# Patient Record
Sex: Female | Born: 1950
Health system: Southern US, Community
[De-identification: ages and names within clinical notes are randomized; demographics above are authoritative.]

## PROBLEM LIST (undated history)

## (undated) DIAGNOSIS — I10 Essential (primary) hypertension: Secondary | ICD-10-CM

## (undated) DIAGNOSIS — K219 Gastro-esophageal reflux disease without esophagitis: Secondary | ICD-10-CM

## (undated) DIAGNOSIS — E785 Hyperlipidemia, unspecified: Secondary | ICD-10-CM

## (undated) DIAGNOSIS — M1990 Unspecified osteoarthritis, unspecified site: Secondary | ICD-10-CM

## (undated) HISTORY — DX: Gastro-esophageal reflux disease without esophagitis: K21.9

## (undated) HISTORY — PX: BILATERAL OOPHORECTOMY: SHX1221

## (undated) HISTORY — PX: APPENDECTOMY: SHX54

## (undated) HISTORY — DX: Essential (primary) hypertension: I10

## (undated) HISTORY — DX: Hyperlipidemia, unspecified: E78.5

## (undated) HISTORY — DX: Unspecified osteoarthritis, unspecified site: M19.90

## (undated) HISTORY — PX: BUNIONECTOMY: SHX129

## (undated) HISTORY — PX: CARPAL TUNNEL RELEASE: SHX101

---

## 1993-12-09 HISTORY — PX: ABDOMINAL HYSTERECTOMY: SHX81

## 2000-07-17 ENCOUNTER — Encounter: Admission: RE | Admit: 2000-07-17 | Discharge: 2000-07-17 | Payer: Self-pay | Admitting: Gynecology

## 2000-07-17 ENCOUNTER — Encounter: Payer: Self-pay | Admitting: Gynecology

## 2000-07-24 ENCOUNTER — Encounter: Admission: RE | Admit: 2000-07-24 | Discharge: 2000-07-24 | Payer: Self-pay | Admitting: Gynecology

## 2000-07-24 ENCOUNTER — Encounter: Payer: Self-pay | Admitting: Gynecology

## 2001-05-09 ENCOUNTER — Encounter (INDEPENDENT_AMBULATORY_CARE_PROVIDER_SITE_OTHER): Payer: Self-pay | Admitting: Internal Medicine

## 2001-09-24 ENCOUNTER — Other Ambulatory Visit: Admission: RE | Admit: 2001-09-24 | Discharge: 2001-09-24 | Payer: Self-pay | Admitting: Gynecology

## 2001-12-23 ENCOUNTER — Encounter: Payer: Self-pay | Admitting: Gynecology

## 2001-12-23 ENCOUNTER — Encounter: Admission: RE | Admit: 2001-12-23 | Discharge: 2001-12-23 | Payer: Self-pay | Admitting: Gynecology

## 2002-07-28 ENCOUNTER — Encounter: Payer: Self-pay | Admitting: Family Medicine

## 2002-07-28 ENCOUNTER — Encounter: Admission: RE | Admit: 2002-07-28 | Discharge: 2002-07-28 | Payer: Self-pay | Admitting: Family Medicine

## 2002-09-28 ENCOUNTER — Other Ambulatory Visit: Admission: RE | Admit: 2002-09-28 | Discharge: 2002-09-28 | Payer: Self-pay | Admitting: Gynecology

## 2003-03-01 ENCOUNTER — Encounter: Payer: Self-pay | Admitting: Gynecology

## 2003-03-01 ENCOUNTER — Encounter: Admission: RE | Admit: 2003-03-01 | Discharge: 2003-03-01 | Payer: Self-pay | Admitting: Gynecology

## 2003-10-27 ENCOUNTER — Other Ambulatory Visit: Admission: RE | Admit: 2003-10-27 | Discharge: 2003-10-27 | Payer: Self-pay | Admitting: Gynecology

## 2003-11-01 ENCOUNTER — Ambulatory Visit (HOSPITAL_COMMUNITY): Admission: RE | Admit: 2003-11-01 | Discharge: 2003-11-01 | Payer: Self-pay | Admitting: Internal Medicine

## 2004-03-06 ENCOUNTER — Encounter: Admission: RE | Admit: 2004-03-06 | Discharge: 2004-03-06 | Payer: Self-pay | Admitting: Gynecology

## 2004-12-09 DIAGNOSIS — K219 Gastro-esophageal reflux disease without esophagitis: Secondary | ICD-10-CM | POA: Insufficient documentation

## 2004-12-25 ENCOUNTER — Other Ambulatory Visit: Admission: RE | Admit: 2004-12-25 | Discharge: 2004-12-25 | Payer: Self-pay | Admitting: Gynecology

## 2005-01-21 ENCOUNTER — Ambulatory Visit: Payer: Self-pay | Admitting: Family Medicine

## 2005-02-06 ENCOUNTER — Ambulatory Visit: Payer: Self-pay | Admitting: Family Medicine

## 2005-03-05 ENCOUNTER — Ambulatory Visit: Payer: Self-pay | Admitting: Family Medicine

## 2005-05-09 ENCOUNTER — Ambulatory Visit: Payer: Self-pay | Admitting: Family Medicine

## 2005-05-09 DIAGNOSIS — E1122 Type 2 diabetes mellitus with diabetic chronic kidney disease: Secondary | ICD-10-CM | POA: Insufficient documentation

## 2005-05-09 DIAGNOSIS — N186 End stage renal disease: Secondary | ICD-10-CM

## 2005-05-24 ENCOUNTER — Encounter (INDEPENDENT_AMBULATORY_CARE_PROVIDER_SITE_OTHER): Payer: Self-pay | Admitting: Nurse Practitioner

## 2005-06-19 ENCOUNTER — Encounter: Admission: RE | Admit: 2005-06-19 | Discharge: 2005-06-19 | Payer: Self-pay | Admitting: Otolaryngology

## 2005-07-10 ENCOUNTER — Encounter (INDEPENDENT_AMBULATORY_CARE_PROVIDER_SITE_OTHER): Payer: Self-pay | Admitting: *Deleted

## 2005-07-10 ENCOUNTER — Encounter: Admission: RE | Admit: 2005-07-10 | Discharge: 2005-07-10 | Payer: Self-pay | Admitting: Otolaryngology

## 2005-07-10 ENCOUNTER — Other Ambulatory Visit: Admission: RE | Admit: 2005-07-10 | Discharge: 2005-07-10 | Payer: Self-pay | Admitting: Interventional Radiology

## 2005-10-24 ENCOUNTER — Encounter: Admission: RE | Admit: 2005-10-24 | Discharge: 2005-10-24 | Payer: Self-pay | Admitting: Gynecology

## 2005-11-22 ENCOUNTER — Ambulatory Visit: Payer: Self-pay | Admitting: Internal Medicine

## 2006-03-21 ENCOUNTER — Encounter: Admission: RE | Admit: 2006-03-21 | Discharge: 2006-03-21 | Payer: Self-pay | Admitting: Otolaryngology

## 2006-05-03 ENCOUNTER — Encounter (INDEPENDENT_AMBULATORY_CARE_PROVIDER_SITE_OTHER): Payer: Self-pay | Admitting: Specialist

## 2006-05-03 ENCOUNTER — Observation Stay (HOSPITAL_COMMUNITY): Admission: EM | Admit: 2006-05-03 | Discharge: 2006-05-04 | Payer: Self-pay | Admitting: Emergency Medicine

## 2006-08-29 ENCOUNTER — Encounter: Admission: RE | Admit: 2006-08-29 | Discharge: 2006-08-29 | Payer: Self-pay | Admitting: Endocrinology

## 2006-10-15 ENCOUNTER — Other Ambulatory Visit: Admission: RE | Admit: 2006-10-15 | Discharge: 2006-10-15 | Payer: Self-pay | Admitting: Gynecology

## 2006-11-11 ENCOUNTER — Encounter: Admission: RE | Admit: 2006-11-11 | Discharge: 2006-11-11 | Payer: Self-pay | Admitting: Gynecology

## 2007-03-04 ENCOUNTER — Encounter: Admission: RE | Admit: 2007-03-04 | Discharge: 2007-03-04 | Payer: Self-pay | Admitting: Otolaryngology

## 2007-07-27 ENCOUNTER — Encounter: Payer: Self-pay | Admitting: Internal Medicine

## 2007-07-27 DIAGNOSIS — E785 Hyperlipidemia, unspecified: Secondary | ICD-10-CM

## 2007-07-27 DIAGNOSIS — D126 Benign neoplasm of colon, unspecified: Secondary | ICD-10-CM | POA: Insufficient documentation

## 2007-07-27 DIAGNOSIS — E1169 Type 2 diabetes mellitus with other specified complication: Secondary | ICD-10-CM | POA: Insufficient documentation

## 2007-07-27 DIAGNOSIS — D649 Anemia, unspecified: Secondary | ICD-10-CM | POA: Insufficient documentation

## 2007-08-06 ENCOUNTER — Ambulatory Visit: Payer: Self-pay | Admitting: Family Medicine

## 2007-08-06 DIAGNOSIS — R5381 Other malaise: Secondary | ICD-10-CM | POA: Insufficient documentation

## 2007-08-06 DIAGNOSIS — R5383 Other fatigue: Secondary | ICD-10-CM | POA: Insufficient documentation

## 2007-08-06 DIAGNOSIS — E041 Nontoxic single thyroid nodule: Secondary | ICD-10-CM | POA: Insufficient documentation

## 2007-08-07 LAB — CONVERTED CEMR LAB
ALT: 39 units/L — ABNORMAL HIGH (ref 0–35)
AST: 37 units/L (ref 0–37)
BUN: 12 mg/dL (ref 6–23)
Basophils Absolute: 0.1 10*3/uL (ref 0.0–0.1)
Basophils Relative: 1 % (ref 0.0–1.0)
Chloride: 102 meq/L (ref 96–112)
Direct LDL: 162.4 mg/dL
Eosinophils Relative: 2.5 % (ref 0.0–5.0)
GFR calc Af Amer: 74 mL/min
GFR calc non Af Amer: 61 mL/min
Glucose, Bld: 109 mg/dL — ABNORMAL HIGH (ref 70–99)
Lymphocytes Relative: 41.2 % (ref 12.0–46.0)
MCHC: 34.5 g/dL (ref 30.0–36.0)
Monocytes Absolute: 0.5 10*3/uL (ref 0.2–0.7)
Monocytes Relative: 7.5 % (ref 3.0–11.0)
Neutro Abs: 3.1 10*3/uL (ref 1.4–7.7)
Potassium: 3.8 meq/L (ref 3.5–5.1)
RBC: 4.31 M/uL (ref 3.87–5.11)
RDW: 13 % (ref 11.5–14.6)
Total Bilirubin: 0.8 mg/dL (ref 0.3–1.2)

## 2007-08-13 ENCOUNTER — Ambulatory Visit: Payer: Self-pay | Admitting: Family Medicine

## 2007-08-25 ENCOUNTER — Encounter (INDEPENDENT_AMBULATORY_CARE_PROVIDER_SITE_OTHER): Payer: Self-pay | Admitting: Internal Medicine

## 2007-09-07 ENCOUNTER — Telehealth (INDEPENDENT_AMBULATORY_CARE_PROVIDER_SITE_OTHER): Payer: Self-pay | Admitting: *Deleted

## 2007-10-01 ENCOUNTER — Ambulatory Visit: Payer: Self-pay | Admitting: Family Medicine

## 2007-10-02 ENCOUNTER — Telehealth (INDEPENDENT_AMBULATORY_CARE_PROVIDER_SITE_OTHER): Payer: Self-pay | Admitting: Internal Medicine

## 2007-10-02 LAB — CONVERTED CEMR LAB
AST: 30 units/L (ref 0–37)
Cholesterol: 181 mg/dL (ref 0–200)
Hgb A1c MFr Bld: 6.5 % — ABNORMAL HIGH (ref 4.6–6.0)
LDL Cholesterol: 106 mg/dL — ABNORMAL HIGH (ref 0–99)
Total CHOL/HDL Ratio: 3.9
Triglycerides: 138 mg/dL (ref 0–149)
VLDL: 28 mg/dL (ref 0–40)

## 2007-10-13 ENCOUNTER — Ambulatory Visit: Payer: Self-pay | Admitting: Family Medicine

## 2007-10-16 ENCOUNTER — Encounter (INDEPENDENT_AMBULATORY_CARE_PROVIDER_SITE_OTHER): Payer: Self-pay | Admitting: Internal Medicine

## 2008-01-05 ENCOUNTER — Ambulatory Visit: Payer: Self-pay | Admitting: Family Medicine

## 2008-01-12 ENCOUNTER — Encounter (INDEPENDENT_AMBULATORY_CARE_PROVIDER_SITE_OTHER): Payer: Self-pay | Admitting: Internal Medicine

## 2008-01-12 LAB — CONVERTED CEMR LAB
Cholesterol: 163 mg/dL (ref 0–200)
HDL: 46.9 mg/dL (ref >39.0)

## 2008-01-15 ENCOUNTER — Encounter: Admission: RE | Admit: 2008-01-15 | Discharge: 2008-01-15 | Payer: Self-pay | Admitting: Otolaryngology

## 2008-01-19 ENCOUNTER — Encounter: Admission: RE | Admit: 2008-01-19 | Discharge: 2008-01-19 | Payer: Self-pay | Admitting: Gynecology

## 2008-04-12 ENCOUNTER — Ambulatory Visit: Payer: Self-pay | Admitting: Family Medicine

## 2008-04-13 LAB — CONVERTED CEMR LAB
ALT: 33 units/L (ref 0–35)
Cholesterol: 154 mg/dL (ref 0–200)
Total CHOL/HDL Ratio: 3.5
VLDL: 24 mg/dL (ref 0–40)

## 2008-06-01 ENCOUNTER — Encounter (INDEPENDENT_AMBULATORY_CARE_PROVIDER_SITE_OTHER): Payer: Self-pay | Admitting: Internal Medicine

## 2008-07-26 ENCOUNTER — Encounter: Admission: RE | Admit: 2008-07-26 | Discharge: 2008-07-26 | Payer: Self-pay | Admitting: Otolaryngology

## 2008-10-11 ENCOUNTER — Ambulatory Visit: Payer: Self-pay | Admitting: Family Medicine

## 2008-10-13 LAB — CONVERTED CEMR LAB
ALT: 26 units/L (ref 0–35)
Bilirubin, Direct: 0.1 mg/dL (ref 0.0–0.3)
Cholesterol: 160 mg/dL (ref 0–200)
HDL: 47.3 mg/dL (ref >39.0)
LDL Cholesterol: 88 mg/dL (ref 0–99)
Total Bilirubin: 0.7 mg/dL (ref 0.3–1.2)
Triglycerides: 125 mg/dL (ref 0–149)

## 2008-12-20 ENCOUNTER — Encounter (INDEPENDENT_AMBULATORY_CARE_PROVIDER_SITE_OTHER): Payer: Self-pay | Admitting: Internal Medicine

## 2009-01-19 ENCOUNTER — Encounter: Admission: RE | Admit: 2009-01-19 | Discharge: 2009-01-19 | Payer: Self-pay | Admitting: Gynecology

## 2009-01-25 ENCOUNTER — Telehealth (INDEPENDENT_AMBULATORY_CARE_PROVIDER_SITE_OTHER): Payer: Self-pay | Admitting: *Deleted

## 2009-04-14 ENCOUNTER — Ambulatory Visit: Payer: Self-pay | Admitting: Family Medicine

## 2009-04-18 LAB — CONVERTED CEMR LAB
AST: 27 units/L (ref 0–37)
Cholesterol: 156 mg/dL (ref 0–200)
HDL: 47.5 mg/dL (ref >39.00)
LDL Cholesterol: 89 mg/dL (ref 0–99)
Total CHOL/HDL Ratio: 3
Triglycerides: 99 mg/dL (ref 0.0–149.0)
VLDL: 19.8 mg/dL (ref 0.0–40.0)

## 2009-04-20 ENCOUNTER — Telehealth (INDEPENDENT_AMBULATORY_CARE_PROVIDER_SITE_OTHER): Payer: Self-pay | Admitting: *Deleted

## 2009-04-26 ENCOUNTER — Telehealth (INDEPENDENT_AMBULATORY_CARE_PROVIDER_SITE_OTHER): Payer: Self-pay | Admitting: *Deleted

## 2009-04-28 ENCOUNTER — Encounter (INDEPENDENT_AMBULATORY_CARE_PROVIDER_SITE_OTHER): Payer: Self-pay | Admitting: Internal Medicine

## 2009-05-10 ENCOUNTER — Telehealth (INDEPENDENT_AMBULATORY_CARE_PROVIDER_SITE_OTHER): Payer: Self-pay | Admitting: *Deleted

## 2009-05-16 ENCOUNTER — Telehealth (INDEPENDENT_AMBULATORY_CARE_PROVIDER_SITE_OTHER): Payer: Self-pay | Admitting: *Deleted

## 2009-05-22 ENCOUNTER — Telehealth (INDEPENDENT_AMBULATORY_CARE_PROVIDER_SITE_OTHER): Payer: Self-pay | Admitting: *Deleted

## 2009-06-19 ENCOUNTER — Encounter: Admission: RE | Admit: 2009-06-19 | Discharge: 2009-06-19 | Payer: Self-pay | Admitting: Internal Medicine

## 2009-06-21 ENCOUNTER — Telehealth: Payer: Self-pay | Admitting: Family Medicine

## 2009-09-11 ENCOUNTER — Encounter: Admission: RE | Admit: 2009-09-11 | Discharge: 2009-09-11 | Payer: Self-pay | Admitting: Otolaryngology

## 2009-10-27 ENCOUNTER — Telehealth (INDEPENDENT_AMBULATORY_CARE_PROVIDER_SITE_OTHER): Payer: Self-pay | Admitting: Internal Medicine

## 2010-05-17 ENCOUNTER — Encounter: Admission: RE | Admit: 2010-05-17 | Discharge: 2010-05-17 | Payer: Self-pay | Admitting: Gynecology

## 2010-05-29 LAB — HM DEXA SCAN

## 2010-12-30 ENCOUNTER — Encounter: Payer: Self-pay | Admitting: Endocrinology

## 2011-04-26 NOTE — Op Note (Signed)
NAMEMarland Kitchen  TAKEYSHA, BONK NO.:  [id number]   MEDICAL RECORD NO.:  [id number]          PATIENT TYPE:  INP   LOCATION:  0102                         FACILITY:  [facility]   PHYSICIAN:  Sandria Bales. Ezzard Standing, M.D.  DATE OF BIRTH:  [birth year]   DATE OF PROCEDURE:  05/03/2006  DATE OF DISCHARGE:                                 OPERATIVE REPORT   PREOPERATIVE DIAGNOSIS:  Appendicitis.   POSTOPERATIVE DIAGNOSIS:  Gangrenous appendicitis.   OPERATION/PROCEDURE:  Laparoscopic appendectomy.   SURGEON:  Sandria Bales. Ezzard Standing, M.D.   FIRST ASSISTANT:  None.   ANESTHESIA:  General endotracheal anesthesia with about 12 mL of 1%  Xylocaine.   COMPLICATIONS:  None.   INDICATIONS FOR PROCEDURE:  [name] is a 60 year old white female who  presented with about a 24-hour history of worsening abdominal pain which was  localized to the right lower quadrant.  CT scan consistent with acute  appendicitis as were her physical exam and labs.  She now comes for a  attempted laparoscopic appendectomy.   She understands the indications and potential risks.  Potential risks  include but are not limited to bleeding, infection, bowel injury, possible  open surgery, and abscess formation later.   DESCRIPTION OF PROCEDURE:  The patient had her left arm tucked and the right  arm out to her side, Foley catheter in place.  Was given a gram of cefoxitin  at the initiation of the procedure.  Her abdomen was prepped with Betadine  solution and sterilely draped.   A 10 mm 0-degree laparoscope was inserted through a 12 mm Hasson trocar in  the infraumbilical incision.  The Hasson trocar was secured with a 0 Vicryl  suture.  A 5 mm Ethicon  trocar was placed in the right upper quadrant and  11 mm [employer]  trocar placed in the left lower quadrant.  The  appendix was identified.  It was stuck up along the right lower quadrant and  was gangrenous and dead_.  The appendix was freed from the abdominal wall.  The mesentery of the appendix taken down with the Harmonic scalpel.  I then  used a endo-GIA 45 mm white load of the stapler which was fired across the  base of the appendix.  The appendix was then delivered into a EndoCatch bag  and delivered through the umbilicus and sent to pathology;   I then reinspected the appendiceal stump which looked good.  I irrigated the  abdomen with about 500 mL of saline, both in the pelvis and on the right  colonic gutter over the liver.  There was no residual purulent material  noted.   I then removed the trocars and determined there was no bleeding at any  trocar site.  The umbilicus trocar was closed with a 0 Vicryl suture.  The  skin at each site was closed with a 5-0 Monocryl suture, painted with  tincture of Benzoin and Steri-stripped.  I used about 12 mL of 0.25%  Marcaine at each trocar site.   The patient tolerated the procedure well and was transported to recovery in  good condition.  Sponge and needle counts were correct.      Sandria Bales. Ezzard Standing, M.D.  Electronically Signed     DHN/MEDQ  D:  05/03/2006  T:  05/05/2006  Job:  [id number]   cc:   Marne A. Tower, M.D. [facility]  [address] [place]  [place] [address]

## 2011-04-26 NOTE — H&P (Signed)
NAME:  Sara Mills, Sara Mills NO.:  [id number]   MEDICAL RECORD NO.:  [id number]          PATIENT TYPE:  EMS   LOCATION:  ED                           FACILITY:  [facility]   PHYSICIAN:  Sandria Bales. Ezzard Standing, M.D.  DATE OF BIRTH:  [birth year]   DATE OF ADMISSION:  05/03/2006  DATE OF DISCHARGE:                                HISTORY & PHYSICAL   HISTORY:  This is a 60 year old white female who has no real significant  past GI history.  She denies history of peptic ulcer disease, liver disease,  gallbladder disease, pancreatic disease.  She had a negative colonoscopy in  2006 by Dr. Ranae Palms.  She has had nausea for about 3 or 4 days, but then  last evening started having abdominal pain which increased and located to  the right lower quadrant.  Came to the [facility] Emergency Room.  She was  seen by Dr. Judithann Sauger who obtained a CT scan which is consistent with  appendicitis.   PAST MEDICAL HISTORY:  She has no allergies.   She is on no medications.   PRIOR OPERATIONS:  1.  She has had 2 C sections and then had a hysterectomy by Dr.  Esperanza Richters in 1995.  2.  She has had carpal tunnel of her right hand by Dr.  Onalee Hua in 2006.  3.  She has had bunions removed from both feet by Dr. Lestine Box in 2004.   REVIEW OF SYSTEMS:  NEUROLOGIC: No history of seizures or loss of  consciousness.  PULMONARY: Denies history of  pneumonia, tuberculosis.  CARDIAC: No heart disease, chest pain, or hypertension.  GASTROINTESTINAL:  See History of Present Illness.  UROLOGIC: No history of kidney stones or kidney infections.   She is accompanied by her husband and daughters in the emergency room.  She  works at [employer] as a Solicitor, I think.   PHYSICAL EXAMINATION:  VITAL SIGNS:  Temperature 97.4, pulse 70, blood  pressure 118/60, respirations 18.  GENERAL: She is a well-nourished, pleasant white female, alert and  cooperative on physical exam.  HEENT:  Unremarkable.  NECK:  Supple.  I feel no mass or thyromegaly, though she says she has a  history of thyroid nodule that has been examined by ultrasound, and I think  the plan is observation.  LUNGS: Clear to auscultation.  HEART: Regular rate and rhythm.  I hear no murmur or rub.  ABDOMEN:  She has bowel sounds, but she has tenderness, guarding, and  rebound in her right lower quadrant suprapubic area.  RECTAL:  I did not do a rectal exam on her.  EXTREMITIES:  She has goo strength to all four extremities.  NEUROLOGIC: Grossly intact to motor and sensory function.   LABORATORY DATA:  Show normal urinalysis.  Sodium 138, potassium 3.3,  chloride 105.  Liver function: Lipase 24. White blood count is 20,300,  hemoglobin 12.7, hematocrit 37.5.   Review of her CT scan shows dilated appendix with periappendiceal  measurement consistent with acute appendicitis.  She has no other  obvious  intra-abdominal problem.   DIAGNOSES:  1.  Acute appendicitis.  Discussed with the patient and her family about      proceeding with appendectomy.  I can usually do about 80% of these      laparoscopically.  The possibility of complications would be open      surgery, bleeding, infection, and a need to resect more than just      appendix as far as bowel.  2.  Thyroid nodules with plan for observation.  3.  History of wrist and foot surgery.  4.  History of hysterectomy.      Sandria Bales. Ezzard Standing, M.D.  Electronically Signed     DHN/MEDQ  D:  05/03/2006  T:  05/03/2006  Job:  [id number]   cc:   Marne A. Tower, M.D. [facility]  [address], [place]  [place] [address]   Bilie Jillyn Hidden, P.A.   Anselmo Rod, M.D.  Fax: [phone]

## 2011-08-29 ENCOUNTER — Other Ambulatory Visit: Payer: Self-pay | Admitting: Gynecology

## 2011-09-04 ENCOUNTER — Other Ambulatory Visit: Payer: Self-pay | Admitting: Gynecology

## 2011-09-04 DIAGNOSIS — Z1231 Encounter for screening mammogram for malignant neoplasm of breast: Secondary | ICD-10-CM

## 2011-09-18 ENCOUNTER — Ambulatory Visit: Payer: Self-pay

## 2011-09-20 LAB — HM MAMMOGRAPHY: HM Mammogram: NORMAL

## 2011-10-01 ENCOUNTER — Ambulatory Visit: Payer: Self-pay

## 2012-03-10 ENCOUNTER — Ambulatory Visit: Payer: Self-pay | Admitting: Internal Medicine

## 2012-03-20 ENCOUNTER — Ambulatory Visit (INDEPENDENT_AMBULATORY_CARE_PROVIDER_SITE_OTHER): Payer: 59 | Admitting: Internal Medicine

## 2012-03-20 ENCOUNTER — Encounter: Payer: Self-pay | Admitting: Internal Medicine

## 2012-03-20 ENCOUNTER — Ambulatory Visit: Payer: Self-pay | Admitting: Internal Medicine

## 2012-03-20 VITALS — BP 130/64 | HR 69 | Temp 98.2°F | Resp 16 | Ht 61.5 in | Wt 172.2 lb

## 2012-03-20 DIAGNOSIS — E28319 Asymptomatic premature menopause: Secondary | ICD-10-CM

## 2012-03-20 DIAGNOSIS — E669 Obesity, unspecified: Secondary | ICD-10-CM

## 2012-03-20 DIAGNOSIS — E785 Hyperlipidemia, unspecified: Secondary | ICD-10-CM

## 2012-03-20 DIAGNOSIS — E119 Type 2 diabetes mellitus without complications: Secondary | ICD-10-CM

## 2012-03-20 DIAGNOSIS — I152 Hypertension secondary to endocrine disorders: Secondary | ICD-10-CM | POA: Insufficient documentation

## 2012-03-20 DIAGNOSIS — Z79890 Hormone replacement therapy (postmenopausal): Secondary | ICD-10-CM

## 2012-03-20 DIAGNOSIS — I10 Essential (primary) hypertension: Secondary | ICD-10-CM

## 2012-03-20 DIAGNOSIS — K219 Gastro-esophageal reflux disease without esophagitis: Secondary | ICD-10-CM | POA: Insufficient documentation

## 2012-03-20 DIAGNOSIS — E041 Nontoxic single thyroid nodule: Secondary | ICD-10-CM

## 2012-03-20 NOTE — Patient Instructions (Addendum)
Consider the Low Glycemic Index Diet and 6 smaller meals daily :   7 AM Low carbohydrate Protein  Shakes (EAS Carb Control  Or Atkins ,  Available everywhere,   In  cases at BJs )  2.5 carbs  (Add or substitute a toasted Arnold's sandwhich thin w/ peanut butter)  10 AM: Protein bar by Atkins (snack size,  Chocolate lover's variety at  BJ's)    Lunch: sandwich on pita bread or flatbread (Joseph's makes a pita bread and a flat bread , available at [employer] and [employer]J's; Toufayah makes a low carb flatbread available at [employer] and [employer]) [employer] and [employer] makes a low carb whole wheat tortilla,  Available everywhere  3 PM:  Mid day :  Another protein bar,  Or a  cheese stick, 1/4 cup of almonds, walnuts, pistachios, pecans, peanuts,  Macadamia nuts  6 PM  Dinner:  "mean and green:"  Meat/chicken/fish, salad, and green veggie : use ranch, vinagrette,  Blue cheese, etc  9 PM snack : Breyer's low carb fudgsicle or  ice cream bar (Carb Smart) Weight Watcher's ice cream bar , or another protein shake  YOU NEED TO TAKE A BABY ASPIRIN  DAILY

## 2012-03-20 NOTE — Assessment & Plan Note (Signed)
Managed with zegerid twice daily initially, now once daily.

## 2012-03-20 NOTE — Progress Notes (Signed)
Patient ID: Sara Mills, female   DOB: 1951/01/05, 61 y.o.   MRN: [id number]   To establish primary care. She is tansferring from [facility], and was last seen in  January . Concern today is polypharmacy and would like to reduce her medications. She denies any intolerance to medications and does have several chronic diseases including high blood pressure high cholesterol gastroesophageal reflux diabetes mellitus depression hypothyroidism and arthritis. Her hemoglobin A1c was 5.9 in January her fasting glucose was 94. She had a fasting lipid panel also done at that time with an LDL of 55 triglycerides of 107 HDL 33.

## 2012-03-20 NOTE — Assessment & Plan Note (Addendum)
hgba1c 5.9 and she has gained weight despite being on metformin.  She is also reporting multiple  GI side  Effects including bloating. We we discussed  suspending metformin and following her symptoms and her weight. Do in the safety issues with him in [place] we will discuss stopping Actos at her next visit and concerned one of the new agents such as Tradjenta if needed.

## 2012-03-22 DIAGNOSIS — E663 Overweight: Secondary | ICD-10-CM | POA: Insufficient documentation

## 2012-03-22 NOTE — Assessment & Plan Note (Signed)
Her hypertension is well controlled on current medications and she has normal renal function by January 2013 labs. No changes today.

## 2012-03-22 NOTE — Assessment & Plan Note (Signed)
She has a history of intolerance due to failed amelioration of undesired symptoms on all but oral medications. Her quality of life is miserable without hormone therapy. We discussed the risks and benefits of hormone therapy and she is willing to continue using estradiol but will start taking an aspirin daily.

## 2012-03-22 NOTE — Assessment & Plan Note (Addendum)
Well controlled, with LDL less than 70, on Crestor and Zetia.[name] No changes today

## 2012-03-22 NOTE — Assessment & Plan Note (Signed)
She was followed by Cain Saupe of [facility] but has not seen an ENT doctor or had her thyroid nodules imaged over one year. Referral done today.

## 2012-03-22 NOTE — Assessment & Plan Note (Signed)
I have addressed  BMI and recommended a low glycemic index diet utilizing smaller more frequent meals to increase metabolism.  I have also recommended that patient start exercising with a goal of 30 minutes of aerobic exercise a minimum of 5 days per week.  

## 2012-04-21 ENCOUNTER — Ambulatory Visit (INDEPENDENT_AMBULATORY_CARE_PROVIDER_SITE_OTHER): Payer: 59 | Admitting: Internal Medicine

## 2012-04-21 ENCOUNTER — Encounter: Payer: Self-pay | Admitting: Internal Medicine

## 2012-04-21 VITALS — BP 124/68 | HR 65 | Temp 98.4°F | Resp 16 | Wt 174.0 lb

## 2012-04-21 DIAGNOSIS — E119 Type 2 diabetes mellitus without complications: Secondary | ICD-10-CM

## 2012-04-21 DIAGNOSIS — N058 Unspecified nephritic syndrome with other morphologic changes: Secondary | ICD-10-CM

## 2012-04-21 DIAGNOSIS — E1122 Type 2 diabetes mellitus with diabetic chronic kidney disease: Secondary | ICD-10-CM

## 2012-04-21 DIAGNOSIS — R053 Chronic cough: Secondary | ICD-10-CM | POA: Insufficient documentation

## 2012-04-21 DIAGNOSIS — E785 Hyperlipidemia, unspecified: Secondary | ICD-10-CM

## 2012-04-21 DIAGNOSIS — N186 End stage renal disease: Secondary | ICD-10-CM

## 2012-04-21 DIAGNOSIS — R059 Cough, unspecified: Secondary | ICD-10-CM

## 2012-04-21 DIAGNOSIS — E1129 Type 2 diabetes mellitus with other diabetic kidney complication: Secondary | ICD-10-CM

## 2012-04-21 DIAGNOSIS — R05 Cough: Secondary | ICD-10-CM

## 2012-04-21 DIAGNOSIS — I10 Essential (primary) hypertension: Secondary | ICD-10-CM

## 2012-04-21 DIAGNOSIS — E669 Obesity, unspecified: Secondary | ICD-10-CM

## 2012-04-21 MED ORDER — LISINOPRIL-HYDROCHLOROTHIAZIDE 20-25 MG PO TABS: 0.5000 | ORAL_TABLET | Freq: Every day | ORAL | Status: DC

## 2012-04-21 NOTE — Patient Instructions (Addendum)
  We are stopping Actos and lisinopril and starting lisinopril/hct for blood pressure and swelling.  Take it in the morning when you first get up.  Return in 2 months for fasting bloodwork.    Try the Diet!!!   For the cough:  Flush your sinuses with Simply Saline twice daily to get the pollen out  Delsym  Is the best cough suppressant on the market   Try OTC allegra or zyrtec

## 2012-04-22 NOTE — Assessment & Plan Note (Signed)
Her blood sugars are well controlled by less hemoglobin A1c. I have discussed with her my concerns over the safety of continuing Actos at this point given the literature suggesting that it causes increased risk for congestive heart failure. We have agreed to stop Actos and she will start the low glycemic index diet for additional control. Repeat hemoglobin A1c is due in 2 months

## 2012-04-22 NOTE — Assessment & Plan Note (Addendum)
Controlled with lisinopril but she is having nocturia and lower chimney edema. Low dose diuretic in a combination pill

## 2012-04-22 NOTE — Progress Notes (Signed)
Patient ID: Sara Mills, female   DOB: 1951/03/31, 61 y.o.   MRN: [id number] Patient Active Problem List  Diagnoses  . COLONIC POLYPS  . THYROID NODULE  . Diabetes mellitus with end stage renal disease  . HYPERLIPIDEMIA  . ANEMIA-NOS  . GERD  . FATIGUE  . Premature menopause on hormone replacement therapy  . GERD (gastroesophageal reflux disease)  . Hyperlipidemia  . Hypertension  . Obesity (BMI 30.0-34.9)  . Cough    Subjective:  CC:   Chief Complaint  Patient presents with  . Follow-up    HPI:   Sara Mills a 61 y.o. female who presents Followup on diabetes hypertension and obesity. Patient was advised to start following a low glycemic index diet at last visit one month ago but has not initiated this diet. She continues to use her medications for diabetes without any side effects and would like to be on fewer medications. She's been having increased conjunctivitis rhinitis and congestion due to the current high pollen count. She is experiencing a dry cough for the last week accompanied by postnasal drip.   Past Medical History  Diagnosis Date  . GERD (gastroesophageal reflux disease)   . Diabetes mellitus   . Hyperlipidemia   . Hypertension     Past Surgical History  Procedure Date  . Bilateral oophorectomy   . Abdominal hysterectomy 1995     and BSO         The following portions of the patient's history were reviewed and updated as appropriate: Allergies, current medications, and problem list.    Review of Systems:   12 Pt  review of systems was negative except those addressed in the HPI,     History   Social History  . Marital Status: Married    Spouse Name: N/A    Number of Children: N/A  . Years of Education: N/A   Occupational History  . Not on file.   Social History Main Topics  . Smoking status: Never Smoker   . Smokeless tobacco: Never Used  . Alcohol Use: No  . Drug Use: No  . Sexually Active: Not on file   Other  Topics Concern  . Not on file   Social History Narrative  . No narrative on file    Objective:  BP 124/68  Pulse 65  Temp(Src) 98.4 F (36.9 C) (Oral)  Resp 16  Wt 174 lb (78.926 kg)  SpO2 96%  General appearance: alert, cooperative and appears stated age Ears: normal TM's and external ear canals both ears Throat: lips, mucosa, and tongue normal; teeth and gums normal Neck: no adenopathy, no carotid bruit, supple, symmetrical, trachea midline and thyroid not enlarged, symmetric, no tenderness/mass/nodules Back: symmetric, no curvature. ROM normal. No CVA tenderness. Lungs: clear to auscultation bilaterally Heart: regular rate and rhythm, S1, S2 normal, no murmur, click, rub or gallop Abdomen: soft, non-tender; bowel sounds normal; no masses,  no organomegaly Pulses: 2+ and symmetric Skin: Skin color, texture, turgor normal. No rashes or lesions Lymph nodes: Cervical, supraclavicular, and axillary nodes normal.  Assessment and Plan:  Diabetes mellitus with end stage renal disease Her blood sugars are well controlled by less hemoglobin A1c. I have discussed with her my concerns over the safety of continuing Actos at this point given the literature suggesting that it causes increased risk for congestive heart failure. We have agreed to stop Actos and she will start the low glycemic index diet for additional control. Repeat hemoglobin A1c is due  in 2 months  Hypertension Controlled with lisinopril but she is having nocturia and lower chimney edema. Low dose diuretic in a combination pill  Obesity (BMI 30.0-34.9) I have addressed  BMI and recommended a low glycemic index diet utilizing smaller more frequent meals to increase metabolism.  I have also recommended that patient start exercising with a goal of 30 minutes of aerobic exercise a minimum of 5 days per week.     Updated Medication List Outpatient Encounter Prescriptions as of 04/21/2012  Medication Sig Dispense Refill    . estrogen-methylTESTOSTERone (ESTRATEST HS) 0.625-1.25 MG per tablet Take 1 tablet by mouth daily.      [name] ezetimibe (ZETIA) 10 MG tablet Take 10 mg by mouth daily.      [name] omeprazole-sodium bicarbonate (ZEGERID) 40-1100 MG per capsule Take 1 capsule by mouth 2 (two) times daily.      . rosuvastatin (CRESTOR) 20 MG tablet Take 20 mg by mouth daily.      [name] zolpidem (AMBIEN) 10 MG tablet Take 10 mg by mouth at bedtime as needed.      [name] DISCONTD: lisinopril (PRINIVIL,ZESTRIL) 10 MG tablet Take 10 mg by mouth daily.      [name] DISCONTD: pioglitazone (ACTOS) 15 MG tablet Take 15 mg by mouth daily.      [name] lisinopril-hydrochlorothiazide (PRINZIDE,ZESTORETIC) 20-25 MG per tablet Take 0.5 tablets by mouth daily.  90 tablet  3     Orders Placed This Encounter  Procedures  . Lipid panel  . COMPLETE METABOLIC PANEL WITH GFR  . Hemoglobin A1c  . Microalbumin / creatinine urine ratio    No Follow-up on file.

## 2012-04-22 NOTE — Assessment & Plan Note (Signed)
I have addressed  BMI and recommended a low glycemic index diet utilizing smaller more frequent meals to increase metabolism.  I have also recommended that patient start exercising with a goal of 30 minutes of aerobic exercise a minimum of 5 days per week.  

## 2012-05-20 ENCOUNTER — Ambulatory Visit: Payer: Self-pay | Admitting: Internal Medicine

## 2012-06-23 ENCOUNTER — Other Ambulatory Visit (INDEPENDENT_AMBULATORY_CARE_PROVIDER_SITE_OTHER): Payer: 59 | Admitting: *Deleted

## 2012-06-23 DIAGNOSIS — N186 End stage renal disease: Secondary | ICD-10-CM

## 2012-06-23 DIAGNOSIS — E1129 Type 2 diabetes mellitus with other diabetic kidney complication: Secondary | ICD-10-CM

## 2012-06-23 DIAGNOSIS — E785 Hyperlipidemia, unspecified: Secondary | ICD-10-CM

## 2012-06-23 LAB — LIPID PANEL
Cholesterol: 113 mg/dL (ref 0–200)
LDL Cholesterol: 49 mg/dL (ref 0–99)
VLDL: 23.6 mg/dL (ref 0.0–40.0)

## 2012-06-23 LAB — MICROALBUMIN / CREATININE URINE RATIO
Creatinine,U: 169.6 mg/dL
Microalb Creat Ratio: 0.2 mg/g (ref 0.0–30.0)

## 2012-06-24 LAB — COMPLETE METABOLIC PANEL WITH GFR
AST: 22 U/L (ref 0–37)
Albumin: 4.4 g/dL (ref 3.5–5.2)
Alkaline Phosphatase: 59 U/L (ref 39–117)
BUN: 18 mg/dL (ref 6–23)
Potassium: 4 mEq/L (ref 3.5–5.3)
Sodium: 138 mEq/L (ref 135–145)

## 2012-08-06 ENCOUNTER — Other Ambulatory Visit: Payer: Self-pay | Admitting: Internal Medicine

## 2012-08-06 NOTE — Telephone Encounter (Signed)
Patient needs her estrogen and zolpridem called into [facility] on [address]. In [place].  Please advise her when they have been called in.

## 2012-08-07 MED ORDER — ZOLPIDEM TARTRATE 10 MG PO TABS: 10.0000 mg | ORAL_TABLET | Freq: Every evening | ORAL | Status: DC | PRN

## 2012-08-07 MED ORDER — EST ESTROGENS-METHYLTEST 0.625-1.25 MG PO TABS: 1.0000 | ORAL_TABLET | Freq: Every day | ORAL | Status: DC

## 2012-08-07 NOTE — Telephone Encounter (Signed)
Have tried calling in 4 times since 5:30 and pharmacy line has been busy. Will try calling again later.

## 2012-08-07 NOTE — Telephone Encounter (Signed)
Please call patient and pharmacy,  The zolpidem  must be phoned in.  I will approve both rxs' but zolpidem has to be phoned in

## 2012-08-11 NOTE — Telephone Encounter (Signed)
Medication phone in.

## 2012-08-25 ENCOUNTER — Encounter: Payer: Self-pay | Admitting: Internal Medicine

## 2012-08-25 ENCOUNTER — Ambulatory Visit (INDEPENDENT_AMBULATORY_CARE_PROVIDER_SITE_OTHER): Payer: 59 | Admitting: Internal Medicine

## 2012-08-25 VITALS — BP 102/70 | HR 61 | Temp 98.6°F | Resp 16 | Wt 185.0 lb

## 2012-08-25 DIAGNOSIS — R059 Cough, unspecified: Secondary | ICD-10-CM

## 2012-08-25 DIAGNOSIS — E669 Obesity, unspecified: Secondary | ICD-10-CM

## 2012-08-25 DIAGNOSIS — E119 Type 2 diabetes mellitus without complications: Secondary | ICD-10-CM

## 2012-08-25 DIAGNOSIS — E785 Hyperlipidemia, unspecified: Secondary | ICD-10-CM

## 2012-08-25 DIAGNOSIS — E1169 Type 2 diabetes mellitus with other specified complication: Secondary | ICD-10-CM

## 2012-08-25 DIAGNOSIS — R05 Cough: Secondary | ICD-10-CM

## 2012-08-25 DIAGNOSIS — I10 Essential (primary) hypertension: Secondary | ICD-10-CM

## 2012-08-25 NOTE — Patient Instructions (Addendum)
Resume the metformin at 500 mg twice daily  Resume zegerid or prevacid (take 20 minutes  Before you eat)  GET ON THE LOW CARB DIET!!!!!!!

## 2012-08-25 NOTE — Progress Notes (Signed)
Patient ID: Sara Mills, female   DOB: May 10, 1951, 61 y.o.   MRN: [id number]  Patient Active Problem List  Diagnosis  . COLONIC POLYPS  . THYROID NODULE  . HYPERLIPIDEMIA  . ANEMIA-NOS  . GERD  . FATIGUE  . Premature menopause on hormone replacement therapy  . GERD (gastroesophageal reflux disease)  . Hyperlipidemia  . Hypertension  . Obesity (BMI 30.0-34.9)  . Cough  . Diabetes mellitus type 2 in obese    Subjective:  CC:   Chief Complaint  Patient presents with  . Follow-up    HPI:   Sara Mills a 61 y.o. female who presents Three-month followup on diabetes . At last visit she was advised to stop the Actos due to safety concerns. She was advised to follow a low glycemic index diet and return for repeat testing. In the interim she has mistakenly stopped both Actos and metformin. She has not followed a low glycemic index diet. She has not started an exercise program. She has been having constipation and using a glycerin suppository every other day to achieve stooling and averages 3-4 times per week. She has not tried daily use of a fiber supplement such as MiraLAX. She has no blood in her stools.    Past Medical History  Diagnosis Date  . GERD (gastroesophageal reflux disease)   . Diabetes mellitus   . Hyperlipidemia   . Hypertension     Past Surgical History  Procedure Date  . Bilateral oophorectomy   . Abdominal hysterectomy 1995     and BSO         The following portions of the patient's history were reviewed and updated as appropriate: Allergies, current medications, and problem list.    Review of Systems:   12 Pt  review of systems was negative except those addressed in the HPI,     History   Social History  . Marital Status: Married    Spouse Name: N/A    Number of Children: N/A  . Years of Education: N/A   Occupational History  . Not on file.   Social History Main Topics  . Smoking status: Never Smoker   . Smokeless tobacco:  Never Used  . Alcohol Use: No  . Drug Use: No  . Sexually Active: Not on file   Other Topics Concern  . Not on file   Social History Narrative  . No narrative on file    Objective:  BP 102/70  Pulse 61  Temp 98.6 F (37 C) (Oral)  Resp 16  Wt 185 lb (83.915 kg)  SpO2 97%  General appearance: alert, cooperative and appears stated age Ears: normal TM's and external ear canals both ears Throat: lips, mucosa, and tongue normal; teeth and gums normal Neck: no adenopathy, no carotid bruit, supple, symmetrical, trachea midline and thyroid not enlarged, symmetric, no tenderness/mass/nodules Back: symmetric, no curvature. ROM normal. No CVA tenderness. Lungs: clear to auscultation bilaterally Heart: regular rate and rhythm, S1, S2 normal, no murmur, click, rub or gallop Abdomen: soft, non-tender; bowel sounds normal; no masses,  no organomegaly Pulses: 2+ and symmetric Skin: Skin color, texture, turgor normal. No rashes or lesions Lymph nodes: Cervical, supraclavicular, and axillary nodes normal.  Assessment and Plan: Cough Normal acid p 24 hr probe.  Normal EGD,  normal chest x-ray. Her symptoms are likely due to inhaled particulate matter at the place of work. Usually more throat clearing cough. For this reason we did not stop her ACE inhibitor.  Diabetes mellitus type 2 in obese Without medication she has lost control diabetes completely. Hemoglobin A 1C i was 7.6 in July. We will resume metformin twice daily and she is now motivated to start a low glycemic index diet. If her repeat hemoglobin A1c in January is still above 7.0 we will add Glipizide.  Hypertension well controlled on current medications. Renal function is normal. No changes today.   Hyperlipidemia Recent LDL was 49 on current dose of Crestor. Liver function tests are normal. No medication changes today.   Updated Medication List Outpatient Encounter Prescriptions as of 08/25/2012  Medication Sig Dispense  Refill  . estrogen-methylTESTOSTERone 0.625-1.25 MG per tablet Take 1 tablet by mouth daily.  30 tablet  3  . ezetimibe (ZETIA) 10 MG tablet Take 10 mg by mouth daily.      [name] lisinopril-hydrochlorothiazide (PRINZIDE,ZESTORETIC) 20-25 MG per tablet Take 0.5 tablets by mouth daily.  90 tablet  3  . rosuvastatin (CRESTOR) 20 MG tablet Take 20 mg by mouth daily.      [name] zolpidem (AMBIEN) 10 MG tablet Take 1 tablet (10 mg total) by mouth at bedtime as needed.  30 tablet  3  . DISCONTD: omeprazole-sodium bicarbonate (ZEGERID) 40-1100 MG per capsule Take 1 capsule by mouth 2 (two) times daily.

## 2012-08-25 NOTE — Assessment & Plan Note (Addendum)
Normal acid p 24 hr probe.  Normal EGD,  normal chest x-ray. Her symptoms are likely due to inhaled particulate matter at the place of work. Usually more throat clearing cough. For this reason we did not stop her ACE inhibitor.

## 2012-08-26 ENCOUNTER — Encounter: Payer: Self-pay | Admitting: Internal Medicine

## 2012-08-26 DIAGNOSIS — E1169 Type 2 diabetes mellitus with other specified complication: Secondary | ICD-10-CM | POA: Insufficient documentation

## 2012-08-26 DIAGNOSIS — E669 Obesity, unspecified: Secondary | ICD-10-CM | POA: Insufficient documentation

## 2012-08-26 NOTE — Assessment & Plan Note (Signed)
well controlled on current medications. Renal function is normal. No changes today.

## 2012-08-26 NOTE — Assessment & Plan Note (Addendum)
Without medication she has lost control diabetes completely. Hemoglobin A 1C i was 7.6 in July. We will resume metformin twice daily and she is now motivated to start a low glycemic index diet. If her repeat hemoglobin A1c in January is still above 7.0 we will add Glipizide.

## 2012-08-26 NOTE — Assessment & Plan Note (Signed)
Recent LDL was 49 on current dose of Crestor. Liver function tests are normal. No medication changes today.

## 2012-09-29 LAB — HM DIABETES EYE EXAM: HM Diabetic Eye Exam: NORMAL

## 2012-10-27 LAB — HM DIABETES FOOT EXAM: HM Diabetic Foot Exam: NORMAL

## 2012-10-30 LAB — HM MAMMOGRAPHY: HM Mammogram: NORMAL

## 2012-12-16 ENCOUNTER — Other Ambulatory Visit: Payer: 59

## 2012-12-22 ENCOUNTER — Ambulatory Visit: Payer: 59 | Admitting: Internal Medicine

## 2013-01-14 ENCOUNTER — Other Ambulatory Visit: Payer: 59

## 2013-01-18 ENCOUNTER — Other Ambulatory Visit: Payer: 59

## 2013-01-19 ENCOUNTER — Telehealth: Payer: Self-pay | Admitting: *Deleted

## 2013-01-19 DIAGNOSIS — IMO0002 Reserved for concepts with insufficient information to code with codable children: Secondary | ICD-10-CM

## 2013-01-19 DIAGNOSIS — E1165 Type 2 diabetes mellitus with hyperglycemia: Secondary | ICD-10-CM

## 2013-01-19 NOTE — Telephone Encounter (Signed)
Orders in 

## 2013-01-19 NOTE — Telephone Encounter (Signed)
Pt is coming in for labs tomorrow 02.12.2014 what labs and dx would you like? Thank you

## 2013-01-19 NOTE — Addendum Note (Signed)
Addended by: Sherlene Shams on: 01/19/2013 12:29 PM   Modules accepted: Orders

## 2013-01-20 ENCOUNTER — Other Ambulatory Visit (INDEPENDENT_AMBULATORY_CARE_PROVIDER_SITE_OTHER): Payer: 59

## 2013-01-20 DIAGNOSIS — E1165 Type 2 diabetes mellitus with hyperglycemia: Secondary | ICD-10-CM

## 2013-01-20 DIAGNOSIS — IMO0001 Reserved for inherently not codable concepts without codable children: Secondary | ICD-10-CM

## 2013-01-20 DIAGNOSIS — IMO0002 Reserved for concepts with insufficient information to code with codable children: Secondary | ICD-10-CM

## 2013-01-20 LAB — COMPREHENSIVE METABOLIC PANEL
ALT: 32 U/L (ref 0–35)
Alkaline Phosphatase: 57 U/L (ref 39–117)
CO2: 30 mEq/L (ref 19–32)
Creatinine, Ser: 1 mg/dL (ref 0.4–1.2)
GFR: 60.92 mL/min (ref >60.00)
Total Bilirubin: 0.5 mg/dL (ref 0.3–1.2)

## 2013-01-20 LAB — MICROALBUMIN / CREATININE URINE RATIO
Microalb Creat Ratio: 0.5 mg/g (ref 0.0–30.0)
Microalb, Ur: 0.9 mg/dL (ref 0.0–1.9)

## 2013-01-20 LAB — LIPID PANEL
Total CHOL/HDL Ratio: 3
Triglycerides: 157 mg/dL — ABNORMAL HIGH (ref 0.0–149.0)

## 2013-01-27 ENCOUNTER — Encounter: Payer: Self-pay | Admitting: Internal Medicine

## 2013-01-27 ENCOUNTER — Ambulatory Visit (INDEPENDENT_AMBULATORY_CARE_PROVIDER_SITE_OTHER): Payer: 59 | Admitting: Internal Medicine

## 2013-01-27 VITALS — BP 110/70 | HR 64 | Temp 98.2°F | Resp 16 | Wt 167.5 lb

## 2013-01-27 DIAGNOSIS — E669 Obesity, unspecified: Secondary | ICD-10-CM

## 2013-01-27 DIAGNOSIS — E041 Nontoxic single thyroid nodule: Secondary | ICD-10-CM

## 2013-01-27 DIAGNOSIS — E1169 Type 2 diabetes mellitus with other specified complication: Secondary | ICD-10-CM

## 2013-01-27 DIAGNOSIS — E785 Hyperlipidemia, unspecified: Secondary | ICD-10-CM

## 2013-01-27 DIAGNOSIS — E119 Type 2 diabetes mellitus without complications: Secondary | ICD-10-CM

## 2013-01-27 DIAGNOSIS — D649 Anemia, unspecified: Secondary | ICD-10-CM

## 2013-01-27 LAB — CBC WITH DIFFERENTIAL/PLATELET
Basophils Absolute: 0 10*3/uL (ref 0.0–0.1)
Basophils Relative: 0.6 % (ref 0.0–3.0)
Eosinophils Relative: 3.1 % (ref 0.0–5.0)
HCT: 34.7 % — ABNORMAL LOW (ref 36.0–46.0)
Hemoglobin: 11.6 g/dL — ABNORMAL LOW (ref 12.0–15.0)
Lymphocytes Relative: 31.6 % (ref 12.0–46.0)
Lymphs Abs: 1.8 10*3/uL (ref 0.7–4.0)
Monocytes Relative: 9.8 % (ref 3.0–12.0)
Neutro Abs: 3.1 10*3/uL (ref 1.4–7.7)
RBC: 4.13 Mil/uL (ref 3.87–5.11)
RDW: 14.4 % (ref 11.5–14.6)

## 2013-01-27 LAB — HM DIABETES FOOT EXAM: HM Diabetic Foot Exam: NORMAL

## 2013-01-27 LAB — IRON AND TIBC
%SAT: 14 % — ABNORMAL LOW (ref 20–55)
TIBC: 362 ug/dL (ref 250–470)

## 2013-01-27 LAB — FERRITIN: Ferritin: 16.3 ng/mL (ref 10.0–291.0)

## 2013-01-27 LAB — VITAMIN B12: Vitamin B-12: 409 pg/mL (ref 211–911)

## 2013-01-27 MED ORDER — EST ESTROGENS-METHYLTEST 0.625-1.25 MG PO TABS: 1.0000 | ORAL_TABLET | Freq: Every day | ORAL | Status: DC

## 2013-01-27 MED ORDER — METFORMIN HCL 1000 MG PO TABS: 500.0000 mg | ORAL_TABLET | Freq: Two times a day (BID) | ORAL | Status: DC

## 2013-01-27 NOTE — Progress Notes (Signed)
Patient ID: Sara Mills, female   DOB: Jan 09, 1951, 62 y.o.   MRN: [id number]   Patient Active Problem List  Diagnosis  . COLONIC POLYPS  . THYROID NODULE  . HYPERLIPIDEMIA  . ANEMIA-NOS  . GERD  . FATIGUE  . Premature menopause on hormone replacement therapy  . Hyperlipidemia  . Hypertension  . Obesity (BMI 30.0-34.9)  . Diabetes mellitus type 2 in obese    Subjective:  CC:   Chief Complaint  Patient presents with  . Follow-up    HPI:   Sara Mills a 62 y.o. female who presents follow up on diabetes, hyperlipidemia, hypertension and obesity.  Her cc is a visual phenomenom that is chronic,  She states that for over a year she has been seeing  Blue haloe effect around everything.  She has been to 5 different  ophthalmoloigists for evaluation, including a referral to a [facility] neuroopthalmologist and no cause has been found. She wonders if it is a drug effect. 2) obesity:  She has lost 18 lbs since her last visit in September and has lowered her A1c from 7.60 yo 7.2 . Her cholesterol has improveda as well with a rise in Hdl to 45 and LDL still low at 57.   Past Medical History  Diagnosis Date  . GERD (gastroesophageal reflux disease)   . Diabetes mellitus   . Hyperlipidemia   . Hypertension     Past Surgical History  Procedure Laterality Date  . Bilateral oophorectomy    . Abdominal hysterectomy  1995     and BSO       The following portions of the patient's history were reviewed and updated as appropriate: Allergies, current medications, and problem list.    Review of Systems:   Patient denies headache, fevers, malaise, unintentional weight loss, skin rash, eye pain, sinus congestion and sinus pain, sore throat, dysphagia,  hemoptysis , cough, dyspnea, wheezing, chest pain, palpitations, orthopnea, edema, abdominal pain, nausea, melena, diarrhea, constipation, flank pain, dysuria, hematuria, urinary  Frequency, nocturia, numbness, tingling, seizures,  Focal  weakness, Loss of consciousness,  Tremor, insomnia, depression, anxiety, and suicidal ideation.     History   Social History  . Marital Status: Married    Spouse Name: N/A    Number of Children: N/A  . Years of Education: N/A   Occupational History  . Not on file.   Social History Main Topics  . Smoking status: Never Smoker   . Smokeless tobacco: Never Used  . Alcohol Use: No  . Drug Use: No  . Sexually Active: Not on file   Other Topics Concern  . Not on file   Social History Narrative  . No narrative on file    Objective:  BP 110/70  Pulse 64  Temp(Src) 98.2 F (36.8 C) (Oral)  Resp 16  Wt 167 lb 8 oz (75.978 kg)  BMI 31.14 kg/m2  SpO2 97%  General appearance: alert, cooperative and appears stated age Ears: normal TM's and external ear canals both ears Throat: lips, mucosa, and tongue normal; teeth and gums normal Neck: no adenopathy, no carotid bruit, supple, symmetrical, trachea midline and thyroid not enlarged, symmetric, no tenderness/mass/nodules Back: symmetric, no curvature. ROM normal. No CVA tenderness. Lungs: clear to auscultation bilaterally Heart: regular rate and rhythm, S1, S2 normal, no murmur, click, rub or gallop Abdomen: soft, non-tender; bowel sounds normal; no masses,  no organomegaly Pulses: 2+ and symmetric Skin: Skin color, texture, turgor normal. No rashes or lesions Lymph nodes:  Cervical, supraclavicular, and axillary nodes normal. Foot exam:  Nails are well trimmed,  No callouses,  Sensation intact to microfilament  Assessment and Plan:  Diabetes mellitus type 2 in obese We resumed metformin twice daily in September , she started a low glycemic index diet, and has lowered her a1c to 7.2 and lost 18 lbs.,  No changes today. Up to date on foot , eye  And proteinuria exams.  LDL at goal.    HYPERLIPIDEMIA Well controlled on current regimen.Liver enzymes are normal.  Given her visual phenomenoma Will stop Zetia and  Crestor for 3  month trial  ANEMIA-NOS Mild, hgb 11.6  .  iron and b12 studies done,  TIBC is normal, iron saturation slightly low.  No indication for iron supplements. Up to date on colon ca screening and not menstruating.   THYROID NODULE In reviewing her chart referral to [facility] was done in April 2089for follow up,  but no records available so it is not clear whether the referral actually happened.     Updated Medication List Outpatient Encounter Prescriptions as of 01/27/2013  Medication Sig Dispense Refill  . estrogen-methylTESTOSTERone 0.625-1.25 MG per tablet Take 1 tablet by mouth daily.  30 tablet  3  . lisinopril-hydrochlorothiazide (PRINZIDE,ZESTORETIC) 20-25 MG per tablet Take 0.5 tablets by mouth daily.  90 tablet  3  . metFORMIN (GLUCOPHAGE) 1000 MG tablet Take 0.5 tablets (500 mg total) by mouth 2 (two) times daily with a meal.  60 tablet  11  . zolpidem (AMBIEN) 10 MG tablet Take 1 tablet (10 mg total) by mouth at bedtime as needed.  30 tablet  3  . [DISCONTINUED] estrogen-methylTESTOSTERone 0.625-1.25 MG per tablet Take 1 tablet by mouth daily.  30 tablet  3  . [DISCONTINUED] ezetimibe (ZETIA) 10 MG tablet Take 10 mg by mouth daily.      . [DISCONTINUED] metFORMIN (GLUCOPHAGE) 1000 MG tablet Take 500 mg by mouth 2 (two) times daily with a meal.       . [DISCONTINUED] rosuvastatin (CRESTOR) 20 MG tablet Take 20 mg by mouth daily.       No facility-administered encounter medications on file as of 01/27/2013.     Orders Placed This Encounter  Procedures  . HM MAMMOGRAPHY  . HM MAMMOGRAPHY  . CBC with Differential  . Iron and TIBC  . Ferritin  . Vitamin B12  . Hemoglobin A1c  . Comprehensive metabolic panel  . TSH  . Lipid panel  . Ambulatory referral to ENT  . HM DIABETES FOOT EXAM  . HM DIABETES EYE EXAM    No Follow-up on file.

## 2013-01-27 NOTE — Patient Instructions (Addendum)
Stop the zetia and crestor for a maximum of 3 months to see if vision issues improve.   Continue the metformin   Iron studies today

## 2013-01-28 ENCOUNTER — Encounter: Payer: Self-pay | Admitting: Internal Medicine

## 2013-01-30 ENCOUNTER — Encounter: Payer: Self-pay | Admitting: Internal Medicine

## 2013-01-30 NOTE — Assessment & Plan Note (Signed)
Mild, hgb 11.6  .  iron and b12 studies done,  TIBC is normal, iron saturation slightly low.  No indication for iron supplements. Up to date on colon ca screening and not menstruating.

## 2013-01-30 NOTE — Assessment & Plan Note (Addendum)
Well controlled on current regimen.Liver enzymes are normal.  Given her visual phenomenoma Will stop Zetia and  Crestor for 3 month trial

## 2013-01-30 NOTE — Assessment & Plan Note (Signed)
We resumed metformin twice daily in September , she started a low glycemic index diet, and has lowered her a1c to 7.2 and lost 18 lbs.,  No changes today. Up to date on foot , eye  And proteinuria exams.  LDL at goal.

## 2013-01-30 NOTE — Assessment & Plan Note (Addendum)
In reviewing her chart referral to [facility] was done in April 209for follow up,  but no records available so it is not clear whether the referral actually happened.

## 2013-03-02 ENCOUNTER — Encounter (INDEPENDENT_AMBULATORY_CARE_PROVIDER_SITE_OTHER): Payer: Self-pay

## 2013-03-09 ENCOUNTER — Encounter (INDEPENDENT_AMBULATORY_CARE_PROVIDER_SITE_OTHER): Payer: Self-pay | Admitting: General Surgery

## 2013-03-09 ENCOUNTER — Ambulatory Visit (INDEPENDENT_AMBULATORY_CARE_PROVIDER_SITE_OTHER): Payer: 59 | Admitting: General Surgery

## 2013-03-09 VITALS — BP 120/82 | HR 60 | Temp 96.4°F | Ht 61.5 in | Wt 164.4 lb

## 2013-03-09 DIAGNOSIS — K219 Gastro-esophageal reflux disease without esophagitis: Secondary | ICD-10-CM

## 2013-03-09 NOTE — Patient Instructions (Signed)
If you decide you would like to have surgery, we will need to arrange for you to get a pH probe study and manometry study.

## 2013-03-09 NOTE — Progress Notes (Signed)
Patient ID: Sara Mills, female   DOB: 1951-03-14, 62 y.o.   MRN: [id number]  Chief Complaint  Patient presents with  . New Evaluation    eval Lap Nissen    HPI Sara Mills is a 62 y.o. female.   HPI  She is referred by Dr. Jenne Pane for evaluation of Nissen fundoplication to treat gastroesophageal reflux disease. She has had gastroesophageal reflux disease for a number of years. Most her her symptoms involve coarseness, a globus feeling, and need to clear her throat. She does not have classic heartburn-type symptoms. She also noted a slimy sensation in her mouth. When she was placed on proton pump inhibitors this improved. In June of 2013 she underwent an upper endoscopy and placement of a pH probe  by Dr. Loreta Ave.  This did not demonstrate any significant esophagitis. The DeMeester score was 12.9 which is within normal limits.  She could not recall whether she had stopped her proton pump inhibitor are not but she thought she had.    Past Medical History  Diagnosis Date  . GERD (gastroesophageal reflux disease)   . Diabetes mellitus   . Hyperlipidemia   . Hypertension     Past Surgical History  Procedure Laterality Date  . Bilateral oophorectomy    . Cesarean section  1977 and 1978  . Carpal tunnel release Right   . Abdominal hysterectomy  1995     and BSO  . Appendectomy      Family History  Problem Relation Age of Onset  . Stroke Mother   . Cancer Father 33    carcinoid syndrome    Social History History  Substance Use Topics  . Smoking status: Never Smoker   . Smokeless tobacco: Never Used  . Alcohol Use: No    No Known Allergies  Current Outpatient Prescriptions  Medication Sig Dispense Refill  . estrogen-methylTESTOSTERone 0.625-1.25 MG per tablet Take 1 tablet by mouth daily.  30 tablet  3  . lisinopril-hydrochlorothiazide (PRINZIDE,ZESTORETIC) 20-25 MG per tablet Take 0.5 tablets by mouth daily.  90 tablet  3  . metFORMIN (GLUCOPHAGE) 1000 MG tablet Take 0.5  tablets (500 mg total) by mouth 2 (two) times daily with a meal.  60 tablet  11  . omeprazole (PRILOSEC) 40 MG capsule       . rosuvastatin (CRESTOR) 20 MG tablet Take 20 mg by mouth daily.      [name] zolpidem (AMBIEN) 10 MG tablet Take 1 tablet (10 mg total) by mouth at bedtime as needed.  30 tablet  3   No current facility-administered medications for this visit.    Review of Systems Review of Systems  Constitutional: Positive for fatigue.  HENT: Positive for postnasal drip.        Hoarseness  Respiratory: Negative.   Cardiovascular: Negative.   Gastrointestinal: Negative for vomiting.  Genitourinary: Negative.   Allergic/Immunologic: Negative.   Neurological: Negative.     Blood pressure 120/82, pulse 60, temperature 96.4 F (35.8 C), temperature source Temporal, height 5' 1.5" (1.562 m), weight 164 lb 6.4 oz (74.571 kg).  Physical Exam Physical Exam  Constitutional: No distress.  Overweight female  HENT:  Head: Normocephalic and atraumatic.  Mouth/Throat: Oropharynx is clear and moist.  Eyes: EOM are normal. No scleral icterus.  Neck: Neck supple.  Cardiovascular: Normal rate and regular rhythm.   Pulmonary/Chest: Effort normal and breath sounds normal.  Abdominal: Soft. She exhibits no distension and no mass. There is no tenderness.  Musculoskeletal: Normal range  of motion.  Lymphadenopathy:    She has no cervical adenopathy.  Neurological: She is alert.  Skin: Skin is warm and dry.  Psychiatric: She has a normal mood and affect. Her behavior is normal.    Data Reviewed Notes from Dr. Jenne Pane. Notes from Dr. Loreta Ave.  Assessment    Supraesophageal manifestations from gastroesophageal reflux disease although the pH probe study was within normal limits 9 months ago. Her symptoms  improve with a proton pump inhibitor. She has some postnasal drip which might be also contributing slightly to this.     Plan    I discussed the laparoscopic Nissen fundoplication with her and  her husband. We went over the procedure, risks, aftercare, lifestyle changes. If she was interested in having this done, I would like to repeat the pH probe study off medication as well as try manometry, or if this was not tolerated, upper GI. At this time, she is not interested in pursuing surgery for this problem as she feels she is fairly well controlled with medication. I told her if she changed her mind please let us know.        Ruel Dimmick J 03/09/2013, 1:57 PM

## 2013-04-09 ENCOUNTER — Encounter (INDEPENDENT_AMBULATORY_CARE_PROVIDER_SITE_OTHER): Payer: Self-pay

## 2013-06-15 ENCOUNTER — Other Ambulatory Visit: Payer: Self-pay | Admitting: *Deleted

## 2013-06-15 DIAGNOSIS — E785 Hyperlipidemia, unspecified: Secondary | ICD-10-CM

## 2013-06-15 NOTE — Telephone Encounter (Signed)
Does patient need labs dated 2/14 for further refills or okay to refill please advise crestor

## 2013-06-16 MED ORDER — ROSUVASTATIN CALCIUM 20 MG PO TABS: 20.0000 mg | ORAL_TABLET | Freq: Every day | ORAL | Status: DC

## 2013-06-16 NOTE — Addendum Note (Signed)
Addended by: Sherlene Shams on: 06/16/2013 01:46 PM   Modules accepted: Orders

## 2013-06-16 NOTE — Telephone Encounter (Signed)
One month,.  Needs fastign labs doen prier to any more

## 2013-06-17 NOTE — Telephone Encounter (Signed)
Patient informed and scheduled an appointment for labs

## 2013-07-01 ENCOUNTER — Other Ambulatory Visit: Payer: Self-pay | Admitting: *Deleted

## 2013-07-01 MED ORDER — EST ESTROGENS-METHYLTEST 0.625-1.25 MG PO TABS: 1.0000 | ORAL_TABLET | Freq: Every day | ORAL | Status: DC

## 2013-07-08 ENCOUNTER — Other Ambulatory Visit: Payer: 59

## 2013-07-13 ENCOUNTER — Other Ambulatory Visit (INDEPENDENT_AMBULATORY_CARE_PROVIDER_SITE_OTHER): Payer: 59

## 2013-07-13 DIAGNOSIS — E119 Type 2 diabetes mellitus without complications: Secondary | ICD-10-CM

## 2013-07-13 DIAGNOSIS — E785 Hyperlipidemia, unspecified: Secondary | ICD-10-CM

## 2013-07-13 DIAGNOSIS — E041 Nontoxic single thyroid nodule: Secondary | ICD-10-CM

## 2013-07-13 DIAGNOSIS — E669 Obesity, unspecified: Secondary | ICD-10-CM

## 2013-07-13 DIAGNOSIS — E1169 Type 2 diabetes mellitus with other specified complication: Secondary | ICD-10-CM

## 2013-07-13 LAB — COMPREHENSIVE METABOLIC PANEL
Albumin: 4.1 g/dL (ref 3.5–5.2)
BUN: 14 mg/dL (ref 6–23)
Calcium: 9.1 mg/dL (ref 8.4–10.5)
Chloride: 105 mEq/L (ref 96–112)
Glucose, Bld: 105 mg/dL — ABNORMAL HIGH (ref 70–99)
Potassium: 4.3 mEq/L (ref 3.5–5.1)

## 2013-07-13 LAB — LIPID PANEL: Cholesterol: 137 mg/dL (ref 0–200)

## 2013-07-13 LAB — TSH: TSH: 1.98 u[IU]/mL (ref 0.35–5.50)

## 2013-07-15 ENCOUNTER — Encounter: Payer: Self-pay | Admitting: Internal Medicine

## 2013-07-19 NOTE — Telephone Encounter (Signed)
Mailed unread message to pt  

## 2013-08-27 ENCOUNTER — Encounter: Payer: Self-pay | Admitting: Adult Health

## 2013-08-27 ENCOUNTER — Ambulatory Visit (INDEPENDENT_AMBULATORY_CARE_PROVIDER_SITE_OTHER): Payer: 59 | Admitting: Adult Health

## 2013-08-27 VITALS — BP 132/86 | HR 60 | Temp 97.8°F | Resp 12 | Ht 61.5 in | Wt 170.5 lb

## 2013-08-27 DIAGNOSIS — H938X9 Other specified disorders of ear, unspecified ear: Secondary | ICD-10-CM

## 2013-08-27 DIAGNOSIS — H938X2 Other specified disorders of left ear: Secondary | ICD-10-CM | POA: Insufficient documentation

## 2013-08-27 NOTE — Patient Instructions (Addendum)
  A small amount of buildup in the left ear. We have irrigated and removed some of the buildup.  Try children's Dimetapp for the next 5 days to see if this helps with your symptoms of fluid buildup within the left ear.  You can also try an over-the-counter antihistamine such as Claritin, Allegra or Zyrtec. Do not by the ones with decongestant in them which are designated by the D following the medication. These will raise your blood pressure.  Avoid putting hydrogen peroxide in your ear. This may be aggravating your symptoms.  If you are not improved within 5 days then we will refer you to ENT.

## 2013-08-27 NOTE — Progress Notes (Signed)
  Subjective:    Patient ID: Sara Mills, female    DOB: Apr 24, 1951, 62 y.o.   MRN: [id number]  HPI  Patient presents with feeling fluid in her left ear. Sounds appear slightly muffled on that side. She reports the sensation is "off and on". She has been putting peroxide in her ear thinking that this would help. She denies having any pain in the ear. She has no other symptoms.  Current Outpatient Prescriptions on File Prior to Visit  Medication Sig Dispense Refill  . estrogen-methylTESTOSTERone 0.625-1.25 MG per tablet Take 1 tablet by mouth daily.  30 tablet  5  . metFORMIN (GLUCOPHAGE) 1000 MG tablet Take 0.5 tablets (500 mg total) by mouth 2 (two) times daily with a meal.  60 tablet  11  . rosuvastatin (CRESTOR) 20 MG tablet Take 1 tablet (20 mg total) by mouth daily.  30 tablet  0  . zolpidem (AMBIEN) 10 MG tablet Take 1 tablet (10 mg total) by mouth at bedtime as needed.  30 tablet  3   No current facility-administered medications on file prior to visit.      Review of Systems  Constitutional: Negative for fever and chills.  HENT: Positive for hearing loss. Negative for ear pain, congestion, rhinorrhea, postnasal drip and ear discharge.        Ear fullness   BP 132/86  Pulse 60  Temp(Src) 97.8 F (36.6 C) (Oral)  Resp 12  Ht 5' 1.5" (1.562 m)  Wt 170 lb 8 oz (77.338 kg)  BMI 31.7 kg/m2  SpO2 97%     Objective:   Physical Exam  HENT:  Right Ear: External ear normal.  Nose: Nose normal.  Mouth/Throat: Oropharynx is clear and moist.  Left ear with cerumen buildup.      Assessment & Plan:

## 2013-08-27 NOTE — Assessment & Plan Note (Addendum)
Patient presents with sensation of fluid in the left ear ongoing for approximately 6-8 months. She has slight build up within the left ear. This was irrigated. Start Dimetapp for 5 days. May also take over-the-counter antihistamine without decongestant such as Claritin, Allegra or Zyrtec. If no improvement in 5 days consider referral to ENT. I have recommended she avoid using peroxide in her ear.

## 2013-09-12 ENCOUNTER — Other Ambulatory Visit: Payer: Self-pay | Admitting: Internal Medicine

## 2013-10-06 ENCOUNTER — Encounter: Payer: Self-pay | Admitting: *Deleted

## 2013-10-14 ENCOUNTER — Other Ambulatory Visit: Payer: Self-pay

## 2013-10-20 ENCOUNTER — Other Ambulatory Visit: Payer: Self-pay | Admitting: Internal Medicine

## 2013-10-20 MED ORDER — ROSUVASTATIN CALCIUM 20 MG PO TABS: ORAL_TABLET | ORAL | Status: DC

## 2013-10-20 NOTE — Telephone Encounter (Signed)
Last visit 2/14, refill? 

## 2013-11-19 ENCOUNTER — Other Ambulatory Visit: Payer: Self-pay | Admitting: Internal Medicine

## 2013-11-22 ENCOUNTER — Other Ambulatory Visit: Payer: Self-pay

## 2013-12-24 ENCOUNTER — Other Ambulatory Visit: Payer: Self-pay | Admitting: Internal Medicine

## 2013-12-24 NOTE — Telephone Encounter (Signed)
Pt notified of need for appointment, scheduled 01/10/14

## 2014-01-10 ENCOUNTER — Encounter: Payer: Self-pay | Admitting: Internal Medicine

## 2014-01-10 ENCOUNTER — Ambulatory Visit (INDEPENDENT_AMBULATORY_CARE_PROVIDER_SITE_OTHER): Payer: 59 | Admitting: Internal Medicine

## 2014-01-10 VITALS — BP 120/72 | HR 64 | Temp 97.9°F | Resp 18 | Ht 61.5 in | Wt 171.0 lb

## 2014-01-10 DIAGNOSIS — I10 Essential (primary) hypertension: Secondary | ICD-10-CM

## 2014-01-10 DIAGNOSIS — E119 Type 2 diabetes mellitus without complications: Secondary | ICD-10-CM

## 2014-01-10 DIAGNOSIS — E559 Vitamin D deficiency, unspecified: Secondary | ICD-10-CM

## 2014-01-10 DIAGNOSIS — E1169 Type 2 diabetes mellitus with other specified complication: Secondary | ICD-10-CM

## 2014-01-10 DIAGNOSIS — E041 Nontoxic single thyroid nodule: Secondary | ICD-10-CM

## 2014-01-10 DIAGNOSIS — R0600 Dyspnea, unspecified: Secondary | ICD-10-CM

## 2014-01-10 DIAGNOSIS — E785 Hyperlipidemia, unspecified: Secondary | ICD-10-CM

## 2014-01-10 DIAGNOSIS — Z0000 Encounter for general adult medical examination without abnormal findings: Secondary | ICD-10-CM

## 2014-01-10 DIAGNOSIS — A6000 Herpesviral infection of urogenital system, unspecified: Secondary | ICD-10-CM

## 2014-01-10 DIAGNOSIS — Z23 Encounter for immunization: Secondary | ICD-10-CM

## 2014-01-10 DIAGNOSIS — R5383 Other fatigue: Secondary | ICD-10-CM

## 2014-01-10 DIAGNOSIS — R0609 Other forms of dyspnea: Secondary | ICD-10-CM

## 2014-01-10 DIAGNOSIS — Z1239 Encounter for other screening for malignant neoplasm of breast: Secondary | ICD-10-CM

## 2014-01-10 DIAGNOSIS — E669 Obesity, unspecified: Secondary | ICD-10-CM

## 2014-01-10 DIAGNOSIS — R5381 Other malaise: Secondary | ICD-10-CM

## 2014-01-10 LAB — COMPREHENSIVE METABOLIC PANEL
ALT: 35 U/L (ref 0–35)
AST: 36 U/L (ref 0–37)
Albumin: 4.3 g/dL (ref 3.5–5.2)
Alkaline Phosphatase: 60 U/L (ref 39–117)
BILIRUBIN TOTAL: 0.6 mg/dL (ref 0.3–1.2)
BUN: 13 mg/dL (ref 6–23)
CO2: 27 meq/L (ref 19–32)
CREATININE: 0.9 mg/dL (ref 0.4–1.2)
Calcium: 9.2 mg/dL (ref 8.4–10.5)
Chloride: 103 mEq/L (ref 96–112)
GFR: 68.65 mL/min (ref >60.00)
Glucose, Bld: 168 mg/dL — ABNORMAL HIGH (ref 70–99)
Potassium: 3.9 mEq/L (ref 3.5–5.1)
SODIUM: 137 meq/L (ref 135–145)
TOTAL PROTEIN: 7.2 g/dL (ref 6.0–8.3)

## 2014-01-10 LAB — MICROALBUMIN / CREATININE URINE RATIO
CREATININE, U: 71 mg/dL
Microalb Creat Ratio: 0.4 mg/g (ref 0.0–30.0)
Microalb, Ur: 0.3 mg/dL (ref 0.0–1.9)

## 2014-01-10 LAB — HEMOGLOBIN A1C: HEMOGLOBIN A1C: 7.6 % — AB (ref 4.6–6.5)

## 2014-01-10 MED ORDER — VALACYCLOVIR HCL 500 MG PO TABS: 500.0000 mg | ORAL_TABLET | Freq: Three times a day (TID) | ORAL | Status: DC

## 2014-01-10 NOTE — Progress Notes (Signed)
Patient ID: Sara Mills, female   DOB: [birth year], 63 y.o.   MRN: [id number]   Subjective:     Sara Mills is a 63 y.o. female and is here for a comprehensive physical exam. The patient reports problems - as described below:  Here for annual exam..  She is s/p TAH/BSO for endometriosis,  Has no cervix but stil seeing gyn Dr.  Ubaldo Glassing for pelvic exams.  Had a vaginal PAP smear which was normal 2012.  No current  Issues. History of genital herpes,  Has an annual breakout treated with valtrex.  Not currently an issue   Cc: exertional dyspnea for years , even walking on level surfaces for short distances.  No history of tobacco abuse  But has multiple cardiac risk factors including diabetes, hyperlipidemia and hypertension . She leads a very sedentary life style.  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  Absolutely no exercise in that time and for probably longer    History   Social History  . Marital Status: Married    Spouse Name: N/A    Number of Children: N/A  . Years of Education: N/A   Occupational History  . Not on file.   Social History Main Topics  . Smoking status: Never Smoker   . Smokeless tobacco: Never Used  . Alcohol Use: No  . Drug Use: No  . Sexual Activity: Not on file   Other Topics Concern  . Not on file   Social History Narrative  . No narrative on file   Health Maintenance  Topic Date Due  . Zostavax  07/23/2013  . Influenza Vaccine  07/09/2014  . Pap Smear  08/28/2014  . Mammogram  10/30/2014  . Colonoscopy  03/20/2021  . Tetanus/tdap  01/11/2024    The following portions of the patient's history were reviewed and updated as appropriate:   Family History  Problem Relation Age of Onset  . Stroke Mother   . Cancer Father 41    carcinoid syndrome    Review of Systems  Patient denies headache, fevers, malaise, unintentional weight loss, skin rash, eye pain, sinus congestion and  sinus pain, sore throat, dysphagia,  hemoptysis , cough, dyspnea, wheezing, chest pain, palpitations, orthopnea, edema, abdominal pain, nausea, melena, diarrhea, constipation, flank pain, dysuria, hematuria, urinary  Frequency, nocturia, numbness, tingling, seizures,  Focal weakness, Loss of consciousness,  Tremor, insomnia, depression, anxiety, and suicidal ideation.     Objective:   BP 120/72  Pulse 64  Temp(Src) 97.9 F (36.6 C) (Oral)  Resp 18  Ht 5' 1.5" (1.562 m)  Wt 171 lb (77.565 kg)  BMI 31.79 kg/m2  SpO2 96%     General Appearance:    Alert, cooperative, no distress, appears stated age  Head:    Normocephalic, without obvious abnormality, atraumatic  Eyes:    PERRL, conjunctiva/corneas clear, EOM's intact, fundi    benign, both eyes  Ears:    Normal TM's and external ear canals, both ears  Nose:   Nares normal, septum midline, mucosa normal, no drainage    or sinus tenderness  Throat:   Lips, mucosa, and tongue normal; teeth and gums normal  Neck:   Supple, symmetrical, trachea midline, no adenopathy;    thyroid:  no enlargement/tenderness/nodules; no carotid   bruit or JVD  Back:     Symmetric, no curvature, ROM normal, no CVA tenderness  Lungs:     Clear to auscultation bilaterally, respirations unlabored  Chest Wall:    No tenderness or deformity   Heart:    Regular rate and rhythm, S1 and S2 normal, no murmur, rub   or gallop  Breast Exam:    No tenderness, masses, or nipple abnormality  Abdomen:     Soft, non-tender, bowel sounds active all four quadrants,    no masses, no organomegaly  Genitalia:    Pelvic: cervix surgically absent , external genitalia normal, no adnexal masses or tenderness, vagina normal without discharge  Extremities:   Extremities normal, atraumatic, no cyanosis or edema  Pulses:   2+ and symmetric all extremities  Skin:   Skin color, texture, turgor normal, no rashes or lesions  Lymph nodes:   Cervical, supraclavicular, and axillary nodes  normal  Neurologic:   CNII-XII intact, normal strength, sensation and reflexes    throughout      Assessment and Plan:   Encounter for preventive health examination Annual comprehensive exam was done including breast, pelvic without  PAP smear. All screenings have been addressed .   Hyperlipidemia Well controlled on current statin therapy.   Liver enzymes are normal , no changes today.  Lab Results  Component Value Date   CHOL 137 07/13/2013   HDL 43.20 07/13/2013   LDLCALC 72 07/13/2013   LDLDIRECT 162.4 08/06/2007   TRIG 110.0 07/13/2013   CHOLHDL 3 07/13/2013   Lab Results  Component Value Date   ALT 35 01/10/2014   AST 36 01/10/2014   ALKPHOS 60 01/10/2014   BILITOT 0.6 01/10/2014     Diabetes mellitus type 2 in obese  Diabetes has lost control on current regimen, . other labs are normal.Have asked her to limit the number of complex carbohydrates (starches, including white potatoes, Cereals, Cookies and cake) in your diet to one daily and start engaging in regular exercise.  Will increase metformin to 1000 mg bid if blood sugars over the next two weeks are not at goal.  Lab Results  Component Value Date   HGBA1C 7.6* 01/10/2014   Lab Results  Component Value Date   MICROALBUR 0.3 01/10/2014      Hypertension Well controlled on current regimen. Renal function stable, no changes today.  Lab Results  Component Value Date   CREATININE 0.9 01/10/2014   Lab Results  Component Value Date   NA 137 01/10/2014   K 3.9 01/10/2014   CL 103 01/10/2014   CO2 27 01/10/2014     Obesity (BMI 30.0-34.9) I have addressed  BMI and recommended a low glycemic index diet utilizing smaller more frequent meals to increase metabolism.  I have also recommended that patient start exercising with a goal of 30 minutes of aerobic exercise a minimum of 5 days per week.   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  Referral last year was done no further workup was  indicated.   Exertional dyspnea Her symptoms may be due to deconditioning, but given her multiple cardiac risk factors I am recommending cardiac risk stratification .  Referral to Dr Fletcher Anon .    Updated Medication List Outpatient Encounter Prescriptions as of 01/10/2014  Medication Sig  . CRESTOR 20 MG tablet TAKE 1 TABLET BY MOUTH EVERY DAY  . estrogen-methylTESTOSTERone 0.625-1.25 MG per tablet Take 1 tablet by mouth daily.  [name] lisinopril (PRINIVIL,ZESTRIL) 10 MG tablet TAKE 1 TABLET BY MOUTH DAILY  . metFORMIN (GLUCOPHAGE) 1000 MG tablet  Take 0.5 tablets (500 mg total) by mouth 2 (two) times daily with a meal.  . omeprazole-sodium bicarbonate (ZEGERID) 40-1100 MG per capsule Take 1 capsule by mouth daily before breakfast.   . zolpidem (AMBIEN) 10 MG tablet TAKE ONE TABLET BY MOUTH AT BEDTIME AS NEEDED  . valACYclovir (VALTREX) 500 MG tablet Take 1 tablet (500 mg total) by mouth 3 (three) times daily.

## 2014-01-10 NOTE — Patient Instructions (Signed)
You had your annual  wellness exam today  We will schedule your mammogram soon.   You received the  TDaP (tetanus diptheria acellular pertussis)   vaccine today. You are advised to have the Shingles vaccine, but check with your insuraqnce about when and where it will be covered.     We will contact you with the bloodwork results  Referral to cardiology is  In process

## 2014-01-10 NOTE — Progress Notes (Signed)
Pre-visit discussion using our clinic review tool. No additional management support is needed unless otherwise documented below in the visit note.  

## 2014-01-11 LAB — VITAMIN D 25 HYDROXY (VIT D DEFICIENCY, FRACTURES): Vit D, 25-Hydroxy: 45 ng/mL (ref 30–89)

## 2014-01-12 ENCOUNTER — Encounter: Payer: Self-pay | Admitting: Internal Medicine

## 2014-01-12 DIAGNOSIS — R0609 Other forms of dyspnea: Secondary | ICD-10-CM | POA: Insufficient documentation

## 2014-01-12 DIAGNOSIS — Z0000 Encounter for general adult medical examination without abnormal findings: Secondary | ICD-10-CM | POA: Insufficient documentation

## 2014-01-12 DIAGNOSIS — R0600 Dyspnea, unspecified: Secondary | ICD-10-CM | POA: Insufficient documentation

## 2014-01-12 NOTE — Assessment & Plan Note (Signed)
History of nontoxic MNG followed with serial imaging for years by ENT.  Referral last year was done no further workup was indicated.

## 2014-01-12 NOTE — Assessment & Plan Note (Signed)
I have addressed  BMI and recommended a low glycemic index diet utilizing smaller more frequent meals to increase metabolism.  I have also recommended that patient start exercising with a goal of 30 minutes of aerobic exercise a minimum of 5 days per week.  

## 2014-01-12 NOTE — Assessment & Plan Note (Signed)
Well controlled on current regimen. Renal function stable, no changes today.  Lab Results  Component Value Date   CREATININE 0.9 01/10/2014   Lab Results  Component Value Date   NA 137 01/10/2014   K 3.9 01/10/2014   CL 103 01/10/2014   CO2 27 01/10/2014

## 2014-01-12 NOTE — Assessment & Plan Note (Signed)
Well controlled on current statin therapy.   Liver enzymes are normal , no changes today.  Lab Results  Component Value Date   CHOL 137 07/13/2013   HDL 43.20 07/13/2013   LDLCALC 72 07/13/2013   LDLDIRECT 162.4 08/06/2007   TRIG 110.0 07/13/2013   CHOLHDL 3 07/13/2013   Lab Results  Component Value Date   ALT 35 01/10/2014   AST 36 01/10/2014   ALKPHOS 60 01/10/2014   BILITOT 0.6 01/10/2014

## 2014-01-12 NOTE — Assessment & Plan Note (Signed)
Diabetes has lost control on current regimen, . other labs are normal.Have asked her to limit the number of complex carbohydrates (starches, including white potatoes, Cereals, Cookies and cake) in your diet to one daily and start engaging in regular exercise.  Will increase metformin to 1000 mg bid if blood sugars over the next two weeks are not at goal.  Lab Results  Component Value Date   HGBA1C 7.6* 01/10/2014   Lab Results  Component Value Date   MICROALBUR 0.3 01/10/2014

## 2014-01-12 NOTE — Assessment & Plan Note (Signed)
Her symptoms may be due to deconditioning, but given her multiple cardiac risk factors I am recommending cardiac risk stratification .  Referral to Dr Fletcher Anon .

## 2014-01-12 NOTE — Assessment & Plan Note (Signed)
Annual comprehensive exam was done including breast, pelvic without PAP smear. All screenings have been addressed .  

## 2014-01-13 ENCOUNTER — Encounter: Payer: Self-pay | Admitting: Emergency Medicine

## 2014-01-14 ENCOUNTER — Telehealth: Payer: Self-pay

## 2014-01-14 NOTE — Telephone Encounter (Signed)
Mailed unread message to pt  

## 2014-01-14 NOTE — Telephone Encounter (Signed)
Relevant patient education assigned to patient using Emmi. ° °

## 2014-01-17 ENCOUNTER — Other Ambulatory Visit: Payer: Self-pay | Admitting: Internal Medicine

## 2014-01-20 ENCOUNTER — Encounter: Payer: Self-pay | Admitting: Cardiovascular Disease

## 2014-01-20 ENCOUNTER — Ambulatory Visit (INDEPENDENT_AMBULATORY_CARE_PROVIDER_SITE_OTHER): Payer: 59 | Admitting: Cardiovascular Disease

## 2014-01-20 VITALS — BP 157/88 | HR 60 | Ht 61.5 in | Wt 171.0 lb

## 2014-01-20 DIAGNOSIS — I10 Essential (primary) hypertension: Secondary | ICD-10-CM

## 2014-01-20 DIAGNOSIS — E785 Hyperlipidemia, unspecified: Secondary | ICD-10-CM

## 2014-01-20 DIAGNOSIS — R42 Dizziness and giddiness: Secondary | ICD-10-CM

## 2014-01-20 DIAGNOSIS — R0609 Other forms of dyspnea: Secondary | ICD-10-CM

## 2014-01-20 DIAGNOSIS — R0602 Shortness of breath: Secondary | ICD-10-CM

## 2014-01-20 DIAGNOSIS — R0989 Other specified symptoms and signs involving the circulatory and respiratory systems: Secondary | ICD-10-CM

## 2014-01-20 DIAGNOSIS — R0600 Dyspnea, unspecified: Secondary | ICD-10-CM

## 2014-01-20 NOTE — Patient Instructions (Addendum)
[place]  Your caregiver has ordered a Stress Test with nuclear imaging. The purpose of this test is to evaluate the blood supply to your heart muscle. This procedure is referred to as a "Non-Invasive Stress Test." This is because other than having an IV started in your vein, nothing is inserted or "invades" your body. Cardiac stress tests are done to find areas of poor blood flow to the heart by determining the extent of coronary artery disease (CAD). Some patients exercise on a treadmill, which naturally increases the blood flow to your heart, while others who are  unable to walk on a treadmill due to physical limitations have a pharmacologic/chemical stress agent called Lexiscan . This medicine will mimic walking on a treadmill by temporarily increasing your coronary blood flow.   Please note: these test may take anywhere between 2-4 hours to complete  PLEASE REPORT TO [facility] AT THE FIRST DESK WILL DIRECT YOU WHERE TO GO  Date of Procedure:___________2/16/15__________________________  Arrival Time for Procedure:__________0945 AM____________________  Instructions regarding medication:   Do not take diabetes medication the morning of your test.    PLEASE NOTIFY THE OFFICE AT LEAST 24 HOURS IN ADVANCE IF YOU ARE UNABLE TO KEEP YOUR APPOINTMENT.  [phone] AND  PLEASE NOTIFY NUCLEAR MEDICINE AT [facility] AT LEAST 24 HOURS IN ADVANCE IF YOU ARE UNABLE TO KEEP YOUR APPOINTMENT. [phone]  How to prepare for your Myoview test:  1. Do not eat or drink after midnight 2. No caffeine for 24 hours prior to test 3. No smoking 24 hours prior to test. 4. Your medication may be taken with water.  If your doctor stopped a medication because of this test, do not take that medication. 5. Ladies, please do not wear dresses.  Skirts or pants are appropriate. Please wear a short sleeve shirt. 6. No perfume, cologne or lotion. 7. Wear comfortable walking shoes. No  heels!     Follow up as needed.

## 2014-01-21 NOTE — Assessment & Plan Note (Signed)
I agree with treatment with a statin with a target LDL of less than 100 given her history of diabetes.

## 2014-01-21 NOTE — Progress Notes (Signed)
Primary care physician: Dr. Derrel Nip  HPI  This is a pleasant 63 year old female who was referred for evaluation of exertional dyspnea. She has chronic medical conditions that include type 2 diabetes, hypertension, and hyperlipidemia. She is not aware of any previous cardiac history. She has noted progressive exertional dyspnea over the last few years to the point of happening now with minimal activities even walking on flat level. No orthopnea, PND or lower extremity edema. She does not exercise. She denies any chest discomfort or tightness.  No history of tobacco abuse or any lung disease. 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    No Known Allergies   Current Outpatient Prescriptions on File Prior to Visit  Medication Sig Dispense Refill  . CRESTOR 20 MG tablet TAKE 1 TABLET BY MOUTH EVERY DAY  30 tablet  0  . EST ESTROGENS-METHYLTEST HS 0.625-1.25 MG per tablet TAKE 1 TABLET BY MOUTH ONCE DAILY  30 tablet  1  . lisinopril (PRINIVIL,ZESTRIL) 10 MG tablet TAKE 1 TABLET BY MOUTH DAILY  30 tablet  0  . metFORMIN (GLUCOPHAGE) 1000 MG tablet Take 0.5 tablets (500 mg total) by mouth 2 (two) times daily with a meal.  60 tablet  11  . omeprazole-sodium bicarbonate (ZEGERID) 40-1100 MG per capsule Take 1 capsule by mouth daily before breakfast.       . valACYclovir (VALTREX) 500 MG tablet Take 1 tablet (500 mg total) by mouth 3 (three) times daily.  21 tablet  0  . zolpidem (AMBIEN) 10 MG tablet TAKE ONE TABLET BY MOUTH AT BEDTIME AS NEEDED  30 tablet  0   No current facility-administered medications on file prior to visit.     Past Medical History  Diagnosis Date  . GERD (gastroesophageal reflux disease)   . Diabetes mellitus   . Hyperlipidemia   . Hypertension      Past Surgical History  Procedure Laterality Date  . Bilateral oophorectomy    . Cesarean section  1977 and 1978  . Carpal tunnel release Right   . Abdominal hysterectomy  1995      and BSO  . Appendectomy    . Bunionectomy       Family History  Problem Relation Age of Onset  . Stroke Mother   . Heart murmur Mother   . Mitral valve prolapse Mother   . Cancer Father 38    carcinoid syndrome  . Stomach cancer Father      History   Social History  . Marital Status: Married    Spouse Name: N/A    Number of Children: N/A  . Years of Education: N/A   Occupational History  . Not on file.   Social History Main Topics  . Smoking status: Never Smoker   . Smokeless tobacco: Never Used  . Alcohol Use: No  . Drug Use: No  . Sexual Activity: Not on file   Other Topics Concern  . Not on file   Social History Narrative  . No narrative on file     ROS A 10 point review of system was performed. It is negative other than that mentioned in the history of present illness.   PHYSICAL EXAM   BP 157/88  Pulse 60  Ht 5' 1.5" (1.562 m)  Wt 171 lb (77.565 kg)  BMI 31.79 kg/m2 Constitutional: She is oriented to person, place, and time. She appears well-developed and well-nourished. No distress.  HENT: No nasal discharge.  Head: Normocephalic and atraumatic.  Eyes: Pupils are equal and round. No discharge.  Neck: Normal range of motion. Neck supple. No JVD present. No thyromegaly present.  Cardiovascular: Normal rate, regular rhythm, normal heart sounds. Exam reveals no gallop and no friction rub. No murmur heard.  Pulmonary/Chest: Effort normal and breath sounds normal. No stridor. No respiratory distress. She has no wheezes. She has no rales. She exhibits no tenderness.  Abdominal: Soft. Bowel sounds are normal. She exhibits no distension. There is no tenderness. There is no rebound and no guarding.  Musculoskeletal: Normal range of motion. She exhibits no edema and no tenderness.  Neurological: She is alert and oriented to person, place, and time. Coordination normal.  Skin: Skin is warm and dry. No rash noted. She is not diaphoretic. No erythema. No  pallor.  Psychiatric: She has a normal mood and affect. Her behavior is normal. Judgment and thought content normal.     EKG: Normal sinus rhythm with first-degree AV block.   ASSESSMENT AND PLAN

## 2014-01-21 NOTE — Assessment & Plan Note (Signed)
Blood pressure is elevated today but usually is more controlled.

## 2014-01-21 NOTE — Assessment & Plan Note (Signed)
The patient has significant exertional dyspnea without chest discomfort. Given her multiple risk factors for coronary artery disease, I recommend proceeding with a treadmill nuclear stress test. We will also evaluate her LV systolic function with nuclear imaging. The other possibility for exertional dyspnea and is extreme physical deconditioning. I discussed with the patient the importance of lifestyle changes in order to decrease the chance of future coronary artery disease and cardiovascular events. We discussed the importance of controlling risk factors, healthy diet as well as regular exercise. I also explained to him that a normal stress test does not rule out atherosclerosis.

## 2014-01-24 ENCOUNTER — Ambulatory Visit: Payer: Self-pay | Admitting: Cardiovascular Disease

## 2014-01-24 DIAGNOSIS — R0602 Shortness of breath: Secondary | ICD-10-CM

## 2014-01-26 ENCOUNTER — Other Ambulatory Visit: Payer: Self-pay

## 2014-01-26 DIAGNOSIS — R0602 Shortness of breath: Secondary | ICD-10-CM

## 2014-01-28 ENCOUNTER — Other Ambulatory Visit: Payer: Self-pay | Admitting: Internal Medicine

## 2014-02-16 ENCOUNTER — Other Ambulatory Visit: Payer: Self-pay | Admitting: Internal Medicine

## 2014-02-17 ENCOUNTER — Other Ambulatory Visit: Payer: Self-pay | Admitting: Internal Medicine

## 2014-02-17 NOTE — Telephone Encounter (Signed)
Last visit 01/10/14, ok refill?

## 2014-02-21 NOTE — Telephone Encounter (Signed)
Refill faxed

## 2014-02-21 NOTE — Telephone Encounter (Signed)
Ok to refill,  printed rx  

## 2014-05-04 ENCOUNTER — Other Ambulatory Visit: Payer: Self-pay | Admitting: Internal Medicine

## 2014-05-04 NOTE — Telephone Encounter (Signed)
Last visit 01/10/14, ok refill?

## 2014-06-05 ENCOUNTER — Telehealth: Payer: Self-pay | Admitting: Internal Medicine

## 2014-06-05 DIAGNOSIS — E119 Type 2 diabetes mellitus without complications: Secondary | ICD-10-CM

## 2014-06-05 NOTE — Telephone Encounter (Signed)
Patient is overdue for 3 month DM follow up,  Please arrange fasting labs appt preferably 2 or more days prior to OV

## 2014-06-06 NOTE — Telephone Encounter (Signed)
Appointment and fasting labs scheduled labs 07/01/14 appointment 07/05/14.

## 2014-07-01 ENCOUNTER — Other Ambulatory Visit: Payer: 59

## 2014-07-05 ENCOUNTER — Ambulatory Visit: Payer: 59 | Admitting: Internal Medicine

## 2014-07-06 ENCOUNTER — Encounter: Payer: Self-pay | Admitting: *Deleted

## 2014-07-08 NOTE — Telephone Encounter (Signed)
Mailed unread message to pt  

## 2014-08-01 ENCOUNTER — Other Ambulatory Visit: Payer: Self-pay | Admitting: Internal Medicine

## 2014-08-08 ENCOUNTER — Other Ambulatory Visit (INDEPENDENT_AMBULATORY_CARE_PROVIDER_SITE_OTHER): Payer: 59

## 2014-08-08 DIAGNOSIS — E119 Type 2 diabetes mellitus without complications: Secondary | ICD-10-CM

## 2014-08-08 DIAGNOSIS — R7989 Other specified abnormal findings of blood chemistry: Secondary | ICD-10-CM

## 2014-08-08 DIAGNOSIS — R5383 Other fatigue: Secondary | ICD-10-CM

## 2014-08-08 DIAGNOSIS — R5381 Other malaise: Secondary | ICD-10-CM

## 2014-08-08 LAB — COMPREHENSIVE METABOLIC PANEL
ALBUMIN: 4.2 g/dL (ref 3.5–5.2)
ALK PHOS: 77 U/L (ref 39–117)
ALT: 35 U/L (ref 0–35)
AST: 34 U/L (ref 0–37)
BUN: 17 mg/dL (ref 6–23)
CALCIUM: 9.7 mg/dL (ref 8.4–10.5)
CHLORIDE: 102 meq/L (ref 96–112)
CO2: 28 mEq/L (ref 19–32)
Creatinine, Ser: 0.8 mg/dL (ref 0.4–1.2)
GFR: 79.79 mL/min (ref >60.00)
GLUCOSE: 143 mg/dL — AB (ref 70–99)
Potassium: 4.4 mEq/L (ref 3.5–5.1)
Sodium: 138 mEq/L (ref 135–145)
Total Bilirubin: 0.7 mg/dL (ref 0.2–1.2)
Total Protein: 7.3 g/dL (ref 6.0–8.3)

## 2014-08-08 LAB — MICROALBUMIN / CREATININE URINE RATIO
CREATININE, U: 155.3 mg/dL
MICROALB UR: 2.3 mg/dL — AB (ref 0.0–1.9)
Microalb Creat Ratio: 1.5 mg/g (ref 0.0–30.0)

## 2014-08-08 LAB — LIPID PANEL
CHOLESTEROL: 183 mg/dL (ref 0–200)
HDL: 47.5 mg/dL (ref >39.00)
NonHDL: 135.5
Total CHOL/HDL Ratio: 4
Triglycerides: 242 mg/dL — ABNORMAL HIGH (ref 0.0–149.0)
VLDL: 48.4 mg/dL — ABNORMAL HIGH (ref 0.0–40.0)

## 2014-08-08 LAB — TSH: TSH: 1.32 u[IU]/mL (ref 0.35–4.50)

## 2014-08-08 LAB — LDL CHOLESTEROL, DIRECT: Direct LDL: 118.5 mg/dL

## 2014-08-08 LAB — HEMOGLOBIN A1C: HEMOGLOBIN A1C: 7.6 % — AB (ref 4.6–6.5)

## 2014-08-10 ENCOUNTER — Encounter: Payer: Self-pay | Admitting: Internal Medicine

## 2014-08-12 ENCOUNTER — Encounter: Payer: Self-pay | Admitting: Internal Medicine

## 2014-08-12 ENCOUNTER — Ambulatory Visit (INDEPENDENT_AMBULATORY_CARE_PROVIDER_SITE_OTHER): Payer: 59 | Admitting: Internal Medicine

## 2014-08-12 VITALS — BP 134/76 | HR 68 | Temp 98.0°F | Resp 16 | Ht 61.5 in | Wt 165.2 lb

## 2014-08-12 DIAGNOSIS — E785 Hyperlipidemia, unspecified: Secondary | ICD-10-CM

## 2014-08-12 DIAGNOSIS — Z23 Encounter for immunization: Secondary | ICD-10-CM

## 2014-08-12 DIAGNOSIS — E119 Type 2 diabetes mellitus without complications: Secondary | ICD-10-CM

## 2014-08-12 DIAGNOSIS — E669 Obesity, unspecified: Secondary | ICD-10-CM

## 2014-08-12 DIAGNOSIS — E1169 Type 2 diabetes mellitus with other specified complication: Secondary | ICD-10-CM

## 2014-08-12 DIAGNOSIS — I10 Essential (primary) hypertension: Secondary | ICD-10-CM

## 2014-08-12 MED ORDER — CONJ ESTROG-MEDROXYPROGEST ACE 0.625-5 MG PO TABS: 1.0000 | ORAL_TABLET | Freq: Every day | ORAL | Status: DC

## 2014-08-12 MED ORDER — METFORMIN HCL ER (OSM) 1000 MG PO TB24: 1000.0000 mg | ORAL_TABLET | Freq: Every day | ORAL | Status: DC

## 2014-08-12 MED ORDER — TRIAMCINOLONE ACETONIDE 0.1 % EX CREA: 1.0000 "application " | TOPICAL_CREAM | Freq: Two times a day (BID) | CUTANEOUS | Status: DC

## 2014-08-12 NOTE — Patient Instructions (Addendum)
A normal fasting glucose is 80 to 120  a normal post prandial is 100 to 150  Our goal is to get your a1c < 7.0  I have changed your metformin to ER to last the whole day   If  Your blood sugars  Are  not close to goal in a month,  Call for additional medication   We are resuming hormone therapy with Prempro.   Diabetes Mellitus and Food It is important for you to manage your blood sugar (glucose) level. Your blood glucose level can be greatly affected by what you eat. Eating healthier foods in the appropriate amounts throughout the day at about the same time each day will help you control your blood glucose level. It can also help slow or prevent worsening of your diabetes mellitus. Healthy eating may even help you improve the level of your blood pressure and reach or maintain a healthy weight.  HOW CAN FOOD AFFECT ME? Carbohydrates Carbohydrates affect your blood glucose level more than any other type of food. Your dietitian will help you determine how many carbohydrates to eat at each meal and teach you how to count carbohydrates. Counting carbohydrates is important to keep your blood glucose at a healthy level, especially if you are using insulin or taking certain medicines for diabetes mellitus. Alcohol Alcohol can cause sudden decreases in blood glucose (hypoglycemia), especially if you use insulin or take certain medicines for diabetes mellitus. Hypoglycemia can be a life-threatening condition. Symptoms of hypoglycemia (sleepiness, dizziness, and disorientation) are similar to symptoms of having too much alcohol.  If your health care provider has given you approval to drink alcohol, do so in moderation and use the following guidelines:  Women should not have more than one drink per day, and men should not have more than two drinks per day. One drink is equal to:  12 oz of beer.  5 oz of wine.  1 oz of hard liquor.  Do not drink on an empty stomach.  Keep yourself hydrated. Have  water, diet soda, or unsweetened iced tea.  Regular soda, juice, and other mixers might contain a lot of carbohydrates and should be counted. WHAT FOODS ARE NOT RECOMMENDED? As you make food choices, it is important to remember that all foods are not the same. Some foods have fewer nutrients per serving than other foods, even though they might have the same number of calories or carbohydrates. It is difficult to get your body what it needs when you eat foods with fewer nutrients. Examples of foods that you should avoid that are high in calories and carbohydrates but low in nutrients include:  Trans fats (most processed foods list trans fats on the Nutrition Facts label).  Regular soda.  Juice.  Candy.  Sweets, such as cake, pie, doughnuts, and cookies.  Fried foods. WHAT FOODS CAN I EAT? Have nutrient-rich foods, which will nourish your body and keep you healthy. The food you should eat also will depend on several factors, including:  The calories you need.  The medicines you take.  Your weight.  Your blood glucose level.  Your blood pressure level.  Your cholesterol level. You also should eat a variety of foods, including:  Protein, such as meat, poultry, fish, tofu, nuts, and seeds (lean animal proteins are best).  Fruits.  Vegetables.  Dairy products, such as milk, cheese, and yogurt (low fat is best).  Breads, grains, pasta, cereal, rice, and beans.  Fats such as olive oil, trans fat-free margarine,  canola oil, avocado, and olives. DOES EVERYONE WITH DIABETES MELLITUS HAVE THE SAME MEAL PLAN? Because every person with diabetes mellitus is different, there is not one meal plan that works for everyone. It is very important that you meet with a dietitian who will help you create a meal plan that is just right for you. Document Released: 08/22/2005 Document Revised: 11/30/2013 Document Reviewed: 10/22/2013 [facility] Patient Information 2015 [place], [place]. This  information is not intended to replace advice given to you by your health care provider. Make sure you discuss any questions you have with your health care provider.   Diabetes and Standards of Medical Care Diabetes is complicated. You may find that your diabetes team includes a dietitian, nurse, diabetes educator, eye doctor, and more. To help everyone know what is going on and to help you get the care you deserve, the following schedule of care was developed to help keep you on track. Below are the tests, exams, vaccines, medicines, education, and plans you will need. HbA1c test This test shows how well you have controlled your glucose over the past 2-3 months. It is used to see if your diabetes management plan needs to be adjusted.   It is performed at least 2 times a year if you are meeting treatment goals.  It is performed 4 times a year if therapy has changed or if you are not meeting treatment goals. Blood pressure test  This test is performed at every routine medical visit. The goal is less than 140/90 mm Hg for most people, but 130/80 mm Hg in some cases. Ask your health care provider about your goal. Dental exam  Follow up with the dentist regularly. Eye exam  If you are diagnosed with type 1 diabetes as a child, get an exam upon reaching the age of 34 years or older and have had diabetes for 3-5 years. Yearly eye exams are recommended after that initial eye exam.  If you are diagnosed with type 1 diabetes as an adult, get an exam within 5 years of diagnosis and then yearly.  If you are diagnosed with type 2 diabetes, get an exam as soon as possible after the diagnosis and then yearly. Foot care exam  Visual foot exams are performed at every routine medical visit. The exams check for cuts, injuries, or other problems with the feet.  A comprehensive foot exam should be done yearly. This includes visual inspection as well as assessing foot pulses and testing for loss of  sensation.  Check your feet nightly for cuts, injuries, or other problems with your feet. Tell your health care provider if anything is not healing. Kidney function test (urine microalbumin)  This test is performed once a year.  Type 1 diabetes: The first test is performed 5 years after diagnosis.  Type 2 diabetes: The first test is performed at the time of diagnosis.  A serum creatinine and estimated glomerular filtration rate (eGFR) test is done once a year to assess the level of chronic kidney disease (CKD), if present. Lipid profile (cholesterol, HDL, LDL, triglycerides)  Performed every 5 years for most people.  The goal for LDL is less than 100 mg/dL. If you are at high risk, the goal is less than 70 mg/dL.  The goal for HDL is 40 mg/dL-50 mg/dL for men and 50 mg/dL-60 mg/dL for women. An HDL cholesterol of 60 mg/dL or higher gives some protection against heart disease.  The goal for triglycerides is less than 150 mg/dL. Influenza vaccine,  pneumococcal vaccine, and hepatitis B vaccine  The influenza vaccine is recommended yearly.  It is recommended that people with diabetes who are over 61 years old get the pneumonia vaccine. In some cases, two separate shots may be given. Ask your health care provider if your pneumonia vaccination is up to date.  The hepatitis B vaccine is also recommended for adults with diabetes. Diabetes self-management education  Education is recommended at diagnosis and ongoing as needed. Treatment plan  Your treatment plan is reviewed at every medical visit. Document Released: 09/22/2009 Document Revised: 04/11/2014 Document Reviewed: 04/27/2013 [facility] Patient Information 2015 [place], [place]. This information is not intended to replace advice given to you by your health care provider. Make sure you discuss any questions you have with your health care provider.

## 2014-08-12 NOTE — Progress Notes (Signed)
Pre-visit discussion using our clinic review tool. No additional management support is needed unless otherwise documented below in the visit note.  

## 2014-08-12 NOTE — Progress Notes (Signed)
Patient ID: Sara Mills, female   DOB: Dec 14, 1950, 63 y.o.   MRN: [id number]   Patient Active Problem List   Diagnosis Date Noted  . Encounter for preventive health examination 01/12/2014  . Exertional dyspnea 01/12/2014  . Diabetes mellitus type 2 in obese 08/26/2012  . Obesity (BMI 30.0-34.9) 03/22/2012  . Premature menopause on hormone replacement therapy 03/20/2012  . Hyperlipidemia   . Hypertension   . THYROID NODULE 08/06/2007  . COLONIC POLYPS 07/27/2007  . HYPERLIPIDEMIA 07/27/2007  . ANEMIA-NOS 07/27/2007  . GERD 12/09/2004    Subjective:  CC:   Chief Complaint  Patient presents with  . Follow-up  . Diabetes    HPI:   Sara Mills is a 63 y.o. female who presents for 6 month follow up on hypertension, Type 2 DM and hyperlipidemia and obesity.  She has lost 6 lbs since last visit in Feb. Recent labs reviewed.    She underwent right wrist fusion July 14  By Dr Amedeo Plenty for management of chronc tendonitis, the sequelae of tearing all ligaments 15 yrs ago from carrying something heavy.  She will be wearing a brace until next week, then a soft brace .  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   She has not been exercising or following a low glycemic index diet since  Her surgery.  She is taking her medications as directed.      Past Medical History  Diagnosis Date  . GERD (gastroesophageal reflux disease)   . Diabetes mellitus   . Hyperlipidemia   . Hypertension     Past Surgical History  Procedure Laterality Date  . Bilateral oophorectomy    . Cesarean section  1977 and 1978  . Carpal tunnel release Right   . Abdominal hysterectomy  1995     and BSO  . Appendectomy    . Bunionectomy         The following portions of the patient's history were reviewed and updated as appropriate: Allergies, current medications, and problem list.    Review of Systems:   Patient denies headache, fevers, malaise, unintentional  weight loss, skin rash, eye pain, sinus congestion and sinus pain, sore throat, dysphagia,  hemoptysis , cough, dyspnea, wheezing, chest pain, palpitations, orthopnea, edema, abdominal pain, nausea, melena, diarrhea, constipation, flank pain, dysuria, hematuria, urinary  Frequency, nocturia, numbness, tingling, seizures,  Focal weakness, Loss of consciousness,  Tremor, insomnia, depression, anxiety, and suicidal ideation.     History   Social History  . Marital Status: Married    Spouse Name: N/A    Number of Children: N/A  . Years of Education: N/A   Occupational History  . Not on file.   Social History Main Topics  . Smoking status: Never Smoker   . Smokeless tobacco: Never Used  . Alcohol Use: No  . Drug Use: No  . Sexual Activity: Not on file   Other Topics Concern  . Not on file   Social History Narrative  . No narrative on file    Objective:  Filed Vitals:   08/12/14 1045  BP: 134/76  Pulse: 68  Temp: 98 F (36.7 C)  Resp: 16     General appearance: alert, cooperative and appears stated age Ears: normal TM's and external ear canals both ears Throat: lips, mucosa, and tongue normal; teeth and gums normal Neck: no adenopathy, no carotid bruit, supple, symmetrical, trachea midline and thyroid not enlarged, symmetric,  no tenderness/mass/nodules Back: symmetric, no curvature. ROM normal. No CVA tenderness. Lungs: clear to auscultation bilaterally Heart: regular rate and rhythm, S1, S2 normal, no murmur, click, rub or gallop Abdomen: soft, non-tender; bowel sounds normal; no masses,  no organomegaly Pulses: 2+ and symmetric Skin: Skin color, texture, turgor normal. No rashes or lesions Lymph nodes: Cervical, supraclavicular, and axillary nodes normal.  Assessment and Plan:  Hypertension Well controlled on current regimen. Renal function stable, no changes today. Lab Results  Component Value Date   CREATININE 0.8 08/08/2014   Lab Results  Component Value  Date   NA 138 08/08/2014   K 4.4 08/08/2014   CL 102 08/08/2014   CO2 28 08/08/2014    Diabetes mellitus type 2 in obese  Diabetes has lost control on current regimen, because she is not remembering to take her metformin twice daily. [name] other labs are normal.Have asked her to limit the number of complex carbohydrates (starches, including white potatoes, Cereals, Cookies and cake) in your diet to one daily and start engaging in regular exercise.  Will change metformin to ER and add glipizide in one month if blood sugars are not at goal.  Lab Results  Component Value Date   HGBA1C 7.6* 08/08/2014   Lab Results  Component Value Date   MICROALBUR 2.3* 08/08/2014        HYPERLIPIDEMIA LDL is at goal on current medications. She has no side effects and liver enzymes are normal. No changes today  Lab Results  Component Value Date   CHOL 183 08/08/2014   HDL 47.50 08/08/2014   LDLCALC 72 07/13/2013   LDLDIRECT 118.5 08/08/2014   TRIG 242.0* 08/08/2014   CHOLHDL 4 08/08/2014    Lab Results  Component Value Date   ALT 35 08/08/2014   AST 34 08/08/2014   ALKPHOS 77 08/08/2014   BILITOT 0.7 08/08/2014      Obesity (BMI 30.0-34.9) I have addressed  BMI and recommended wt loss of 10% of body weigh over the next 6 months using a low glycemic index diet and regular exercise a minimum of 5 days per week.     Updated Medication List Outpatient Encounter Prescriptions as of 08/12/2014  Medication Sig  . CRESTOR 20 MG tablet TAKE 1 TABLET BY MOUTH EVERY DAY  . lisinopril (PRINIVIL,ZESTRIL) 10 MG tablet TAKE 1 TABLET BY MOUTH EVERY DAY  . omeprazole-sodium bicarbonate (ZEGERID) 40-1100 MG per capsule Take 1 capsule by mouth daily before breakfast.   . valACYclovir (VALTREX) 500 MG tablet Take 1 tablet (500 mg total) by mouth 3 (three) times daily.  [name] zolpidem (AMBIEN) 10 MG tablet TAKE 1 TABLET BY MOUTH AT BEDTIME AS NEEDED  . [DISCONTINUED] metFORMIN (GLUCOPHAGE) 1000 MG tablet TAKE ONE-HALF TABLET  BY MOUTH TWICE DAILY WITH A MEAL  . estrogen, conjugated,-medroxyprogesterone (PREMPRO) 0.625-5 MG per tablet Take 1 tablet by mouth daily.  . metformin (FORTAMET) 1000 MG (OSM) 24 hr tablet Take 1 tablet (1,000 mg total) by mouth daily with breakfast.  . triamcinolone cream (KENALOG) 0.1 % Apply 1 application topically 2 (two) times daily.  . [DISCONTINUED] EST ESTROGENS-METHYLTEST HS 0.625-1.25 MG per tablet TAKE 1 TABLET BY MOUTH DAILY     Orders Placed This Encounter  Procedures  . Pneumococcal conjugate vaccine 13-valent    Return in about 3 months (around 11/11/2014) for follow up diabetes.

## 2014-08-14 ENCOUNTER — Other Ambulatory Visit: Payer: Self-pay | Admitting: Internal Medicine

## 2014-08-14 ENCOUNTER — Encounter: Payer: Self-pay | Admitting: Internal Medicine

## 2014-08-14 LAB — HM DIABETES EYE EXAM

## 2014-08-14 NOTE — Assessment & Plan Note (Signed)
Well controlled on current regimen. Renal function stable, no changes today. Lab Results  Component Value Date   CREATININE 0.8 08/08/2014   Lab Results  Component Value Date   NA 138 08/08/2014   K 4.4 08/08/2014   CL 102 08/08/2014   CO2 28 08/08/2014

## 2014-08-14 NOTE — Assessment & Plan Note (Signed)
I have addressed  BMI and recommended wt loss of 10% of body weigh over the next 6 months using a low glycemic index diet and regular exercise a minimum of 5 days per week.   

## 2014-08-14 NOTE — Assessment & Plan Note (Signed)
LDL is at goal on current medications. She has no side effects and liver enzymes are normal. No changes today  Lab Results  Component Value Date   CHOL 183 08/08/2014   HDL 47.50 08/08/2014   LDLCALC 72 07/13/2013   LDLDIRECT 118.5 08/08/2014   TRIG 242.0* 08/08/2014   CHOLHDL 4 08/08/2014    Lab Results  Component Value Date   ALT 35 08/08/2014   AST 34 08/08/2014   ALKPHOS 77 08/08/2014   BILITOT 0.7 08/08/2014

## 2014-08-14 NOTE — Assessment & Plan Note (Addendum)
Diabetes has lost control on current regimen, because she is not remembering to take her metformin twice daily. [name] other labs are normal.Have asked her to limit the number of complex carbohydrates (starches, including white potatoes, Cereals, Cookies and cake) in your diet to one daily and start engaging in regular exercise.  Will change metformin to ER and add glipizide in one month if blood sugars are not at goal.  Lab Results  Component Value Date   HGBA1C 7.6* 08/08/2014   Lab Results  Component Value Date   MICROALBUR 2.3* 08/08/2014

## 2014-08-31 ENCOUNTER — Other Ambulatory Visit: Payer: Self-pay | Admitting: Internal Medicine

## 2014-09-12 ENCOUNTER — Other Ambulatory Visit: Payer: Self-pay | Admitting: Internal Medicine

## 2014-10-03 ENCOUNTER — Other Ambulatory Visit: Payer: Self-pay | Admitting: Internal Medicine

## 2014-10-04 ENCOUNTER — Other Ambulatory Visit: Payer: Self-pay | Admitting: Internal Medicine

## 2014-10-04 NOTE — Telephone Encounter (Signed)
Refill one 30 days only.  Has not been seen in over 6 months so needs office visit prior to any more refills 

## 2014-10-04 NOTE — Telephone Encounter (Signed)
Last refill 7.9.15, last OV 9.4.15.  Please advise refill

## 2014-10-05 NOTE — Telephone Encounter (Signed)
Refill faxed

## 2014-10-26 ENCOUNTER — Other Ambulatory Visit: Payer: Self-pay | Admitting: Internal Medicine

## 2014-10-26 NOTE — Telephone Encounter (Signed)
Last OV 9.4.15, last refill 7.9.15.  Please advise refill

## 2014-10-28 NOTE — Telephone Encounter (Signed)
Faxed to pharmacy

## 2014-10-28 NOTE — Telephone Encounter (Signed)
Ok to refill,  printed rx  

## 2014-10-31 ENCOUNTER — Other Ambulatory Visit: Payer: Self-pay | Admitting: Internal Medicine

## 2014-11-27 ENCOUNTER — Other Ambulatory Visit: Payer: Self-pay | Admitting: Internal Medicine

## 2015-02-07 ENCOUNTER — Other Ambulatory Visit: Payer: Self-pay | Admitting: Internal Medicine

## 2015-03-22 ENCOUNTER — Other Ambulatory Visit: Payer: Self-pay | Admitting: Internal Medicine

## 2015-03-22 DIAGNOSIS — E669 Obesity, unspecified: Secondary | ICD-10-CM

## 2015-03-22 DIAGNOSIS — E1169 Type 2 diabetes mellitus with other specified complication: Secondary | ICD-10-CM

## 2015-03-22 DIAGNOSIS — I10 Essential (primary) hypertension: Secondary | ICD-10-CM

## 2015-03-22 NOTE — Telephone Encounter (Signed)
Last OV 9.4.15.  Please advise refill

## 2015-03-23 NOTE — Telephone Encounter (Signed)
Left message for patient to return call to office. 

## 2015-03-23 NOTE — Telephone Encounter (Signed)
Refills sent,  BUT:   Patient is overdue for diabetes follow up,  Last seen in August,  Please set up fasting labs prior to appt.  ASAP LABS ORDERED

## 2015-03-30 ENCOUNTER — Other Ambulatory Visit: Payer: Self-pay | Admitting: Dermatology

## 2015-04-20 ENCOUNTER — Other Ambulatory Visit: Payer: Self-pay | Admitting: Internal Medicine

## 2015-04-25 NOTE — Telephone Encounter (Signed)
Called, left message for pt. Advised to call back to schedule appt ASAP

## 2015-05-05 ENCOUNTER — Encounter: Payer: Self-pay | Admitting: *Deleted

## 2015-05-23 ENCOUNTER — Other Ambulatory Visit: Payer: Self-pay | Admitting: Internal Medicine

## 2015-05-23 NOTE — Telephone Encounter (Signed)
Spoke to pt, scheduled labs and appt 06/13/15. Rx sent to pharmacy by escript

## 2015-05-23 NOTE — Telephone Encounter (Signed)
Left message for pt to return my call.

## 2015-05-23 NOTE — Telephone Encounter (Deleted)
6/14  LM

## 2015-06-13 ENCOUNTER — Ambulatory Visit: Payer: Self-pay | Admitting: Internal Medicine

## 2015-06-14 ENCOUNTER — Ambulatory Visit (INDEPENDENT_AMBULATORY_CARE_PROVIDER_SITE_OTHER): Payer: 59 | Admitting: Internal Medicine

## 2015-06-14 ENCOUNTER — Telehealth: Payer: Self-pay | Admitting: *Deleted

## 2015-06-14 ENCOUNTER — Encounter: Payer: Self-pay | Admitting: Internal Medicine

## 2015-06-14 VITALS — BP 132/84 | HR 61 | Temp 98.0°F | Ht 61.5 in | Wt 159.5 lb

## 2015-06-14 DIAGNOSIS — I10 Essential (primary) hypertension: Secondary | ICD-10-CM | POA: Diagnosis not present

## 2015-06-14 DIAGNOSIS — Z1239 Encounter for other screening for malignant neoplasm of breast: Secondary | ICD-10-CM

## 2015-06-14 DIAGNOSIS — E669 Obesity, unspecified: Secondary | ICD-10-CM | POA: Diagnosis not present

## 2015-06-14 DIAGNOSIS — E1165 Type 2 diabetes mellitus with hyperglycemia: Secondary | ICD-10-CM

## 2015-06-14 DIAGNOSIS — E119 Type 2 diabetes mellitus without complications: Secondary | ICD-10-CM | POA: Diagnosis not present

## 2015-06-14 DIAGNOSIS — K219 Gastro-esophageal reflux disease without esophagitis: Secondary | ICD-10-CM

## 2015-06-14 DIAGNOSIS — E1169 Type 2 diabetes mellitus with other specified complication: Secondary | ICD-10-CM

## 2015-06-14 DIAGNOSIS — E041 Nontoxic single thyroid nodule: Secondary | ICD-10-CM

## 2015-06-14 LAB — COMPREHENSIVE METABOLIC PANEL
ALK PHOS: 71 U/L (ref 39–117)
ALT: 22 U/L (ref 0–35)
AST: 25 U/L (ref 0–37)
Albumin: 4.1 g/dL (ref 3.5–5.2)
BUN: 13 mg/dL (ref 6–23)
CO2: 27 mEq/L (ref 19–32)
CREATININE: 0.88 mg/dL (ref 0.40–1.20)
Calcium: 9.6 mg/dL (ref 8.4–10.5)
Chloride: 103 mEq/L (ref 96–112)
GFR: 69.23 mL/min (ref >60.00)
Glucose, Bld: 108 mg/dL — ABNORMAL HIGH (ref 70–99)
Potassium: 4.3 mEq/L (ref 3.5–5.1)
Sodium: 138 mEq/L (ref 135–145)
Total Bilirubin: 0.4 mg/dL (ref 0.2–1.2)
Total Protein: 7.3 g/dL (ref 6.0–8.3)

## 2015-06-14 LAB — LIPID PANEL
Cholesterol: 160 mg/dL (ref 0–200)
HDL: 46.1 mg/dL (ref >39.00)
LDL Cholesterol: 87 mg/dL (ref 0–99)
NONHDL: 113.9
TRIGLYCERIDES: 133 mg/dL (ref 0.0–149.0)
Total CHOL/HDL Ratio: 3
VLDL: 26.6 mg/dL (ref 0.0–40.0)

## 2015-06-14 LAB — LDL CHOLESTEROL, DIRECT: Direct LDL: 94 mg/dL

## 2015-06-14 LAB — HM DIABETES FOOT EXAM: HM Diabetic Foot Exam: NORMAL

## 2015-06-14 LAB — MICROALBUMIN / CREATININE URINE RATIO
Creatinine,U: 108.3 mg/dL
MICROALB/CREAT RATIO: 0.6 mg/g (ref 0.0–30.0)
Microalb, Ur: <0.7 mg/dL (ref 0.0–1.9)

## 2015-06-14 LAB — HEMOGLOBIN A1C: Hgb A1c MFr Bld: 7.3 % — ABNORMAL HIGH (ref 4.6–6.5)

## 2015-06-14 MED ORDER — LIDOCAINE 0.5 % EX GEL: CUTANEOUS | Status: DC

## 2015-06-14 MED ORDER — VALACYCLOVIR HCL 500 MG PO TABS: 500.0000 mg | ORAL_TABLET | Freq: Three times a day (TID) | ORAL | Status: DC

## 2015-06-14 MED ORDER — LIDOCAINE 5 % EX OINT: 1.0000 "application " | TOPICAL_OINTMENT | CUTANEOUS | Status: DC | PRN

## 2015-06-14 NOTE — Progress Notes (Signed)
Subjective:  Patient ID: Sara Mills, female    DOB: [birth year]  Age: 64 y.o. MRN: [id number]  CC: The primary encounter diagnosis was Inadequately controlled diabetes mellitus. Diagnoses of Screening for breast cancer, Diabetes mellitus type 2 in obese, Essential hypertension, Obesity (BMI 30.0-34.9), THYROID NODULE, and Gastroesophageal reflux disease without esophagitis were also pertinent to this visit.  HPI Sara Mills presents for follow up on type 2 DM.  She is overdue for follow up,  Was last seen September 2015. At which time her a1c was as listed below. Not sure what medications she is supposed to be taking ,  Did not follow up in 3 months as directed    Lab Results  Component Value Date   HGBA1C 7.3* 06/14/2015     Outpatient Prescriptions Prior to Visit  Medication Sig Dispense Refill  . CRESTOR 20 MG tablet TAKE 1 TABLET BY MOUTH EVERY DAY 30 tablet 5  . EST ESTROGENS-METHYLTEST HS 0.625-1.25 MG per tablet TAKE 1 TABLET BY MOUTH DAILY 30 tablet 5  . lisinopril (PRINIVIL,ZESTRIL) 10 MG tablet TAKE 1 TABLET BY MOUTH EVERY DAY 30 tablet 0  . metFORMIN (GLUCOPHAGE) 1000 MG tablet TAKE 1/2 TABLET BY MOUTH TWICE DAILY WITH MEALS 30 tablet 5  . omeprazole-sodium bicarbonate (ZEGERID) 40-1100 MG per capsule Take 1 capsule by mouth daily before breakfast.     . triamcinolone cream (KENALOG) 0.1 % Apply 1 application topically 2 (two) times daily. 30 g 0  . zolpidem (AMBIEN) 10 MG tablet TAKE 1 TABLET BY MOUTH AT BEDTIME AS NEEDED 30 tablet 3  . metformin (FORTAMET) 1000 MG (OSM) 24 hr tablet Take 1 tablet (1,000 mg total) by mouth daily with breakfast. 90 tablet 1  . PREMPRO 0.625-5 MG per tablet TAKE 1 TABLET BY MOUTH EVERY DAY 28 tablet 5  . valACYclovir (VALTREX) 500 MG tablet TAKE 1 TABLET BY MOUTH THREE TIMES DAILY 21 tablet 0   No facility-administered medications prior to visit.    Review of Systems;  Patient denies headache, fevers, malaise, unintentional weight  loss, skin rash, eye pain, sinus congestion and sinus pain, sore throat, dysphagia,  hemoptysis , cough, dyspnea, wheezing, chest pain, palpitations, orthopnea, edema, abdominal pain, nausea, melena, diarrhea, constipation, flank pain, dysuria, hematuria, urinary  Frequency, nocturia, numbness, tingling, seizures,  Focal weakness, Loss of consciousness,  Tremor, insomnia, depression, anxiety, and suicidal ideation.      Objective:  BP 132/84 mmHg  Pulse 61  Temp(Src) 98 F (36.7 C) (Oral)  Ht 5' 1.5" (1.562 m)  Wt 159 lb 8 oz (72.349 kg)  BMI 29.65 kg/m2  SpO2 97%  BP Readings from Last 3 Encounters:  06/14/15 132/84  08/12/14 134/76  01/20/14 157/88    Wt Readings from Last 3 Encounters:  06/14/15 159 lb 8 oz (72.349 kg)  08/12/14 165 lb 4 oz (74.957 kg)  01/20/14 171 lb (77.565 kg)    General appearance: alert, cooperative and appears stated age Ears: normal TM's and external ear canals both ears Throat: lips, mucosa, and tongue normal; teeth and gums normal Neck: no adenopathy, no carotid bruit, supple, symmetrical, trachea midline and thyroid not enlarged, symmetric, no tenderness/mass/nodules Back: symmetric, no curvature. ROM normal. No CVA tenderness. Lungs: clear to auscultation bilaterally Heart: regular rate and rhythm, S1, S2 normal, no murmur, click, rub or gallop Abdomen: soft, non-tender; bowel sounds normal; no masses,  no organomegaly Pulses: 2+ and symmetric Skin: Skin color, texture, turgor normal. No rashes or lesions Lymph nodes:  Cervical, supraclavicular, and axillary nodes normal. Foot exam:  Nails are well trimmed,  No callouses,  Sensation intact to microfilament  Lab Results  Component Value Date   HGBA1C 7.3* 06/14/2015   HGBA1C 7.6* 08/08/2014   HGBA1C 7.6* 01/10/2014    Lab Results  Component Value Date   CREATININE 0.88 06/14/2015   CREATININE 0.8 08/08/2014   CREATININE 0.9 01/10/2014    Lab Results  Component Value Date   WBC 5.7  01/27/2013   HGB 11.6* 01/27/2013   HCT 34.7* 01/27/2013   PLT 185.0 01/27/2013   GLUCOSE 108* 06/14/2015   CHOL 160 06/14/2015   TRIG 133.0 06/14/2015   HDL 46.10 06/14/2015   LDLDIRECT 94.0 06/14/2015   LDLCALC 87 06/14/2015   ALT 22 06/14/2015   AST 25 06/14/2015   NA 138 06/14/2015   K 4.3 06/14/2015   CL 103 06/14/2015   CREATININE 0.88 06/14/2015   BUN 13 06/14/2015   CO2 27 06/14/2015   TSH 1.32 08/08/2014   HGBA1C 7.3* 06/14/2015   MICROALBUR <0.7 06/14/2015    No results found.  Assessment & Plan:   Problem List Items Addressed This Visit      Unprioritized   THYROID NODULE    History of nontoxic MNG followed with serial imaging for years by ENT.  Referral last year was done;  no further workup was indicated.  Will repeat her thyroid function at her annual visit.   Lab Results  Component Value Date   TSH 1.32 08/08/2014           GERD    Managed with zegerid once daily. Discussed current controversy regarding prolonged use of PPI in patients without documented Barretts esophagus.  Patient has no evidence of Barett's by June 2013 EGD but has been on PPI therapy for > 5 years (per patient).  Suggested trial of pepcid 20 mg daily.  If GERD symptoms return,  advised her to accept referral for EGD.         Essential hypertension    Well controlled on current regimen. Renal function stable, no changes today.  Lab Results  Component Value Date   CREATININE 0.88 06/14/2015   Lab Results  Component Value Date   NA 138 06/14/2015   K 4.3 06/14/2015   CL 103 06/14/2015   CO2 27 06/14/2015         Obesity (BMI 30.0-34.9)    She is taking a high potency statin for elevated risk of CAD and LDL > 160 and tolerating the medication.  Liver enzymes are normal , no changes today.  Lab Results  Component Value Date   CHOL 160 06/14/2015   HDL 46.10 06/14/2015   LDLCALC 87 06/14/2015   LDLDIRECT 94.0 06/14/2015   TRIG 133.0 06/14/2015   CHOLHDL 3  06/14/2015   Lab Results  Component Value Date   ALT 22 06/14/2015   AST 25 06/14/2015   ALKPHOS 71 06/14/2015   BILITOT 0.4 06/14/2015           Diabetes mellitus type 2 in obese     Diabetes has improved  control on current regimen,of once daily metformin XR ., and she has no proteinuria. Reviewed with her an appropriate diet and lifestyle with minimal emphasis on use of complex carbohydrates (starches, including white potatoes, Cereals, Cookies and cake) and participation in regular exercise. continue metformin to ER   Lab Results  Component Value Date   HGBA1C 7.3* 06/14/2015   Lab Results  Component  Value Date   MICROALBUR <0.7 06/14/2015               Other Visit Diagnoses    Inadequately controlled diabetes mellitus    -  Primary    Relevant Orders    Comprehensive metabolic panel (Completed)    Hemoglobin A1c (Completed)    Lipid panel (Completed)    Microalbumin / creatinine urine ratio (Completed)    LDL cholesterol, direct (Completed)    Screening for breast cancer        Relevant Orders    MM DIGITAL SCREENING BILATERAL       I have discontinued Ms. Goebel PREMPRO. I have also changed her valACYclovir. Additionally, I am having her maintain her omeprazole-sodium bicarbonate, triamcinolone cream, metFORMIN, zolpidem, CRESTOR, EST ESTROGENS-METHYLTEST HS, and lisinopril.  Meds ordered this encounter  Medications  . valACYclovir (VALTREX) 500 MG tablet    Sig: Take 1 tablet (500 mg total) by mouth 3 (three) times daily.    Dispense:  21 tablet    Refill:  0  . DISCONTD: Lidocaine 0.5 % GEL    Sig: Apply twice daily to painful area    Dispense:  100 g    Refill:  0    Medications Discontinued During This Encounter  Medication Reason  . PREMPRO 0.625-5 MG per tablet   . metformin (FORTAMET) 1000 MG (OSM) 24 hr tablet   . valACYclovir (VALTREX) 500 MG tablet Reorder    Follow-up: Return in about 3 months (around 09/14/2015) for follow up  diabetes.  A total of 40 minutes was spent with patient more than half of which was spent in counseling patient on the above mentioned issues , reviewing and explaining recent labs and prior iimaging studies done, and coordination of care.  Crecencio Mc, MD

## 2015-06-14 NOTE — Patient Instructions (Signed)
Diabetes and Standards of Medical Care Diabetes is complicated. You may find that your diabetes team includes a dietitian, nurse, diabetes educator, eye doctor, and more. To help everyone know what is going on and to help you get the care you deserve, the following schedule of care was developed to help keep you on track. Below are the tests, exams, vaccines, medicines, education, and plans you will need. HbA1c test This test shows how well you have controlled your glucose over the past 2-3 months. It is used to see if your diabetes management plan needs to be adjusted.   It is performed at least 2 times a year if you are meeting treatment goals.  It is performed 4 times a year if therapy has changed or if you are not meeting treatment goals. Blood pressure test  This test is performed at every routine medical visit. The goal is less than 140/90 mm Hg for most people, but 130/80 mm Hg in some cases. Ask your health care provider about your goal. Dental exam  Follow up with the dentist regularly. Eye exam  If you are diagnosed with type 1 diabetes as a child, get an exam upon reaching the age of 62 years or older and have had diabetes for 3-5 years. Yearly eye exams are recommended after that initial eye exam.  If you are diagnosed with type 1 diabetes as an adult, get an exam within 5 years of diagnosis and then yearly.  If you are diagnosed with type 2 diabetes, get an exam as soon as possible after the diagnosis and then yearly. Foot care exam  Visual foot exams are performed at every routine medical visit. The exams check for cuts, injuries, or other problems with the feet.  A comprehensive foot exam should be done yearly. This includes visual inspection as well as assessing foot pulses and testing for loss of sensation.  Check your feet nightly for cuts, injuries, or other problems with your feet. Tell your health care provider if anything is not healing. Kidney function test (urine  microalbumin)  This test is performed once a year.  Type 1 diabetes: The first test is performed 5 years after diagnosis.  Type 2 diabetes: The first test is performed at the time of diagnosis.  A serum creatinine and estimated glomerular filtration rate (eGFR) test is done once a year to assess the level of chronic kidney disease (CKD), if present. Lipid profile (cholesterol, HDL, LDL, triglycerides)  Performed every 5 years for most people.  The goal for LDL is less than 100 mg/dL. If you are at high risk, the goal is less than 70 mg/dL.  The goal for HDL is 40 mg/dL-50 mg/dL for men and 50 mg/dL-60 mg/dL for women. An HDL cholesterol of 60 mg/dL or higher gives some protection against heart disease.  The goal for triglycerides is less than 150 mg/dL. Influenza vaccine, pneumococcal vaccine, and hepatitis B vaccine  The influenza vaccine is recommended yearly.  It is recommended that people with diabetes who are over 24 years old get the pneumonia vaccine. In some cases, two separate shots may be given. Ask your health care provider if your pneumonia vaccination is up to date.  The hepatitis B vaccine is also recommended for adults with diabetes. Diabetes self-management education  Education is recommended at diagnosis and ongoing as needed. Treatment plan  Your treatment plan is reviewed at every medical visit. Document Released: 09/22/2009 Document Revised: 04/11/2014 Document Reviewed: 04/27/2013 [facility] Patient Information 2015 [place],  [employer]. This information is not intended to replace advice given to you by your health care provider. Make sure you discuss any questions you have with your health care provider.  

## 2015-06-14 NOTE — Telephone Encounter (Signed)
Lidocaine 5% ointment sent.

## 2015-06-14 NOTE — Progress Notes (Signed)
Pre visit review using our clinic review tool, if applicable. No additional management support is needed unless otherwise documented below in the visit note. 

## 2015-06-14 NOTE — Telephone Encounter (Signed)
Fax from [facility], Lidocaine 0.5% gel "is not made". Options include: Lidocaine 2% jelly gel, Lidocaine 2% topical gel, Lidocaine 3% cream, Lidocaine 4% cream, or Lidocaine 5% topical ointment.

## 2015-06-16 NOTE — Assessment & Plan Note (Signed)
History of nontoxic MNG followed with serial imaging for years by ENT.  Referral last year was done;  no further workup was indicated.  Will repeat her thyroid function at her annual visit.   Lab Results  Component Value Date   TSH 1.32 08/08/2014

## 2015-06-16 NOTE — Assessment & Plan Note (Signed)
Diabetes has improved  control on current regimen,of once daily metformin XR ., and she has no proteinuria. Reviewed with her an appropriate diet and lifestyle with minimal emphasis on use of complex carbohydrates (starches, including white potatoes, Cereals, Cookies and cake) and participation in regular exercise. continue metformin to ER   Lab Results  Component Value Date   HGBA1C 7.3* 06/14/2015   Lab Results  Component Value Date   MICROALBUR <0.7 06/14/2015

## 2015-06-16 NOTE — Assessment & Plan Note (Addendum)
Managed with zegerid once daily. Discussed current controversy regarding prolonged use of PPI in patients without documented Barretts esophagus.  Patient has no evidence of Barett's by June 2013 EGD but has been on PPI therapy for > 5 years (per patient).  Suggested trial of pepcid 20 mg daily.  If GERD symptoms return,  advised her to accept referral for EGD.

## 2015-06-16 NOTE — Assessment & Plan Note (Signed)
She is taking a high potency statin for elevated risk of CAD and LDL > 160 and tolerating the medication.  Liver enzymes are normal , no changes today.  Lab Results  Component Value Date   CHOL 160 06/14/2015   HDL 46.10 06/14/2015   LDLCALC 87 06/14/2015   LDLDIRECT 94.0 06/14/2015   TRIG 133.0 06/14/2015   CHOLHDL 3 06/14/2015   Lab Results  Component Value Date   ALT 22 06/14/2015   AST 25 06/14/2015   ALKPHOS 71 06/14/2015   BILITOT 0.4 06/14/2015

## 2015-06-16 NOTE — Assessment & Plan Note (Signed)
Well controlled on current regimen. Renal function stable, no changes today.  Lab Results  Component Value Date   CREATININE 0.88 06/14/2015   Lab Results  Component Value Date   NA 138 06/14/2015   K 4.3 06/14/2015   CL 103 06/14/2015   CO2 27 06/14/2015

## 2015-06-17 ENCOUNTER — Telehealth: Payer: Self-pay | Admitting: Internal Medicine

## 2015-06-22 ENCOUNTER — Other Ambulatory Visit: Payer: Self-pay | Admitting: Internal Medicine

## 2015-07-05 ENCOUNTER — Encounter: Payer: Self-pay | Admitting: Internal Medicine

## 2015-07-05 ENCOUNTER — Ambulatory Visit (INDEPENDENT_AMBULATORY_CARE_PROVIDER_SITE_OTHER): Payer: 59 | Admitting: Internal Medicine

## 2015-07-05 VITALS — BP 118/76 | HR 71 | Temp 98.6°F | Resp 14 | Ht 61.25 in | Wt 160.2 lb

## 2015-07-05 DIAGNOSIS — Z1211 Encounter for screening for malignant neoplasm of colon: Secondary | ICD-10-CM

## 2015-07-05 DIAGNOSIS — Z0000 Encounter for general adult medical examination without abnormal findings: Secondary | ICD-10-CM

## 2015-07-05 NOTE — Progress Notes (Signed)
Pre-visit discussion using our clinic review tool. No additional management support is needed unless otherwise documented below in the visit note.  

## 2015-07-05 NOTE — Progress Notes (Signed)
Patient ID: Sara Mills, female    DOB: 07-06-1951  Age: 64 y.o. MRN: [id number]  The patient is here for annual  wellness examination and management of other chronic and acute problems.    Hysterectomy ?BSO 1995 Polyps due next 2017 Mammogram  Overdue, last on 2013,  [facility] in process    The risk factors are reflected in the social history.  The roster of all physicians providing medical care to patient - is listed in the Snapshot section of the chart.  Activities of daily living:  The patient is 100% independent in all ADLs: dressing, toileting, feeding as well as independent mobility  Home safety : The patient has smoke detectors in the home. They wear seatbelts.  There are no firearms at home. There is no violence in the home.   There is no risks for hepatitis, STDs or HIV. There is no   history of blood transfusion. They have no travel history to infectious disease endemic areas of the world.  The patient has seen their dentist in the last six month. They have seen their eye doctor in the last year. They admit to slight hearing difficulty with regard to whispered voices and some television programs.  They have deferred audiologic testing in the last year.  They do not  have excessive sun exposure. Discussed the need for sun protection: hats, long sleeves and use of sunscreen if there is significant sun exposure.   Diet: the importance of a healthy diet is discussed. They do have a healthy diet.  The benefits of regular aerobic exercise were discussed. She walks 4 times per week ,  20 minutes.   Depression screen: there are no signs or vegative symptoms of depression- irritability, change in appetite, anhedonia, sadness/tearfullness.  Cognitive assessment: the patient manages all their financial and personal affairs and is actively engaged. They could relate day,date,year and events; recalled 2/3 objects at 3 minutes; performed clock-face test normally.  The following  portions of the patient's history were reviewed and updated as appropriate: allergies, current medications, past family history, past medical history,  past surgical history, past social history  and problem list.  Visual acuity was not assessed per patient preference since she has regular follow up with her ophthalmologist. Hearing and body mass index were assessed and reviewed.   During the course of the visit the patient was educated and counseled about appropriate screening and preventive services including : fall prevention , diabetes screening, nutrition counseling, colorectal cancer screening, and recommended immunizations.    CC: The encounter diagnosis was Encounter for preventive health examination.  History Sara Mills has a past medical history of GERD (gastroesophageal reflux disease); Diabetes mellitus; Hyperlipidemia; and Hypertension.   She has past surgical history that includes Bilateral oophorectomy; Cesarean section (1977 and 1978); Carpal tunnel release (Right); Abdominal hysterectomy (1995 ); Appendectomy; and Bunionectomy.   Her family history includes Cancer (age of onset: 19) in her father; Heart murmur in her mother; Mitral valve prolapse in her mother; Stomach cancer in her father; Stroke in her mother.She reports that she has never smoked. She has never used smokeless tobacco. She reports that she does not drink alcohol or use illicit drugs.  Outpatient Prescriptions Prior to Visit  Medication Sig Dispense Refill  . CRESTOR 20 MG tablet TAKE 1 TABLET BY MOUTH EVERY DAY 30 tablet 5  . lidocaine (XYLOCAINE) 5 % ointment Apply 1 application topically as needed. 35.44 g 0  . lisinopril (PRINIVIL,ZESTRIL) 10 MG tablet TAKE 1  TABLET BY MOUTH EVERY DAY 30 tablet 0  . metFORMIN (GLUCOPHAGE) 1000 MG tablet TAKE 1/2 TABLET BY MOUTH TWICE DAILY WITH MEALS 30 tablet 5  . omeprazole-sodium bicarbonate (ZEGERID) 40-1100 MG per capsule Take 1 capsule by mouth daily before breakfast.      . triamcinolone cream (KENALOG) 0.1 % Apply 1 application topically 2 (two) times daily. 30 g 0  . valACYclovir (VALTREX) 500 MG tablet Take 1 tablet (500 mg total) by mouth 3 (three) times daily. 21 tablet 0  . zolpidem (AMBIEN) 10 MG tablet TAKE 1 TABLET BY MOUTH AT BEDTIME AS NEEDED 30 tablet 3  . EST ESTROGENS-METHYLTEST HS 0.625-1.25 MG per tablet TAKE 1 TABLET BY MOUTH DAILY 30 tablet 5   No facility-administered medications prior to visit.    Review of Systems   Patient denies headache, fevers, malaise, unintentional weight loss, skin rash, eye pain, sinus congestion and sinus pain, sore throat, dysphagia,  hemoptysis , cough, dyspnea, wheezing, chest pain, palpitations, orthopnea, edema, abdominal pain, nausea, melena, diarrhea, constipation, flank pain, dysuria, hematuria, urinary  Frequency, nocturia, numbness, tingling, seizures,  Focal weakness, Loss of consciousness,  Tremor, insomnia, depression, anxiety, and suicidal ideation.      Objective:  BP 118/76 mmHg  Pulse 71  Temp(Src) 98.6 F (37 C) (Oral)  Resp 14  Ht 5' 1.25" (1.556 m)  Wt 160 lb 4 oz (72.689 kg)  BMI 30.02 kg/m2  SpO2 98%  Physical Exam   General appearance: alert, cooperative and appears stated age Head: Normocephalic, without obvious abnormality, atraumatic Eyes: conjunctivae/corneas clear. PERRL, EOM's intact. Fundi benign. Ears: normal TM's and external ear canals both ears Nose: Nares normal. Septum midline. Mucosa normal. No drainage or sinus tenderness. Throat: lips, mucosa, and tongue normal; teeth and gums normal Neck: no adenopathy, no carotid bruit, no JVD, supple, symmetrical, trachea midline and thyroid not enlarged, symmetric, no tenderness/mass/nodules Lungs: clear to auscultation bilaterally Breasts: normal appearance, no masses or tenderness Heart: regular rate and rhythm, S1, S2 normal, no murmur, click, rub or gallop Abdomen: soft, non-tender; bowel sounds normal; no masses,  no  organomegaly Extremities: extremities normal, atraumatic, no cyanosis or edema Pulses: 2+ and symmetric Skin: Skin color, texture, turgor normal. No rashes or lesions Neurologic: Alert and oriented X 3, normal strength and tone. Normal symmetric reflexes. Normal coordination and gait.     Assessment & Plan:   Problem List Items Addressed This Visit      Unprioritized   Encounter for preventive health examination - Primary    Annual wellness  exam was done as well as a comprehensive physical exam and management of acute and chronic conditions .  During the course of the visit the patient was educated and counseled about appropriate screening and preventive services including :  diabetes screening, lipid analysis with projected  10 year  risk for CAD , nutrition counseling, colorectal cancer screening, and recommended immunizations.  Printed recommendations for health maintenance screenings was given.           I have discontinued Ms. Colcord EST ESTROGENS-METHYLTEST HS. I am also having her maintain her omeprazole-sodium bicarbonate, triamcinolone cream, metFORMIN, zolpidem, CRESTOR, valACYclovir, lidocaine, lisinopril, and PREMPRO.  Meds ordered this encounter  Medications  . PREMPRO 0.625-5 MG per tablet    Sig: Take 1 tablet by mouth daily.    Refill:  5    Medications Discontinued During This Encounter  Medication Reason  . EST ESTROGENS-METHYLTEST HS 0.625-1.25 MG per tablet Patient Preference  Follow-up: No Follow-up on file.   Crecencio Mc, MD

## 2015-07-05 NOTE — Patient Instructions (Signed)
Try taking Lactase before any meal contining dairy  Premier  Protein Chocolate Shake may help with the chocolate cravings  Try Breyer's low carb fudgsicle and ice cream bar , and the Dole chocolate covered strawberries and bananas  ("dippers")  Health Maintenance Adopting a healthy lifestyle and getting preventive care can go a long way to promote health and wellness. Talk with your health care provider about what schedule of regular examinations is right for you. This is a good chance for you to check in with your provider about disease prevention and staying healthy. In between checkups, there are plenty of things you can do on your own. Experts have done a lot of research about which lifestyle changes and preventive measures are most likely to keep you healthy. Ask your health care provider for more information. WEIGHT AND DIET  Eat a healthy diet  Be sure to include plenty of vegetables, fruits, low-fat dairy products, and lean protein.  Do not eat a lot of foods high in solid fats, added sugars, or salt.  Get regular exercise. This is one of the most important things you can do for your health.  Most adults should exercise for at least 150 minutes each week. The exercise should increase your heart rate and make you sweat (moderate-intensity exercise).  Most adults should also do strengthening exercises at least twice a week. This is in addition to the moderate-intensity exercise.  Maintain a healthy weight  Body mass index (BMI) is a measurement that can be used to identify possible weight problems. It estimates body fat based on height and weight. Your health care provider can help determine your BMI and help you achieve or maintain a healthy weight.  For females 75 years of age and older:   A BMI below 18.5 is considered underweight.  A BMI of 18.5 to 24.9 is normal.  A BMI of 25 to 29.9 is considered overweight.  A BMI of 30 and above is considered obese.  Watch levels of  cholesterol and blood lipids  You should start having your blood tested for lipids and cholesterol at 62 years of age, then have this test every 5 years.  You may need to have your cholesterol levels checked more often if:  Your lipid or cholesterol levels are high.  You are older than 62 years of age.  You are at high risk for heart disease.  CANCER SCREENING   Lung Cancer  Lung cancer screening is recommended for adults 8-63 years old who are at high risk for lung cancer because of a history of smoking.  A yearly low-dose CT scan of the lungs is recommended for people who:  Currently smoke.  Have quit within the past 15 years.  Have at least a 30-pack-year history of smoking. A pack year is smoking an average of one pack of cigarettes a day for 1 year.  Yearly screening should continue until it has been 15 years since you quit.  Yearly screening should stop if you develop a health problem that would prevent you from having lung cancer treatment.  Breast Cancer  Practice breast self-awareness. This means understanding how your breasts normally appear and feel.  It also means doing regular breast self-exams. Let your health care provider know about any changes, no matter how small.  If you are in your 20s or 30s, you should have a clinical breast exam (CBE) by a health care provider every 1-3 years as part of a regular health exam.  If  you are 40 or older, have a CBE every year. Also consider having a breast X-ray (mammogram) every year.  If you have a family history of breast cancer, talk to your health care provider about genetic screening.  If you are at high risk for breast cancer, talk to your health care provider about having an MRI and a mammogram every year.  Breast cancer gene (BRCA) assessment is recommended for women who have family members with BRCA-related cancers. BRCA-related cancers include:  Breast.  Ovarian.  Tubal.  Peritoneal  cancers.  Results of the assessment will determine the need for genetic counseling and BRCA1 and BRCA2 testing. Cervical Cancer Routine pelvic examinations to screen for cervical cancer are no longer recommended for nonpregnant women who are considered low risk for cancer of the pelvic organs (ovaries, uterus, and vagina) and who do not have symptoms. A pelvic examination may be necessary if you have symptoms including those associated with pelvic infections. Ask your health care provider if a screening pelvic exam is right for you.   The Pap test is the screening test for cervical cancer for women who are considered at risk.  If you had a hysterectomy for a problem that was not cancer or a condition that could lead to cancer, then you no longer need Pap tests.  If you are older than 65 years, and you have had normal Pap tests for the past 10 years, you no longer need to have Pap tests.  If you have had past treatment for cervical cancer or a condition that could lead to cancer, you need Pap tests and screening for cancer for at least 20 years after your treatment.  If you no longer get a Pap test, assess your risk factors if they change (such as having a new sexual partner). This can affect whether you should start being screened again.  Some women have medical problems that increase their chance of getting cervical cancer. If this is the case for you, your health care provider may recommend more frequent screening and Pap tests.  The human papillomavirus (HPV) test is another test that may be used for cervical cancer screening. The HPV test looks for the virus that can cause cell changes in the cervix. The cells collected during the Pap test can be tested for HPV.  The HPV test can be used to screen women 30 years of age and older. Getting tested for HPV can extend the interval between normal Pap tests from three to five years.  An HPV test also should be used to screen women of any age who  have unclear Pap test results.  After 62 years of age, women should have HPV testing as often as Pap tests.  Colorectal Cancer  This type of cancer can be detected and often prevented.  Routine colorectal cancer screening usually begins at 62 years of age and continues through 62 years of age.  Your health care provider may recommend screening at an earlier age if you have risk factors for colon cancer.  Your health care provider may also recommend using home test kits to check for hidden blood in the stool.  A small camera at the end of a tube can be used to examine your colon directly (sigmoidoscopy or colonoscopy). This is done to check for the earliest forms of colorectal cancer.  Routine screening usually begins at age 50.  Direct examination of the colon should be repeated every 5-10 years through 62 years of age. However, you   may need to be screened more often if early forms of precancerous polyps or small growths are found. Skin Cancer  Check your skin from head to toe regularly.  Tell your health care provider about any new moles or changes in moles, especially if there is a change in a mole's shape or color.  Also tell your health care provider if you have a mole that is larger than the size of a pencil eraser.  Always use sunscreen. Apply sunscreen liberally and repeatedly throughout the day.  Protect yourself by wearing long sleeves, pants, a wide-brimmed hat, and sunglasses whenever you are outside. HEART DISEASE, DIABETES, AND HIGH BLOOD PRESSURE   Have your blood pressure checked at least every 1-2 years. High blood pressure causes heart disease and increases the risk of stroke.  If you are between 3 years and 36 years old, ask your health care provider if you should take aspirin to prevent strokes.  Have regular diabetes screenings. This involves taking a blood sample to check your fasting blood sugar level.  If you are at a normal weight and have a low risk for  diabetes, have this test once every three years after 62 years of age.  If you are overweight and have a high risk for diabetes, consider being tested at a younger age or more often. PREVENTING INFECTION  Hepatitis B  If you have a higher risk for hepatitis B, you should be screened for this virus. You are considered at high risk for hepatitis B if:  You were born in a country where hepatitis B is common. Ask your health care provider which countries are considered high risk.  Your parents were born in a high-risk country, and you have not been immunized against hepatitis B (hepatitis B vaccine).  You have HIV or AIDS.  You use needles to inject street drugs.  You live with someone who has hepatitis B.  You have had sex with someone who has hepatitis B.  You get hemodialysis treatment.  You take certain medicines for conditions, including cancer, organ transplantation, and autoimmune conditions. Hepatitis C  Blood testing is recommended for:  Everyone born from [birth year] through 1963.  Anyone with known risk factors for hepatitis C. Sexually transmitted infections (STIs)  You should be screened for sexually transmitted infections (STIs) including gonorrhea and chlamydia if:  You are sexually active and are younger than 62 years of age.  You are older than 62 years of age and your health care provider tells you that you are at risk for this type of infection.  Your sexual activity has changed since you were last screened and you are at an increased risk for chlamydia or gonorrhea. Ask your health care provider if you are at risk.  If you do not have HIV, but are at risk, it may be recommended that you take a prescription medicine daily to prevent HIV infection. This is called pre-exposure prophylaxis (PrEP). You are considered at risk if:  You are sexually active and do not regularly use condoms or know the HIV status of your partner(s).  You take drugs by injection.  You are  sexually active with a partner who has HIV. Talk with your health care provider about whether you are at high risk of being infected with HIV. If you choose to begin PrEP, you should first be tested for HIV. You should then be tested every 3 months for as long as you are taking PrEP.  PREGNANCY   If you  are premenopausal and you may become pregnant, ask your health care provider about preconception counseling.  If you may become pregnant, take 400 to 800 micrograms (mcg) of folic acid every day.  If you want to prevent pregnancy, talk to your health care provider about birth control (contraception). OSTEOPOROSIS AND MENOPAUSE   Osteoporosis is a disease in which the bones lose minerals and strength with aging. This can result in serious bone fractures. Your risk for osteoporosis can be identified using a bone density scan.  If you are 11 years of age or older, or if you are at risk for osteoporosis and fractures, ask your health care provider if you should be screened.  Ask your health care provider whether you should take a calcium or vitamin D supplement to lower your risk for osteoporosis.  Menopause may have certain physical symptoms and risks.  Hormone replacement therapy may reduce some of these symptoms and risks. Talk to your health care provider about whether hormone replacement therapy is right for you.  HOME CARE INSTRUCTIONS   Schedule regular health, dental, and eye exams.  Stay current with your immunizations.   Do not use any tobacco products including cigarettes, chewing tobacco, or electronic cigarettes.  If you are pregnant, do not drink alcohol.  If you are breastfeeding, limit how much and how often you drink alcohol.  Limit alcohol intake to no more than 1 drink per day for nonpregnant women. One drink equals 12 ounces of beer, 5 ounces of wine, or 1 ounces of hard liquor.  Do not use street drugs.  Do not share needles.  Ask your health care provider for  help if you need support or information about quitting drugs.  Tell your health care provider if you often feel depressed.  Tell your health care provider if you have ever been abused or do not feel safe at home. Document Released: 06/10/2011 Document Revised: 04/11/2014 Document Reviewed: 10/27/2013 [facility] Patient Information 2015 [place], [place]. This information is not intended to replace advice given to you by your health care provider. Make sure you discuss any questions you have with your health care provider.

## 2015-07-06 ENCOUNTER — Other Ambulatory Visit: Payer: Self-pay | Admitting: Internal Medicine

## 2015-07-06 DIAGNOSIS — Z1239 Encounter for other screening for malignant neoplasm of breast: Secondary | ICD-10-CM

## 2015-07-07 NOTE — Assessment & Plan Note (Signed)

## 2015-07-08 NOTE — Addendum Note (Signed)
Addended by: Crecencio Mc on: 07/08/2015 05:55 PM   Modules accepted: Level of Service, SmartSet

## 2015-07-12 ENCOUNTER — Inpatient Hospital Stay
Admission: RE | Admit: 2015-07-12 | Discharge: 2015-07-12 | Disposition: A | Discharge status: Home / Self Care | Payer: 59 | Source: Ambulatory Visit | Attending: Internal Medicine | Admitting: Internal Medicine

## 2015-07-12 DIAGNOSIS — Z1239 Encounter for other screening for malignant neoplasm of breast: Secondary | ICD-10-CM

## 2015-07-20 ENCOUNTER — Other Ambulatory Visit: Payer: Self-pay | Admitting: Internal Medicine

## 2015-08-14 NOTE — Telephone Encounter (Signed)
Encounter closed

## 2015-08-17 ENCOUNTER — Ambulatory Visit
Admission: RE | Admit: 2015-08-17 | Discharge: 2015-08-17 | Disposition: A | Discharge status: Home / Self Care | Payer: 59 | Source: Ambulatory Visit | Attending: Internal Medicine | Admitting: Internal Medicine

## 2015-08-17 DIAGNOSIS — Z1239 Encounter for other screening for malignant neoplasm of breast: Secondary | ICD-10-CM

## 2015-09-11 ENCOUNTER — Other Ambulatory Visit: Payer: Self-pay | Admitting: Internal Medicine

## 2015-09-11 NOTE — Telephone Encounter (Signed)
Received a refill request for Prempro. Last refilled 06/21/15 with 5 refills. Currently on patient's historical med list. Okay to refill this medication?

## 2015-09-11 NOTE — Telephone Encounter (Signed)
Ok to refill,  Authorized in epic 

## 2015-09-13 ENCOUNTER — Other Ambulatory Visit: Payer: Self-pay | Admitting: Internal Medicine

## 2015-09-14 ENCOUNTER — Other Ambulatory Visit: Payer: Self-pay | Admitting: Internal Medicine

## 2015-09-15 ENCOUNTER — Encounter: Payer: Self-pay | Admitting: Internal Medicine

## 2015-09-15 ENCOUNTER — Ambulatory Visit (INDEPENDENT_AMBULATORY_CARE_PROVIDER_SITE_OTHER): Payer: 59 | Admitting: Internal Medicine

## 2015-09-15 VITALS — BP 122/74 | HR 66 | Temp 98.4°F | Resp 12 | Ht 61.0 in | Wt 161.2 lb

## 2015-09-15 DIAGNOSIS — E669 Obesity, unspecified: Secondary | ICD-10-CM

## 2015-09-15 DIAGNOSIS — E785 Hyperlipidemia, unspecified: Secondary | ICD-10-CM

## 2015-09-15 DIAGNOSIS — E119 Type 2 diabetes mellitus without complications: Secondary | ICD-10-CM

## 2015-09-15 DIAGNOSIS — Z23 Encounter for immunization: Secondary | ICD-10-CM | POA: Diagnosis not present

## 2015-09-15 DIAGNOSIS — I10 Essential (primary) hypertension: Secondary | ICD-10-CM | POA: Diagnosis not present

## 2015-09-15 DIAGNOSIS — E66811 Obesity, class 1: Secondary | ICD-10-CM

## 2015-09-15 DIAGNOSIS — D649 Anemia, unspecified: Secondary | ICD-10-CM | POA: Diagnosis not present

## 2015-09-15 DIAGNOSIS — E1169 Type 2 diabetes mellitus with other specified complication: Secondary | ICD-10-CM

## 2015-09-15 DIAGNOSIS — Z13820 Encounter for screening for osteoporosis: Secondary | ICD-10-CM

## 2015-09-15 DIAGNOSIS — Z79899 Other long term (current) drug therapy: Secondary | ICD-10-CM | POA: Diagnosis not present

## 2015-09-15 LAB — LIPID PANEL
CHOLESTEROL: 160 mg/dL (ref 0–200)
HDL: 51.4 mg/dL (ref >39.00)
LDL CALC: 79 mg/dL (ref 0–99)
NonHDL: 108.55
Total CHOL/HDL Ratio: 3
Triglycerides: 149 mg/dL (ref 0.0–149.0)
VLDL: 29.8 mg/dL (ref 0.0–40.0)

## 2015-09-15 LAB — COMPREHENSIVE METABOLIC PANEL
ALBUMIN: 4.3 g/dL (ref 3.5–5.2)
ALK PHOS: 69 U/L (ref 39–117)
ALT: 19 U/L (ref 0–35)
AST: 34 U/L (ref 0–37)
BUN: 14 mg/dL (ref 6–23)
CO2: 27 mEq/L (ref 19–32)
Calcium: 9.8 mg/dL (ref 8.4–10.5)
Chloride: 103 mEq/L (ref 96–112)
Creatinine, Ser: 0.94 mg/dL (ref 0.40–1.20)
GFR: 64.1 mL/min (ref >60.00)
Glucose, Bld: 130 mg/dL — ABNORMAL HIGH (ref 70–99)
Potassium: 4.5 mEq/L (ref 3.5–5.1)
Sodium: 139 mEq/L (ref 135–145)
TOTAL PROTEIN: 7.5 g/dL (ref 6.0–8.3)
Total Bilirubin: 0.6 mg/dL (ref 0.2–1.2)

## 2015-09-15 LAB — LDL CHOLESTEROL, DIRECT: Direct LDL: 92 mg/dL

## 2015-09-15 LAB — HEMOGLOBIN A1C: HEMOGLOBIN A1C: 7.3 % — AB (ref 4.6–6.5)

## 2015-09-15 NOTE — Telephone Encounter (Signed)
Ok to refill,  printed rx  

## 2015-09-15 NOTE — Progress Notes (Signed)
Subjective:  Patient ID: Sara Mills, female    DOB: 1951-08-31  Age: 64 y.o. MRN: [id number]  CC: The primary encounter diagnosis was ANEMIA-NOS. Diagnoses of Diabetes mellitus type 2 in obese [facility]), Essential hypertension, Hyperlipidemia, Long-term use of high-risk medication, Need for prophylactic vaccination against Streptococcus pneumoniae (pneumococcus), Encounter for long-term (current) use of other high-risk medications, and Obesity (BMI 30.0-34.9) were also pertinent to this visit.  HPI Sara Mills presents for follow up on hypertension,  Hyperlipidemia and Type 2 DM   She has been checking her blood sugars several times per week,   96 IS LOWEST .  170 HIGHEST  She is not exercising .  She is following a carbohydrate modified diet.  She is taking her medications as advised and  denies medication intolerance and neuropathic symptoms.   Outpatient Prescriptions Prior to Visit  Medication Sig Dispense Refill  . lisinopril (PRINIVIL,ZESTRIL) 10 MG tablet TAKE 1 TABLET BY MOUTH EVERY DAY 30 tablet 5  . metFORMIN (GLUCOPHAGE) 1000 MG tablet TAKE 1/2 TABLET BY MOUTH TWICE DAILY WITH MEALS 30 tablet 3  . omeprazole-sodium bicarbonate (ZEGERID) 40-1100 MG per capsule Take 1 capsule by mouth daily before breakfast.     . PREMPRO 0.625-5 MG per tablet Take 1 tablet by mouth daily.  5  . PREMPRO 0.625-5 MG tablet TAKE 1 TABLET EVERY DAY 28 tablet 3  . rosuvastatin (CRESTOR) 20 MG tablet TAKE 1 TABLET BY MOUTH EVERY DAY 30 tablet 3  . valACYclovir (VALTREX) 500 MG tablet Take 1 tablet (500 mg total) by mouth 3 (three) times daily. 21 tablet 0  . zolpidem (AMBIEN) 10 MG tablet TAKE 1 TABLET BY MOUTH AT BEDTIME AS NEEDED 30 tablet 3  . lidocaine (XYLOCAINE) 5 % ointment Apply 1 application topically as needed. (Patient not taking: Reported on 09/15/2015) 35.44 g 0  . triamcinolone cream (KENALOG) 0.1 % Apply 1 application topically 2 (two) times daily. (Patient not taking: Reported on  09/15/2015) 30 g 0   No facility-administered medications prior to visit.    Review of Systems;  Patient denies headache, fevers, malaise, unintentional weight loss, skin rash, eye pain, sinus congestion and sinus pain, sore throat, dysphagia,  hemoptysis , cough, dyspnea, wheezing, chest pain, palpitations, orthopnea, edema, abdominal pain, nausea, melena, diarrhea, constipation, flank pain, dysuria, hematuria, urinary  Frequency, nocturia, numbness, tingling, seizures,  Focal weakness, Loss of consciousness,  Tremor, insomnia, depression, anxiety, and suicidal ideation.      Objective:  BP 122/74 mmHg  Pulse 66  Temp(Src) 98.4 F (36.9 C) (Oral)  Resp 12  Ht 5\' 1"  (1.549 m)  Wt 161 lb 4 oz (73.143 kg)  BMI 30.48 kg/m2  SpO2 97%  BP Readings from Last 3 Encounters:  09/15/15 122/74  07/05/15 118/76  06/14/15 132/84    Wt Readings from Last 3 Encounters:  09/15/15 161 lb 4 oz (73.143 kg)  07/05/15 160 lb 4 oz (72.689 kg)  06/14/15 159 lb 8 oz (72.349 kg)    General appearance: alert, cooperative and appears stated age Neck: no adenopathy, no carotid bruit, supple, symmetrical, trachea midline and thyroid not enlarged, symmetric, no tenderness/mass/nodules Back: symmetric, no curvature. ROM normal. No CVA tenderness. Lungs: clear to auscultation bilaterally Heart: regular rate and rhythm, S1, S2 normal, no murmur, click, rub or gallop Abdomen: soft, non-tender; bowel sounds normal; no masses,  no organomegaly Pulses: 2+ and symmetric Skin: Skin color, texture, turgor normal. No rashes or lesions Lymph nodes: Cervical, supraclavicular, and axillary  nodes normal.  Lab Results  Component Value Date   HGBA1C 7.3* 09/15/2015   HGBA1C 7.3* 06/14/2015   HGBA1C 7.6* 08/08/2014    Lab Results  Component Value Date   CREATININE 0.94 09/15/2015   CREATININE 0.88 06/14/2015   CREATININE 0.8 08/08/2014    Lab Results  Component Value Date   WBC 5.7 01/27/2013   HGB 11.6*  01/27/2013   HCT 34.7* 01/27/2013   PLT 185.0 01/27/2013   GLUCOSE 130* 09/15/2015   CHOL 160 09/15/2015   TRIG 149.0 09/15/2015   HDL 51.40 09/15/2015   LDLDIRECT 92.0 09/15/2015   LDLCALC 79 09/15/2015   ALT 19 09/15/2015   AST 34 09/15/2015   NA 139 09/15/2015   K 4.5 09/15/2015   CL 103 09/15/2015   CREATININE 0.94 09/15/2015   BUN 14 09/15/2015   CO2 27 09/15/2015   TSH 1.32 08/08/2014   HGBA1C 7.3* 09/15/2015   MICROALBUR <0.7 06/14/2015    Mm Screening Breast Tomo Bilateral  08/17/2015   CLINICAL DATA:  Screening.  EXAM: DIGITAL SCREENING BILATERAL MAMMOGRAM WITH 3D TOMO WITH CAD  COMPARISON:  Previous exam(s).  ACR Breast Density Category b: There are scattered areas of fibroglandular density.  FINDINGS: There are no findings suspicious for malignancy. Images were processed with CAD.  IMPRESSION: No mammographic evidence of malignancy. A result letter of this screening mammogram will be mailed directly to the patient.  RECOMMENDATION: Screening mammogram in one year. (Code:[id number]-01Y)  BI-RADS CATEGORY  1: Negative.   Electronically Signed   By: Andres Shad   On: 08/17/2015 15:45    Assessment & Plan:   Problem List Items Addressed This Visit    Hyperlipidemia    LDL is at goal on current medications. She has no side effects and liver enzymes are normal. No changes today  Lab Results  Component Value Date   CHOL 160 09/15/2015   HDL 51.40 09/15/2015   LDLCALC 79 09/15/2015   LDLDIRECT 92.0 09/15/2015   TRIG 149.0 09/15/2015   CHOLHDL 3 09/15/2015    Lab Results  Component Value Date   ALT 19 09/15/2015   AST 34 09/15/2015   ALKPHOS 69 09/15/2015   BILITOT 0.6 09/15/2015            Relevant Orders   LDL cholesterol, direct (Completed)   Lipid panel (Completed)   Essential hypertension    Well controlled on current regimen. Renal function stable, no changes today.  Lab Results  Component Value Date   CREATININE 0.94 09/15/2015   Lab Results    Component Value Date   NA 139 09/15/2015   K 4.5 09/15/2015   CL 103 09/15/2015   CO2 27 09/15/2015         Obesity (BMI 30.0-34.9)    I have addressed  BMI and recommended wt loss of 10% of body weight over the next 6 months using a low glycemic index diet and regular exercise a minimum of 5 days per week.  Body mass index is 30.48 kg/(m^2).       Diabetes mellitus type 2 in obese ([place])     Diabetes remains controlled  on current regimen,of once daily metformin XR ., and she has no proteinuria. Reviewed with her an appropriate diet and lifestyle with minimal emphasis on use of complex carbohydrates (starches, including white potatoes, Cereals, Cookies and cake) and participation in regular exercise. continue metformin to ER   Lab Results  Component Value Date   HGBA1C 7.3* 09/15/2015  Lab Results  Component Value Date   MICROALBUR <0.7 06/14/2015                Relevant Orders   Hemoglobin A1c (Completed)   Encounter for long-term (current) use of other high-risk medications    Liver enzymes are normal      ANEMIA-NOS - Primary    Other Visit Diagnoses    Need for prophylactic vaccination against Streptococcus pneumoniae (pneumococcus)        Relevant Orders    Pneumococcal polysaccharide vaccine 23-valent greater than or equal to 2yo subcutaneous/IM (Completed)      A total of 25 minutes of face to face time was spent with patient more than half of which was spent in counselling about the above mentioned conditions  and coordination of care   I am having Ms. Haskett maintain her omeprazole-sodium bicarbonate, triamcinolone cream, valACYclovir, lidocaine, PREMPRO, lisinopril, PREMPRO, rosuvastatin, and metFORMIN.  No orders of the defined types were placed in this encounter.    There are no discontinued medications.  Follow-up: Return in about 3 months (around 12/16/2015) for follow up diabetes.   Crecencio Mc, MD

## 2015-09-15 NOTE — Telephone Encounter (Signed)
Please advise 

## 2015-09-15 NOTE — Progress Notes (Signed)
Pre-visit discussion using our clinic review tool. No additional management support is needed unless otherwise documented below in the visit note.  

## 2015-09-15 NOTE — Patient Instructions (Signed)
YOU  ARE DUE FOR A BONE DENSITY TEST, so I will order it for you  i recommend getting the majority of your calcium and Vitamin D  through diet rather than supplements given the recent association of calcium supplements with increased coronary artery calcium scores  Try the almond and cashew milks that most grocery stores  now carry  in the dairy  Section>   They are lactose free:  [employer]k brand Almond Light,  Original formula.  Delicious,  Low carb,  Low cal,  Cholesterol free     You may want to try the "Orgain"  Brand of organic almond milk because it has more protein (10 mg per 8 ounce, compared to 1 gram/8 ounce) than the other brands of almond milk . It has the same amount of calcium as a glass of milk and is cholesterol free and low calorie.  Its available at [facility] and at the Co op

## 2015-09-16 NOTE — Assessment & Plan Note (Signed)
LDL is at goal on current medications. She has no side effects and liver enzymes are normal. No changes today  Lab Results  Component Value Date   CHOL 160 09/15/2015   HDL 51.40 09/15/2015   LDLCALC 79 09/15/2015   LDLDIRECT 92.0 09/15/2015   TRIG 149.0 09/15/2015   CHOLHDL 3 09/15/2015    Lab Results  Component Value Date   ALT 19 09/15/2015   AST 34 09/15/2015   ALKPHOS 69 09/15/2015   BILITOT 0.6 09/15/2015

## 2015-09-16 NOTE — Assessment & Plan Note (Signed)
Liver enzymes are normal.

## 2015-09-16 NOTE — Assessment & Plan Note (Signed)
Well controlled on current regimen. Renal function stable, no changes today.  Lab Results  Component Value Date   CREATININE 0.94 09/15/2015   Lab Results  Component Value Date   NA 139 09/15/2015   K 4.5 09/15/2015   CL 103 09/15/2015   CO2 27 09/15/2015

## 2015-09-16 NOTE — Assessment & Plan Note (Signed)
Diabetes remains controlled  on current regimen,of once daily metformin XR ., and she has no proteinuria. Reviewed with her an appropriate diet and lifestyle with minimal emphasis on use of complex carbohydrates (starches, including white potatoes, Cereals, Cookies and cake) and participation in regular exercise. continue metformin to ER   Lab Results  Component Value Date   HGBA1C 7.3* 09/15/2015   Lab Results  Component Value Date   MICROALBUR <0.7 06/14/2015

## 2015-09-16 NOTE — Assessment & Plan Note (Signed)
I have addressed  BMI and recommended wt loss of 10% of body weight over the next 6 months using a low glycemic index diet and regular exercise a minimum of 5 days per week.  Body mass index is 30.48 kg/(m^2).

## 2015-09-17 ENCOUNTER — Encounter: Payer: Self-pay | Admitting: Internal Medicine

## 2015-09-19 LAB — CBC AND DIFFERENTIAL
HCT: 37 % (ref 36–46)
Hemoglobin: 12.4 g/dL (ref 12.0–16.0)
Platelets: 211 10*3/uL (ref 150–399)
WBC: 8 10^3/mL

## 2015-09-21 ENCOUNTER — Encounter: Payer: Self-pay | Admitting: Internal Medicine

## 2015-09-22 ENCOUNTER — Telehealth: Payer: Self-pay | Admitting: Internal Medicine

## 2015-09-22 ENCOUNTER — Encounter: Payer: Self-pay | Admitting: *Deleted

## 2015-09-22 NOTE — Telephone Encounter (Signed)
CBC and thyroid function are normal.

## 2015-09-22 NOTE — Telephone Encounter (Signed)
Letter mailed

## 2015-09-27 ENCOUNTER — Encounter: Payer: Self-pay | Admitting: Internal Medicine

## 2015-11-01 LAB — HM DIABETES EYE EXAM

## 2015-11-03 ENCOUNTER — Other Ambulatory Visit: Payer: Self-pay | Admitting: Internal Medicine

## 2015-11-08 ENCOUNTER — Encounter: Payer: Self-pay | Admitting: Internal Medicine

## 2015-11-08 LAB — HM COLONOSCOPY

## 2016-01-22 ENCOUNTER — Other Ambulatory Visit: Payer: Self-pay | Admitting: Internal Medicine

## 2016-01-22 DIAGNOSIS — E2839 Other primary ovarian failure: Secondary | ICD-10-CM

## 2016-04-04 ENCOUNTER — Telehealth: Payer: Self-pay | Admitting: Internal Medicine

## 2016-04-04 DIAGNOSIS — Z0000 Encounter for general adult medical examination without abnormal findings: Secondary | ICD-10-CM

## 2016-04-04 MED ORDER — ROSUVASTATIN CALCIUM 20 MG PO TABS: 20.0000 mg | ORAL_TABLET | Freq: Every day | ORAL | Status: DC

## 2016-04-04 MED ORDER — OMEPRAZOLE-SODIUM BICARBONATE 40-1100 MG PO CAPS: 1.0000 | ORAL_CAPSULE | Freq: Every day | ORAL | Status: DC

## 2016-04-04 MED ORDER — METFORMIN HCL 1000 MG PO TABS: ORAL_TABLET | ORAL | Status: DC

## 2016-04-04 MED ORDER — ZOLPIDEM TARTRATE 10 MG PO TABS: ORAL_TABLET | ORAL | Status: DC

## 2016-04-04 MED ORDER — VALACYCLOVIR HCL 500 MG PO TABS: 500.0000 mg | ORAL_TABLET | Freq: Three times a day (TID) | ORAL | Status: DC

## 2016-04-04 MED ORDER — LISINOPRIL 10 MG PO TABS: 10.0000 mg | ORAL_TABLET | Freq: Every day | ORAL | Status: DC

## 2016-04-04 MED ORDER — CONJ ESTROG-MEDROXYPROGEST ACE 0.625-5 MG PO TABS: 1.0000 | ORAL_TABLET | Freq: Every day | ORAL | Status: DC

## 2016-04-04 NOTE — Telephone Encounter (Signed)
Will print rx s .  Fasting labs ordered

## 2016-04-04 NOTE — Telephone Encounter (Signed)
Pt called in about needing lab work and pt also states she needs her medication to be refilled.Pt states she needs the lab work to be coded as preventive care.  Pt would like to be called so she can pick up prescription. Call pt @ [phone]. Thank you!

## 2016-04-04 NOTE — Telephone Encounter (Signed)
I spoke with the patient.  She had to change insurances this year and her new plan is expensive, so she is requesting to make sure when her labs are completed that the only way they will be covered is if they are coded as preventive care.  She also needs to have paperwork prescriptions as the insurance isn't covering many of her meds and so she is going to utilize the Good Rx program and go [facility] for each med.  She would like refills on all of the meds, i explained that you are out of the office and she is fine to wait until next week to get this done.   Please advise if you have nay questions. thanks

## 2016-04-08 NOTE — Telephone Encounter (Signed)
Placed scripts at front desk and left message for patient.

## 2016-04-15 ENCOUNTER — Telehealth: Payer: Self-pay | Admitting: *Deleted

## 2016-04-15 NOTE — Telephone Encounter (Signed)
Patient requested a call in reference to her Rx.

## 2016-04-15 NOTE — Telephone Encounter (Signed)
Patient inquiring about coupons for medication s helped patient find medication discounts on line directed her to sites.

## 2016-04-15 NOTE — Telephone Encounter (Signed)
FYI

## 2016-04-16 ENCOUNTER — Other Ambulatory Visit (INDEPENDENT_AMBULATORY_CARE_PROVIDER_SITE_OTHER): Payer: BLUE CROSS/BLUE SHIELD

## 2016-04-16 DIAGNOSIS — Z0000 Encounter for general adult medical examination without abnormal findings: Secondary | ICD-10-CM

## 2016-04-16 LAB — LIPID PANEL
Cholesterol: 248 mg/dL — ABNORMAL HIGH (ref 0–200)
HDL: 48.9 mg/dL (ref >39.00)
LDL CALC: 162 mg/dL — AB (ref 0–99)
NONHDL: 198.82
Total CHOL/HDL Ratio: 5
Triglycerides: 183 mg/dL — ABNORMAL HIGH (ref 0.0–149.0)
VLDL: 36.6 mg/dL (ref 0.0–40.0)

## 2016-04-16 LAB — COMPREHENSIVE METABOLIC PANEL
ALK PHOS: 67 U/L (ref 39–117)
ALT: 28 U/L (ref 0–35)
AST: 27 U/L (ref 0–37)
Albumin: 4.4 g/dL (ref 3.5–5.2)
BILIRUBIN TOTAL: 0.5 mg/dL (ref 0.2–1.2)
BUN: 14 mg/dL (ref 6–23)
CO2: 29 meq/L (ref 19–32)
CREATININE: 0.85 mg/dL (ref 0.40–1.20)
Calcium: 9.8 mg/dL (ref 8.4–10.5)
Chloride: 103 mEq/L (ref 96–112)
GFR: 71.86 mL/min (ref >60.00)
GLUCOSE: 143 mg/dL — AB (ref 70–99)
Potassium: 4.9 mEq/L (ref 3.5–5.1)
SODIUM: 141 meq/L (ref 135–145)
TOTAL PROTEIN: 7.2 g/dL (ref 6.0–8.3)

## 2016-04-16 LAB — MICROALBUMIN / CREATININE URINE RATIO
CREATININE, U: 209.5 mg/dL
MICROALB UR: 1.9 mg/dL (ref 0.0–1.9)
Microalb Creat Ratio: 0.9 mg/g (ref 0.0–30.0)

## 2016-04-16 LAB — LDL CHOLESTEROL, DIRECT: Direct LDL: 175 mg/dL

## 2016-04-16 LAB — HEMOGLOBIN A1C: Hgb A1c MFr Bld: 7.8 % — ABNORMAL HIGH (ref 4.6–6.5)

## 2016-04-18 ENCOUNTER — Encounter: Payer: Self-pay | Admitting: Internal Medicine

## 2016-04-22 ENCOUNTER — Telehealth: Payer: Self-pay | Admitting: *Deleted

## 2016-04-22 NOTE — Telephone Encounter (Signed)
Spoke with Gerald Stabs at the pharmacy, gave verbal for medication. thanks

## 2016-04-22 NOTE — Telephone Encounter (Signed)
[facility] stated that patients original Rx for metformin was written for 1000 mg aday, he requested a Rx for metformin 500 mg 2x a day.  [phone]

## 2016-07-18 ENCOUNTER — Other Ambulatory Visit: Payer: Self-pay | Admitting: Internal Medicine

## 2016-07-18 DIAGNOSIS — Z1231 Encounter for screening mammogram for malignant neoplasm of breast: Secondary | ICD-10-CM

## 2016-08-09 ENCOUNTER — Other Ambulatory Visit: Payer: Self-pay | Admitting: Internal Medicine

## 2016-08-21 ENCOUNTER — Ambulatory Visit: Payer: BLUE CROSS/BLUE SHIELD

## 2016-09-12 ENCOUNTER — Telehealth: Payer: Self-pay | Admitting: Internal Medicine

## 2016-09-12 DIAGNOSIS — E041 Nontoxic single thyroid nodule: Secondary | ICD-10-CM

## 2016-09-12 DIAGNOSIS — E782 Mixed hyperlipidemia: Secondary | ICD-10-CM

## 2016-09-12 DIAGNOSIS — E1169 Type 2 diabetes mellitus with other specified complication: Secondary | ICD-10-CM

## 2016-09-12 DIAGNOSIS — E669 Obesity, unspecified: Secondary | ICD-10-CM

## 2016-09-12 DIAGNOSIS — D6489 Other specified anemias: Secondary | ICD-10-CM

## 2016-09-12 NOTE — Telephone Encounter (Signed)
Pt called and scheduled an appt for a physical in November. Pt wants to know is she needs blood work before this appt?  Call pt @ [phone]

## 2016-09-13 NOTE — Telephone Encounter (Signed)
fasting labs ordered.

## 2016-09-13 NOTE — Telephone Encounter (Signed)
LAbs were done in May, please advise and order if needed. thanks

## 2016-09-13 NOTE — Telephone Encounter (Signed)
Can be scheduled for labs now, fasting. thanks

## 2016-09-21 ENCOUNTER — Ambulatory Visit: Payer: BLUE CROSS/BLUE SHIELD

## 2016-09-21 ENCOUNTER — Ambulatory Visit (INDEPENDENT_AMBULATORY_CARE_PROVIDER_SITE_OTHER): Payer: BLUE CROSS/BLUE SHIELD

## 2016-09-21 DIAGNOSIS — Z23 Encounter for immunization: Secondary | ICD-10-CM | POA: Diagnosis not present

## 2016-10-15 ENCOUNTER — Other Ambulatory Visit: Payer: BLUE CROSS/BLUE SHIELD

## 2016-10-16 ENCOUNTER — Other Ambulatory Visit (INDEPENDENT_AMBULATORY_CARE_PROVIDER_SITE_OTHER): Payer: BLUE CROSS/BLUE SHIELD

## 2016-10-16 DIAGNOSIS — D6489 Other specified anemias: Secondary | ICD-10-CM

## 2016-10-16 DIAGNOSIS — E1169 Type 2 diabetes mellitus with other specified complication: Secondary | ICD-10-CM

## 2016-10-16 DIAGNOSIS — E041 Nontoxic single thyroid nodule: Secondary | ICD-10-CM

## 2016-10-16 DIAGNOSIS — E782 Mixed hyperlipidemia: Secondary | ICD-10-CM | POA: Diagnosis not present

## 2016-10-16 DIAGNOSIS — E669 Obesity, unspecified: Secondary | ICD-10-CM

## 2016-10-16 LAB — CBC WITH DIFFERENTIAL/PLATELET
BASOS PCT: 0.8 % (ref 0.0–3.0)
Basophils Absolute: 0.1 10*3/uL (ref 0.0–0.1)
Eosinophils Absolute: 0.3 10*3/uL (ref 0.0–0.7)
Eosinophils Relative: 4.6 % (ref 0.0–5.0)
HEMATOCRIT: 40 % (ref 36.0–46.0)
Hemoglobin: 13.1 g/dL (ref 12.0–15.0)
LYMPHS PCT: 29.8 % (ref 12.0–46.0)
Lymphs Abs: 2.2 10*3/uL (ref 0.7–4.0)
MCHC: 32.7 g/dL (ref 30.0–36.0)
MCV: 86.5 fl (ref 78.0–100.0)
MONOS PCT: 7.2 % (ref 3.0–12.0)
Monocytes Absolute: 0.5 10*3/uL (ref 0.1–1.0)
NEUTROS ABS: 4.2 10*3/uL (ref 1.4–7.7)
Neutrophils Relative %: 57.6 % (ref 43.0–77.0)
PLATELETS: 181 10*3/uL (ref 150.0–400.0)
RBC: 4.63 Mil/uL (ref 3.87–5.11)
RDW: 13.9 % (ref 11.5–15.5)
WBC: 7.3 10*3/uL (ref 4.0–10.5)

## 2016-10-16 LAB — LIPID PANEL
CHOLESTEROL: 165 mg/dL (ref 0–200)
HDL: 50.4 mg/dL (ref >39.00)
LDL Cholesterol: 82 mg/dL (ref 0–99)
NonHDL: 114.15
TRIGLYCERIDES: 163 mg/dL — AB (ref 0.0–149.0)
Total CHOL/HDL Ratio: 3
VLDL: 32.6 mg/dL (ref 0.0–40.0)

## 2016-10-16 LAB — HEMOGLOBIN A1C: Hgb A1c MFr Bld: 7.4 % — ABNORMAL HIGH (ref 4.6–6.5)

## 2016-10-16 LAB — MICROALBUMIN / CREATININE URINE RATIO
Creatinine,U: 147.7 mg/dL
Microalb Creat Ratio: 0.9 mg/g (ref 0.0–30.0)
Microalb, Ur: 1.3 mg/dL (ref 0.0–1.9)

## 2016-10-16 LAB — TSH: TSH: 1.57 u[IU]/mL (ref 0.35–4.50)

## 2016-10-16 LAB — LDL CHOLESTEROL, DIRECT: Direct LDL: 98 mg/dL

## 2016-10-18 ENCOUNTER — Encounter: Payer: Self-pay | Admitting: Internal Medicine

## 2016-10-22 ENCOUNTER — Encounter: Payer: BLUE CROSS/BLUE SHIELD | Admitting: Internal Medicine

## 2016-11-25 ENCOUNTER — Other Ambulatory Visit: Payer: Self-pay | Admitting: Internal Medicine

## 2016-11-25 LAB — HM DIABETES EYE EXAM

## 2016-11-26 NOTE — Telephone Encounter (Signed)
Rx faxed

## 2016-11-26 NOTE — Telephone Encounter (Signed)
Okay to refill Ambien? Last filled on 4/27 with 5 refills.  Last OV: 09/15/15 Next OV: 12/04/16

## 2016-11-29 ENCOUNTER — Encounter: Payer: Self-pay | Admitting: Internal Medicine

## 2016-12-04 ENCOUNTER — Telehealth: Payer: Self-pay | Admitting: Internal Medicine

## 2016-12-04 ENCOUNTER — Encounter: Payer: Self-pay | Admitting: Internal Medicine

## 2016-12-04 ENCOUNTER — Ambulatory Visit (INDEPENDENT_AMBULATORY_CARE_PROVIDER_SITE_OTHER): Payer: BLUE CROSS/BLUE SHIELD | Admitting: Internal Medicine

## 2016-12-04 VITALS — BP 130/78 | HR 60 | Temp 98.0°F | Resp 16 | Ht 60.75 in | Wt 159.0 lb

## 2016-12-04 DIAGNOSIS — K219 Gastro-esophageal reflux disease without esophagitis: Secondary | ICD-10-CM | POA: Diagnosis not present

## 2016-12-04 DIAGNOSIS — E041 Nontoxic single thyroid nodule: Secondary | ICD-10-CM

## 2016-12-04 DIAGNOSIS — E785 Hyperlipidemia, unspecified: Principal | ICD-10-CM

## 2016-12-04 DIAGNOSIS — Z79899 Other long term (current) drug therapy: Secondary | ICD-10-CM

## 2016-12-04 DIAGNOSIS — Z1231 Encounter for screening mammogram for malignant neoplasm of breast: Secondary | ICD-10-CM | POA: Diagnosis not present

## 2016-12-04 DIAGNOSIS — R053 Chronic cough: Secondary | ICD-10-CM

## 2016-12-04 DIAGNOSIS — E669 Obesity, unspecified: Secondary | ICD-10-CM

## 2016-12-04 DIAGNOSIS — Z0000 Encounter for general adult medical examination without abnormal findings: Secondary | ICD-10-CM

## 2016-12-04 DIAGNOSIS — R05 Cough: Secondary | ICD-10-CM

## 2016-12-04 DIAGNOSIS — E1169 Type 2 diabetes mellitus with other specified complication: Secondary | ICD-10-CM | POA: Diagnosis not present

## 2016-12-04 DIAGNOSIS — Z1239 Encounter for other screening for malignant neoplasm of breast: Secondary | ICD-10-CM

## 2016-12-04 DIAGNOSIS — E559 Vitamin D deficiency, unspecified: Secondary | ICD-10-CM

## 2016-12-04 LAB — HM DIABETES FOOT EXAM: HM DIABETIC FOOT EXAM: NORMAL

## 2016-12-04 MED ORDER — OMEPRAZOLE 40 MG PO CPDR: 40.0000 mg | DELAYED_RELEASE_CAPSULE | Freq: Every day | ORAL | 1 refills | Status: DC

## 2016-12-04 NOTE — Progress Notes (Signed)
Patient ID: Sara Mills, female    DOB: 1951-07-02  Age: 65 y.o. MRN: [id number]  The patient is here for her  annualwellness examination and management of other chronic and acute problems. Last office visit October 2016    S/p TAH/BSO Last mammo sept 2016 Cardiolite 2015 Colonoscopy 2012 EGD with BRAVO probe Sara Mills) negative, June 2013. Biopsies negative for sprue, H Pylori,  Diabetic eye exam nov 2016  [facility]    The risk factors are reflected in the social history.  The roster of all physicians providing medical care to patient - is listed in the Snapshot section of the chart.  Activities of daily living:  The patient is 100% independent in all ADLs: dressing, toileting, feeding as well as independent mobility  Home safety : The patient has smoke detectors in the home. They wear seatbelts.  There are no firearms at home. There is no violence in the home.   There is no risks for hepatitis, STDs or HIV. There is no   history of blood transfusion. They have no travel history to infectious disease endemic areas of the world.  The patient has seen their dentist in the last six month. They have seen their eye doctor in the last year. They admit to slight hearing difficulty with regard to whispered voices and some television programs.  They have deferred audiologic testing in the last year.  They do not  have excessive sun exposure. Discussed the need for sun protection: hats, long sleeves and use of sunscreen if there is significant sun exposure.   Diet: the importance of a healthy diet is discussed. They do have a healthy diet.  The benefits of regular aerobic exercise were discussed. She walks 4 times per week ,  20 minutes.   Depression screen: there are no signs or vegative symptoms of depression- irritability, change in appetite, anhedonia, sadness/tearfullness.  Cognitive assessment: the patient manages all their financial and personal affairs and is actively engaged. They  could relate day,date,year and events; recalled 2/3 objects at 3 minutes; performed clock-face test normally.  The following portions of the patient's history were reviewed and updated as appropriate: allergies, current medications, past family history, past medical history,  past surgical history, past social history  and problem list.  Visual acuity was not assessed per patient preference since she has regular follow up with her ophthalmologist. Hearing and body mass index were assessed and reviewed.   During the course of the visit the patient was educated and counseled about appropriate screening and preventive services including : fall prevention , diabetes screening, nutrition counseling, colorectal cancer screening, and recommended immunizations.    CC: The primary encounter diagnosis was Long-term use of high-risk medication. Diagnoses of Breast cancer screening, THYROID NODULE, Diabetes mellitus type 2 in obese ([place]), Gastroesophageal reflux disease without esophagitis, Chronic cough, and Encounter for preventive health examination were also pertinent to this visit.  Multiple complaints  1) Bilateral hand numbness occurs every night when supine,  Symptoms spread proximally to to elbow .  Right arm goes numb if she rests on  right elbow.  does not want workup    2) Has not been seen in over a over for diabetes follow up . Quit taking all medications in  May 2017, resumed in Oct 2017.  3) GERD taking zegerid is too $$$.  Taking OTC Prilosec once daily in the morning has persistent globus sensation  And has a nocturnal cough.  4) persistent itchy nodule right  popliteal fossa for the past 6 months.  Removed a tick from area prior to onset of itching ,      DM Type 2 : has been lost to follow up since Oct 2016.  Not checking sugars.   History Sara Mills has a past medical history of Diabetes mellitus; GERD (gastroesophageal reflux disease); Hyperlipidemia; and Hypertension.   She has a  past surgical history that includes Bilateral oophorectomy; Cesarean section (1977 and 1978); Carpal tunnel release (Right); Abdominal hysterectomy (1995 ); Appendectomy; and Bunionectomy.   Her family history includes Cancer (age of onset: 20) in her father; Heart murmur in her mother; Mitral valve prolapse in her mother; Stomach cancer in her father; Stroke in her mother.She reports that she has never smoked. She has never used smokeless tobacco. She reports that she does not drink alcohol or use drugs.  Outpatient Medications Prior to Visit  Medication Sig Dispense Refill  . lisinopril (PRINIVIL,ZESTRIL) 10 MG tablet TAKE 1 TABLET BY MOUTH ONCE A DAY 30 tablet 0  . metFORMIN (GLUCOPHAGE) 500 MG tablet TAKE 1 TABLET BY MOUTH TWICE A DAY WITH MEALS 60 tablet 3  . rosuvastatin (CRESTOR) 20 MG tablet TAKE 1 TABLET BY MOUTH ONCE A DAY 30 tablet 0  . valACYclovir (VALTREX) 500 MG tablet Take 1 tablet (500 mg total) by mouth 3 (three) times daily. 21 tablet 2  . zolpidem (AMBIEN) 10 MG tablet TAKE 1 TABLET BY MOUTH EVERY NIGHT AT BEDTIME AS NEEDED 30 tablet 0  . estrogen, conjugated,-medroxyprogesterone (PREMPRO) 0.625-5 MG tablet Take 1 tablet by mouth daily. 28 tablet 3  . metFORMIN (GLUCOPHAGE) 1000 MG tablet TAKE 1/2 TABLET BY MOUTH TWICE DAILY WITH MEALS 30 tablet 3  . omeprazole-sodium bicarbonate (ZEGERID) 40-1100 MG capsule Take 1 capsule by mouth daily before breakfast. 30 capsule 5  . PREMPRO 0.625-5 MG per tablet Take 1 tablet by mouth daily.  5   No facility-administered medications prior to visit.     Review of Systems   Patient denies headache, fevers, malaise, unintentional weight loss, skin rash, eye pain, sinus congestion and sinus pain, sore throat, dysphagia,  hemoptysis , dyspnea, wheezing, chest pain, palpitations, orthopnea, edema, abdominal pain, nausea, melena, diarrhea, constipation, flank pain, dysuria, hematuria, urinary  Frequency, nocturia,  seizures,  Focal weakness,  Loss of consciousness,  Tremor, insomnia, depression, anxiety, and suicidal ideation.      Objective:  BP 130/78   Pulse 60   Temp 98 F (36.7 C) (Oral)   Resp 16   Ht 5' 0.75" (1.543 m)   Wt 159 lb (72.1 kg)   SpO2 96%   BMI 30.29 kg/m   Physical Exam   General appearance: alert, cooperative and appears stated age Head: Normocephalic, without obvious abnormality, atraumatic Eyes: conjunctivae/corneas clear. PERRL, EOM's intact. Fundi benign. Ears: normal TM's and external ear canals both ears Nose: Nares normal. Septum midline. Mucosa normal. No drainage or sinus tenderness. Throat: lips, mucosa, and tongue normal; teeth and gums normal Neck: no adenopathy, no carotid bruit, no JVD, supple, symmetrical, trachea midline and thyroid not enlarged, symmetric, no tenderness/mass/nodules Lungs: clear to auscultation bilaterally Breasts: normal appearance, no masses or tenderness Heart: regular rate and rhythm, S1, S2 normal, no murmur, click, rub or gallop Abdomen: soft, non-tender; bowel sounds normal; no masses,  no organomegaly Extremities: extremities normal, atraumatic, no cyanosis or edema Pulses: 2+ and symmetric Skin: Right popliteal cystic nodule without erythema or central punctum. Otherwise ,Skin color, texture, turgor normal. No rashes or lesions Neurologic:  Alert and oriented X 3, normal strength and tone. Normal symmetric reflexes. Normal coordination and gait.      Assessment & Plan:   Problem List Items Addressed This Visit    Chronic cough    Advised to treat for GERD and PNH for one month.  If no improvement, workup will include suspension of lisinopril,  chest x ray, ENT evaluation       Diabetes mellitus type 2 in obese ([place])     Diabetes remains controlled  on current regimen,of once daily metformin XR ., and she has no proteinuria. Reviewed with her an appropriate diet and lifestyle with minimal emphasis on use of complex carbohydrates (starches, including  white potatoes, Cereals, Cookies and cake) and participation in regular exercise. continue metformin, lisinopril, Crestor and advised to add daily baby aspirin  Lab Results  Component Value Date   HGBA1C 7.4 (H) 10/16/2016   Lab Results  Component Value Date   MICROALBUR 1.3 10/16/2016                Encounter for preventive health examination    Annual comprehensive preventive exam was done as well as an evaluation and management of chronic conditions .  During the course of the visit the patient was educated and counseled about appropriate screening and preventive services including :  diabetes screening, lipid analysis with projected  10 year  risk for CAD , nutrition counseling, breast, cervical and colorectal cancer screening, and recommended immunizations.  Printed recommendations for health maintenance screenings was given      GERD    Recommend regimen of 40 mg omeprazole in the am, add a second dose before dinner if needed      Relevant Medications   omeprazole (PRILOSEC) 40 MG capsule   THYROID NODULE    Other Visit Diagnoses    Long-term use of high-risk medication    -  Primary   Relevant Orders   Comprehensive metabolic panel (Completed)   Breast cancer screening       Relevant Orders   MM SCREENING BREAST TOMO BILATERAL      I have discontinued Ms. [place] PREMPRO, omeprazole-sodium bicarbonate, estrogen (conjugated)-medroxyprogesterone, and omeprazole. I am also having her start on omeprazole. Additionally, I am having her maintain her valACYclovir, metFORMIN, lisinopril, rosuvastatin, and zolpidem.  Meds ordered this encounter  Medications  . DISCONTD: omeprazole (PRILOSEC) 20 MG capsule    Sig: Take 20 mg by mouth daily.  [name] omeprazole (PRILOSEC) 40 MG capsule    Sig: Take 1 capsule (40 mg total) by mouth daily.    Dispense:  90 capsule    Refill:  1    Medications Discontinued During This Encounter  Medication Reason  . estrogen,  conjugated,-medroxyprogesterone (PREMPRO) 0.625-5 MG tablet Patient Discharge  . metFORMIN (GLUCOPHAGE) 1000 MG tablet Change in therapy  . omeprazole-sodium bicarbonate (ZEGERID) 40-1100 MG capsule Cost of medication  . PREMPRO 0.625-5 MG per tablet Patient Discharge  . omeprazole (PRILOSEC) 20 MG capsule     Follow-up: Return in about 6 months (around 06/04/2017) for follow up diabetes.   Crecencio Mc, MD

## 2016-12-04 NOTE — Patient Instructions (Addendum)
For your cough:  Start omeprazole 40 mg daily in the morning 30 minutes before breakfast.  You can add a second dose before dinner.   Try  Adding  benadryl  Once daily 30 minutes before bedtime.   Flush sinuses once daily with Milta De[facility] Med's sinus rinse   I do recommend getting the shingles vaccine  Shingrx when available.   You may have a retained tick particle that is causing the itching behind your knee    I need to see you every 6 months for diabetes management   Health Maintenance for Postmenopausal Women Introduction Menopause is a normal process in which your reproductive ability comes to an end. This process happens gradually over a span of months to years, usually between the ages of 24 and 86. Menopause is complete when you have missed 12 consecutive menstrual periods. It is important to talk with your health care provider about some of the most common conditions that affect postmenopausal women, such as heart disease, cancer, and bone loss (osteoporosis). Adopting a healthy lifestyle and getting preventive care can help to promote your health and wellness. Those actions can also lower your chances of developing some of these common conditions. What should I know about menopause? During menopause, you may experience a number of symptoms, such as:  Moderate-to-severe hot flashes.  Night sweats.  Decrease in sex drive.  Mood swings.  Headaches.  Tiredness.  Irritability.  Memory problems.  Insomnia. Choosing to treat or not to treat menopausal changes is an individual decision that you make with your health care provider. What should I know about hormone replacement therapy and supplements? Hormone therapy products are effective for treating symptoms that are associated with menopause, such as hot flashes and night sweats. Hormone replacement carries certain risks, especially as you become older. If you are thinking about using estrogen or estrogen with progestin  treatments, discuss the benefits and risks with your health care provider. What should I know about heart disease and stroke? Heart disease, heart attack, and stroke become more likely as you age. This may be due, in part, to the hormonal changes that your body experiences during menopause. These can affect how your body processes dietary fats, triglycerides, and cholesterol. Heart attack and stroke are both medical emergencies. There are many things that you can do to help prevent heart disease and stroke:  Have your blood pressure checked at least every 1-2 years. High blood pressure causes heart disease and increases the risk of stroke.  If you are 62-37 years old, ask your health care provider if you should take aspirin to prevent a heart attack or a stroke.  Do not use any tobacco products, including cigarettes, chewing tobacco, or electronic cigarettes. If you need help quitting, ask your health care provider.  It is important to eat a healthy diet and maintain a healthy weight.  Be sure to include plenty of vegetables, fruits, low-fat dairy products, and lean protein.  Avoid eating foods that are high in solid fats, added sugars, or salt (sodium).  Get regular exercise. This is one of the most important things that you can do for your health.  Try to exercise for at least 150 minutes each week. The type of exercise that you do should increase your heart rate and make you sweat. This is known as moderate-intensity exercise.  Try to do strengthening exercises at least twice each week. Do these in addition to the moderate-intensity exercise.  Know your numbers.Ask your health care provider  to check your cholesterol and your blood glucose. Continue to have your blood tested as directed by your health care provider. What should I know about cancer screening? There are several types of cancer. Take the following steps to reduce your risk and to catch any cancer development as early as  possible. Breast Cancer  Practice breast self-awareness.  This means understanding how your breasts normally appear and feel.  It also means doing regular breast self-exams. Let your health care provider know about any changes, no matter how small.  If you are 45 or older, have a clinician do a breast exam (clinical breast exam or CBE) every year. Depending on your age, family history, and medical history, it may be recommended that you also have a yearly breast X-ray (mammogram).  If you have a family history of breast cancer, talk with your health care provider about genetic screening.  If you are at high risk for breast cancer, talk with your health care provider about having an MRI and a mammogram every year.  Breast cancer (BRCA) gene test is recommended for women who have family members with BRCA-related cancers. Results of the assessment will determine the need for genetic counseling and BRCA1 and for BRCA2 testing. BRCA-related cancers include these types:  Breast. This occurs in males or females.  Ovarian.  Tubal. This may also be called fallopian tube cancer.  Cancer of the abdominal or pelvic lining (peritoneal cancer).  Prostate.  Pancreatic. Cervical, Uterine, and Ovarian Cancer  Your health care provider may recommend that you be screened regularly for cancer of the pelvic organs. These include your ovaries, uterus, and vagina. This screening involves a pelvic exam, which includes checking for microscopic changes to the surface of your cervix (Pap test).  For women ages 21-65, health care providers may recommend a pelvic exam and a Pap test every three years. For women ages 71-65, they may recommend the Pap test and pelvic exam, combined with testing for human papilloma virus (HPV), every five years. Some types of HPV increase your risk of cervical cancer. Testing for HPV may also be done on women of any age who have unclear Pap test results.  Other health care  providers may not recommend any screening for nonpregnant women who are considered low risk for pelvic cancer and have no symptoms. Ask your health care provider if a screening pelvic exam is right for you.  If you have had past treatment for cervical cancer or a condition that could lead to cancer, you need Pap tests and screening for cancer for at least 20 years after your treatment. If Pap tests have been discontinued for you, your risk factors (such as having a new sexual partner) need to be reassessed to determine if you should start having screenings again. Some women have medical problems that increase the chance of getting cervical cancer. In these cases, your health care provider may recommend that you have screening and Pap tests more often.  If you have a family history of uterine cancer or ovarian cancer, talk with your health care provider about genetic screening.  If you have vaginal bleeding after reaching menopause, tell your health care provider.  There are currently no reliable tests available to screen for ovarian cancer. Lung Cancer  Lung cancer screening is recommended for adults 72-15 years old who are at high risk for lung cancer because of a history of smoking. A yearly low-dose CT scan of the lungs is recommended if you:  Currently smoke.  Have a history of at least 30 pack-years of smoking and you currently smoke or have quit within the past 15 years. A pack-year is smoking an average of one pack of cigarettes per day for one year. Yearly screening should:  Continue until it has been 15 years since you quit.  Stop if you develop a health problem that would prevent you from having lung cancer treatment. Colorectal Cancer  This type of cancer can be detected and can often be prevented.  Routine colorectal cancer screening usually begins at age 89 and continues through age 75.  If you have risk factors for colon cancer, your health care provider may recommend that you  be screened at an earlier age.  If you have a family history of colorectal cancer, talk with your health care provider about genetic screening.  Your health care provider may also recommend using home test kits to check for hidden blood in your stool.  A small camera at the end of a tube can be used to examine your colon directly (sigmoidoscopy or colonoscopy). This is done to check for the earliest forms of colorectal cancer.  Direct examination of the colon should be repeated every 5-10 years until age 67. However, if early forms of precancerous polyps or small growths are found or if you have a family history or genetic risk for colorectal cancer, you may need to be screened more often. Skin Cancer  Check your skin from head to toe regularly.  Monitor any moles. Be sure to tell your health care provider:  About any new moles or changes in moles, especially if there is a change in a mole's shape or color.  If you have a mole that is larger than the size of a pencil eraser.  If any of your family members has a history of skin cancer, especially at a young age, talk with your health care provider about genetic screening.  Always use sunscreen. Apply sunscreen liberally and repeatedly throughout the day.  Whenever you are outside, protect yourself by wearing long sleeves, pants, a wide-brimmed hat, and sunglasses. What should I know about osteoporosis? Osteoporosis is a condition in which bone destruction happens more quickly than new bone creation. After menopause, you may be at an increased risk for osteoporosis. To help prevent osteoporosis or the bone fractures that can happen because of osteoporosis, the following is recommended:  If you are 39-55 years old, get at least 1,000 mg of calcium and at least 600 mg of vitamin D per day.  If you are older than age 81 but younger than age 73, get at least 1,200 mg of calcium and at least 600 mg of vitamin D per day.  If you are older than  age 34, get at least 1,200 mg of calcium and at least 800 mg of vitamin D per day. Smoking and excessive alcohol intake increase the risk of osteoporosis. Eat foods that are rich in calcium and vitamin D, and do weight-bearing exercises several times each week as directed by your health care provider. What should I know about how menopause affects my mental health? Depression may occur at any age, but it is more common as you become older. Common symptoms of depression include:  Low or sad mood.  Changes in sleep patterns.  Changes in appetite or eating patterns.  Feeling an overall lack of motivation or enjoyment of activities that you previously enjoyed.  Frequent crying spells. Talk with your health care provider if you think  that you are experiencing depression. What should I know about immunizations? It is important that you get and maintain your immunizations. These include:  Tetanus, diphtheria, and pertussis (Tdap) booster vaccine.  Influenza every year before the flu season begins.  Pneumonia vaccine.  Shingles vaccine. Your health care provider may also recommend other immunizations. This information is not intended to replace advice given to you by your health care provider. Make sure you discuss any questions you have with your health care provider. Document Released: 01/17/2006 Document Revised: 06/14/2016 Document Reviewed: 08/29/2015  2017 [employer]vier

## 2016-12-04 NOTE — Telephone Encounter (Signed)
Pt requested fasting labs to be order before her follow up. Please advise, thank you!

## 2016-12-05 ENCOUNTER — Encounter: Payer: Self-pay | Admitting: Internal Medicine

## 2016-12-05 LAB — COMPREHENSIVE METABOLIC PANEL
ALT: 29 U/L (ref 0–35)
AST: 28 U/L (ref 0–37)
Albumin: 4.6 g/dL (ref 3.5–5.2)
Alkaline Phosphatase: 75 U/L (ref 39–117)
BILIRUBIN TOTAL: 0.4 mg/dL (ref 0.2–1.2)
BUN: 15 mg/dL (ref 6–23)
CO2: 32 mEq/L (ref 19–32)
CREATININE: 0.87 mg/dL (ref 0.40–1.20)
Calcium: 9.8 mg/dL (ref 8.4–10.5)
Chloride: 102 mEq/L (ref 96–112)
GFR: 69.81 mL/min (ref >60.00)
GLUCOSE: 102 mg/dL — AB (ref 70–99)
Potassium: 4.2 mEq/L (ref 3.5–5.1)
SODIUM: 140 meq/L (ref 135–145)
TOTAL PROTEIN: 7.3 g/dL (ref 6.0–8.3)

## 2016-12-05 NOTE — Telephone Encounter (Signed)
Pt needs a Lab appt before 6/28 2018. At least 2-3 days prior to visit

## 2016-12-05 NOTE — Progress Notes (Signed)
Pre-visit discussion using our clinic review tool. No additional management support is needed unless otherwise documented below in the visit note.  

## 2016-12-05 NOTE — Telephone Encounter (Signed)
FASTING LABS ORDERED.  

## 2016-12-07 NOTE — Assessment & Plan Note (Addendum)
Diabetes remains controlled  on current regimen,of once daily metformin XR ., and she has no proteinuria. Reviewed with her an appropriate diet and lifestyle with minimal emphasis on use of complex carbohydrates (starches, including white potatoes, Cereals, Cookies and cake) and participation in regular exercise. continue metformin, lisinopril, Crestor and advised to add daily baby aspirin  Lab Results  Component Value Date   HGBA1C 7.4 (H) 10/16/2016   Lab Results  Component Value Date   MICROALBUR 1.3 10/16/2016

## 2016-12-07 NOTE — Assessment & Plan Note (Addendum)
Advised to treat for GERD and PNH for one month.  If no improvement, workup will include suspension of lisinopril,  chest x ray, ENT evaluation

## 2016-12-07 NOTE — Assessment & Plan Note (Signed)
Recommend regimen of 40 mg omeprazole in the am, add a second dose before dinner if needed

## 2016-12-07 NOTE — Assessment & Plan Note (Signed)
Annual comprehensive preventive exam was done as well as an evaluation and management of chronic conditions .  During the course of the visit the patient was educated and counseled about appropriate screening and preventive services including :  diabetes screening, lipid analysis with projected  10 year  risk for CAD , nutrition counseling, breast, cervical and colorectal cancer screening, and recommended immunizations.  Printed recommendations for health maintenance screenings was given 

## 2016-12-24 ENCOUNTER — Encounter: Payer: Self-pay | Admitting: Internal Medicine

## 2016-12-31 ENCOUNTER — Other Ambulatory Visit: Payer: Self-pay | Admitting: Internal Medicine

## 2017-01-20 ENCOUNTER — Other Ambulatory Visit: Payer: Self-pay | Admitting: Internal Medicine

## 2017-01-20 ENCOUNTER — Ambulatory Visit
Admission: RE | Admit: 2017-01-20 | Discharge: 2017-01-20 | Disposition: A | Discharge status: Home / Self Care | Payer: BLUE CROSS/BLUE SHIELD | Source: Ambulatory Visit | Attending: Internal Medicine | Admitting: Internal Medicine

## 2017-01-20 DIAGNOSIS — Z1239 Encounter for other screening for malignant neoplasm of breast: Secondary | ICD-10-CM

## 2017-01-20 DIAGNOSIS — Z1231 Encounter for screening mammogram for malignant neoplasm of breast: Secondary | ICD-10-CM | POA: Diagnosis not present

## 2017-01-31 ENCOUNTER — Other Ambulatory Visit: Payer: Self-pay | Admitting: Internal Medicine

## 2017-02-04 ENCOUNTER — Telehealth: Payer: Self-pay | Admitting: *Deleted

## 2017-02-04 ENCOUNTER — Other Ambulatory Visit: Payer: Self-pay | Admitting: Internal Medicine

## 2017-02-04 NOTE — Telephone Encounter (Signed)
thanks

## 2017-02-04 NOTE — Telephone Encounter (Signed)
Requested medication refill for : [facility]  Pharmacy:[place] [facility]  Patient Contact: [phone]

## 2017-02-04 NOTE — Telephone Encounter (Signed)
This rx was filled in January 2018 with 5 refills. LMTCB to see if the pt requested the refill.

## 2017-02-04 NOTE — Telephone Encounter (Signed)
LOV: 12/04/16 Next OV: 06/05/17  Zolpidem rx last written 01/01/17  # 30 w/5 rf was never received at [facility] I called pharmacy.  Ok to Rf?

## 2017-02-05 NOTE — Telephone Encounter (Signed)
Refill phoned in today.

## 2017-03-04 ENCOUNTER — Other Ambulatory Visit: Payer: Self-pay | Admitting: Internal Medicine

## 2017-04-14 ENCOUNTER — Ambulatory Visit (INDEPENDENT_AMBULATORY_CARE_PROVIDER_SITE_OTHER): Payer: BLUE CROSS/BLUE SHIELD | Admitting: Family

## 2017-04-14 ENCOUNTER — Encounter: Payer: Self-pay | Admitting: Family

## 2017-04-14 VITALS — BP 124/84 | HR 69 | Temp 98.6°F | Resp 14 | Wt 158.5 lb

## 2017-04-14 DIAGNOSIS — H6992 Unspecified Eustachian tube disorder, left ear: Secondary | ICD-10-CM | POA: Diagnosis not present

## 2017-04-14 MED ORDER — FLUTICASONE PROPIONATE 50 MCG/ACT NA SUSP: 2.0000 | Freq: Every day | NASAL | 2 refills | Status: DC

## 2017-04-14 MED ORDER — PREDNISONE 10 MG PO TABS: ORAL_TABLET | ORAL | 0 refills | Status: DC

## 2017-04-14 NOTE — Progress Notes (Signed)
Left ear irrigated wax removed.  Patient became dizzy during procedure and stated she felt room was spinning.  She was monitored. Joycelyn Schmid was notified and assessed patient.   Patient then stated she felt much better.   She was walked out to lobby.

## 2017-04-14 NOTE — Patient Instructions (Signed)
Suspect eustachian tube dysfunction as discussed  Let me know if not better and I will send you to ENT   Barotitis Media Barotitis media is inflammation of the middle ear. This condition occurs when an auditory tube (eustachian tube) is blocked in one or both ears. These tubes lead from the middle ear to the back of the nose (nasopharynx). This condition typically occurs when you experience changes in pressure, such as when flying or scuba diving. Untreated barotitis media may lead to damage or hearing loss (barotrauma), which may become permanent. What are the causes? This condition may be caused by changes in air pressure from:  Flying.  Scuba diving.  A nearby explosion. What increases the risk? The following factors may make you more likely to develop this condition:  Middle ear infection.  Sinus infection.  A cold.  Environmental allergies.  Small eustachian tubes.  Recent ear surgery. What are the signs or symptoms? Symptoms of this condition may include:  Ear pain.  Hearing loss. In severe cases, symptoms can include:  Dizziness and nausea (vertigo).  Temporary facial paralysis. How is this diagnosed? This condition is diagnosed based on:  A physical exam. Your health care provider may:  Use a device (otoscope) to look into your ear canal and check your eardrum.  Do a test that changes air pressure in the middle ear to check how well the eardrum moves and to see if the eustachian tube is working(tympanogram).  Your medical history. In some cases, your health care provider may have you take a hearing test. You may also be referred to someone who specializes in ear treatment (otolaryngologist, "ENT"). How is this treated? This condition may be treated with:  Medicines to relieve congestion in your nose, sinus, or upper respiratory tract (decongestants).  Techniques to equalize pressure (to "pop" your ears), such as:  Yawning.  Chewing  gum.  Swallowing. In severe cases, you may need surgery to relieve your symptoms or to prevent future inflammation. Follow these instructions at home:  Take over-the-counter and prescription medicines only as told by your health care provider.  Do not put anything into your ears to clean or unplug them. Ear drops will not help.  Keep all follow-up visits as told by your health care provider. This is important. How is this prevented? Using these strategies may help to prevent barotitis media:  Chewing gum with frequent, forceful swallowing during takeoff and landing when flying.  Holding your nose and gently blowing to pop your ears for equalizing pressure changes. This forces air into the eustachian tube.  Yawning during air pressure changes.  Using a nasal decongestant about 30-60 minutes before flying, if you have nasal congestion. Contact a health care provider if:  You have vertigo.  You have hearing loss.  Your symptoms do not get better or they get worse.  You have a fever. Get help right away if:  You have a severe headache, ear pain, and dizziness.  You have balance problems.  You cannot move or feel part of your face.  You have bloody or pus-like drainage from your ears. Summary  Barotitis media is inflammation of the middle ear.  This condition typically occurs when you experience changes in pressure, such as when flying or scuba diving.  You may be at a higher risk for this condition if you have small eustachian tubes, had recent ear surgery, or have allergies, a cold, or sinus or middle ear infection.  This condition may be treated with  medicines or techniques to equalize pressure in your ears.  Strategies can be used to help prevent barotitis media. This information is not intended to replace advice given to you by your health care provider. Make sure you discuss any questions you have with your health care provider. Document Released: 11/22/2000  Document Revised: 10/14/2016 Document Reviewed: 10/14/2016 Elsevier Interactive Patient Education  2017 [employer].

## 2017-04-14 NOTE — Progress Notes (Signed)
Subjective:    Patient ID: Sara Mills, female    DOB: 03/04/1951, 66 y.o.   MRN: [id number]  CC: [name] is a 66 y.o. female who presents today for an acute visit.    HPI: CC: left ear clicking, one week and half, comes and goes. Gets louder at night when laying down. No ear drainage, ear pain, changes in hearing, tinnitus, HA, vision changes. Has tried to use qtips with alcohol, no relief.   'hears crickets.'    HISTORY:  Past Medical History:  Diagnosis Date  . Diabetes mellitus   . GERD (gastroesophageal reflux disease)   . Hyperlipidemia   . Hypertension    Past Surgical History:  Procedure Laterality Date  . ABDOMINAL HYSTERECTOMY  1995    and BSO  . APPENDECTOMY    . BILATERAL OOPHORECTOMY    . BUNIONECTOMY    . CARPAL TUNNEL RELEASE Right   . CESAREAN SECTION  1977 and 1978   Family History  Problem Relation Age of Onset  . Stroke Mother   . Heart murmur Mother   . Mitral valve prolapse Mother   . Cancer Father 69    carcinoid syndrome  . Stomach cancer Father     Allergies: Patient has no known allergies. Current Outpatient Prescriptions on File Prior to Visit  Medication Sig Dispense Refill  . lisinopril (PRINIVIL,ZESTRIL) 10 MG tablet TAKE 1 TABLET BY MOUTH ONCE A DAY 30 tablet 5  . metFORMIN (GLUCOPHAGE) 500 MG tablet TAKE 1 TABLET BY MOUTH TWICE A DAY WITH MEALS. 60 tablet 5  . omeprazole (PRILOSEC) 40 MG capsule TAKE 1 CAPSULE BY MOUTH ONCE DAILY 90 capsule 0  . rosuvastatin (CRESTOR) 20 MG tablet TAKE 1 TABLET BY MOUTH ONCE A DAY 30 tablet 5  . valACYclovir (VALTREX) 500 MG tablet Take 1 tablet (500 mg total) by mouth 3 (three) times daily. 21 tablet 2  . zolpidem (AMBIEN) 10 MG tablet TAKE 1 TABLET BY MOUTH AT BEDTIME AS NEEDED 30 tablet 5   No current facility-administered medications on file prior to visit.     Social History  Substance Use Topics  . Smoking status: Never Smoker  . Smokeless tobacco: Never Used  . Alcohol use No     Review of Systems  Constitutional: Negative for chills, fever and unexpected weight change.  HENT: Negative for congestion, ear discharge, ear pain, hearing loss, postnasal drip and sinus pressure.   Eyes: Negative for visual disturbance.  Respiratory: Negative for cough.   Cardiovascular: Negative for chest pain, palpitations and leg swelling.  Gastrointestinal: Negative for nausea and vomiting.  Musculoskeletal: Negative for arthralgias and myalgias.  Skin: Negative for rash.  Neurological: Negative for headaches.  Hematological: Negative for adenopathy.  Psychiatric/Behavioral: Negative for confusion.      Objective:    BP 124/84 (BP Location: Left Arm, Patient Position: Sitting, Cuff Size: Normal)   Pulse 69   Temp 98.6 F (37 C) (Oral)   Resp 14   Wt 158 lb 8 oz (71.9 kg)   SpO2 97%   BMI 30.20 kg/m    Physical Exam  Constitutional: She appears well-developed and well-nourished.  HENT:  Head: Normocephalic and atraumatic.  Right Ear: Hearing, tympanic membrane, external ear and ear canal normal. No drainage, swelling or tenderness. No foreign bodies. Tympanic membrane is not erythematous and not bulging. No middle ear effusion. No decreased hearing is noted.  Left Ear: Hearing, tympanic membrane, external ear and ear canal normal. No  drainage, swelling or tenderness. No foreign bodies. Tympanic membrane is not erythematous and not bulging.  No middle ear effusion. No decreased hearing is noted.  Nose: Nose normal. No rhinorrhea. Right sinus exhibits no maxillary sinus tenderness and no frontal sinus tenderness. Left sinus exhibits no maxillary sinus tenderness and no frontal sinus tenderness.  Mouth/Throat: Uvula is midline, oropharynx is clear and moist and mucous membranes are normal. No oropharyngeal exudate, posterior oropharyngeal edema, posterior oropharyngeal erythema or tonsillar abscesses.  Cerumen noted left ear canal.  Eyes: Conjunctivae are normal.    Cardiovascular: Regular rhythm, normal heart sounds and normal pulses.   Pulmonary/Chest: Effort normal and breath sounds normal. She has no wheezes. She has no rhonchi. She has no rales.  Lymphadenopathy:       Head (right side): No submental, no submandibular, no tonsillar, no preauricular, no posterior auricular and no occipital adenopathy present.       Head (left side): No submental, no submandibular, no tonsillar, no preauricular, no posterior auricular and no occipital adenopathy present.    She has no cervical adenopathy.  Neurological: She is alert.  Skin: Skin is warm and dry.  Psychiatric: She has a normal mood and affect. Her speech is normal and behavior is normal. Thought content normal.  Vitals reviewed.     cerumen impaction, left ear, resolved with irrigation by CMA in both left ear.  After which, I also examined patient and the EAC's and TM's are clear. Patient tolerated procedure well.  Assessment & Plan:   1. Disorder of left eustachian tube Symptoms most consistent with eustachian tube dysfunction, Will try prednisone taper, Flonase. If no improvement, referral to ear nose and throat.Return precautions given.   - predniSONE (DELTASONE) 10 MG tablet; Take 40 mg by mouth on day 1, then taper 10 mg daily until gone  Dispense: 10 tablet; Refill: 0 - fluticasone (FLONASE) 50 MCG/ACT nasal spray; Place 2 sprays into both nostrils daily.  Dispense: 16 g; Refill: 2    I am having [name] maintain her valACYclovir, rosuvastatin, lisinopril, metFORMIN, zolpidem, and omeprazole.   No orders of the defined types were placed in this encounter.   Return precautions given.   Risks, benefits, and alternatives of the medications and treatment plan prescribed today were discussed, and patient expressed understanding.   Education regarding symptom management and diagnosis given to patient on AVS.  Continue to follow with Crecencio Mc, MD for routine health maintenance.    Sara Mills and I agreed with plan.   Mable Paris, FNP

## 2017-04-14 NOTE — Progress Notes (Signed)
Pre visit review using our clinic review tool, if applicable. No additional management support is needed unless otherwise documented below in the visit note. 

## 2017-04-30 ENCOUNTER — Emergency Department
Admission: EM | Admit: 2017-04-30 | Discharge: 2017-04-30 | Disposition: A | Discharge status: Home / Self Care | Payer: BLUE CROSS/BLUE SHIELD | Attending: Emergency Medicine | Admitting: Emergency Medicine

## 2017-04-30 ENCOUNTER — Encounter: Payer: Self-pay | Admitting: Emergency Medicine

## 2017-04-30 DIAGNOSIS — E119 Type 2 diabetes mellitus without complications: Secondary | ICD-10-CM | POA: Diagnosis not present

## 2017-04-30 DIAGNOSIS — Z7984 Long term (current) use of oral hypoglycemic drugs: Secondary | ICD-10-CM | POA: Diagnosis not present

## 2017-04-30 DIAGNOSIS — I10 Essential (primary) hypertension: Secondary | ICD-10-CM | POA: Insufficient documentation

## 2017-04-30 DIAGNOSIS — T7840XA Allergy, unspecified, initial encounter: Secondary | ICD-10-CM | POA: Insufficient documentation

## 2017-04-30 MED ORDER — PREDNISONE 20 MG PO TABS: 40.0000 mg | ORAL_TABLET | Freq: Every day | ORAL | 0 refills | Status: DC

## 2017-04-30 MED ORDER — AMIODARONE IV BOLUS ONLY 150 MG/100ML
INTRAVENOUS | Status: AC
  Filled 2017-04-30: qty 100

## 2017-04-30 MED ORDER — AMIODARONE HCL IN DEXTROSE 360-4.14 MG/200ML-% IV SOLN
INTRAVENOUS | Status: AC
  Filled 2017-04-30: qty 200

## 2017-04-30 MED ORDER — PREDNISONE 20 MG PO TABS
40.0000 mg | ORAL_TABLET | Freq: Once | ORAL | Status: AC
  Administered 2017-04-30: 40 mg via ORAL
  Filled 2017-04-30: qty 2

## 2017-04-30 NOTE — ED Triage Notes (Signed)
Patient ambulatory to triage with steady gait, without difficulty or distress noted; pt reports awoke with generalized itching & hives from unknown cause; took 2 bnenadryl at 2am with some relief

## 2017-04-30 NOTE — ED Notes (Signed)
MD at the bedside for pt evaluation  

## 2017-04-30 NOTE — ED Provider Notes (Signed)
[facility] Emergency Department Provider Note  Time seen: 4:04 AM  I have reviewed the triage vital signs and the nursing notes.   HISTORY  Chief Complaint Allergic Reaction    HPI Sara Mills is a 66 y.o. female with a past medical history of diabetes, gastric reflux, hypertension, hyperlipidemia, who presents to the emergency department with a likely allergic reaction. According to the patient she awoke around 2:00 in the morning feeling somewhat nauseated with diffuse itching. Patient states she got up and went to the bathroom and looked in the near, states she was broken out in diffuse hives. She took Benadryl, but felt like her tongue was beginning to swell so she came to the emergency department for evaluation. Denies any history of similar allergic reactions in the past. Denies any new exposures to foods, medications, etc. While waiting to be seen in the emergency department approximately 45 minutes, the patient's symptoms have resolved and her hives are gone. Patient denies any chest pain or trouble breathing. States she feels back to normal currently.  Past Medical History:  Diagnosis Date  . Diabetes mellitus   . GERD (gastroesophageal reflux disease)   . Hyperlipidemia   . Hypertension     Patient Active Problem List   Diagnosis Date Noted  . Encounter for long-term (current) use of other high-risk medications 09/15/2015  . Encounter for preventive health examination 01/12/2014  . Exertional dyspnea 01/12/2014  . Diabetes mellitus type 2 in obese ([place]) 08/26/2012  . Chronic cough 04/21/2012  . Obesity (BMI 30.0-34.9) 03/22/2012  . Premature menopause on hormone replacement therapy 03/20/2012  . Hyperlipidemia   . Essential hypertension   . THYROID NODULE 08/06/2007  . COLONIC POLYPS 07/27/2007  . Hyperlipidemia due to type 2 diabetes mellitus ([place]) 07/27/2007  . Anemia 07/27/2007  . GERD 12/09/2004    Past Surgical History:  Procedure  Laterality Date  . ABDOMINAL HYSTERECTOMY  1995    and BSO  . APPENDECTOMY    . BILATERAL OOPHORECTOMY    . BUNIONECTOMY    . CARPAL TUNNEL RELEASE Right   . [place]    Prior to Admission medications   Medication Sig Start Date End Date Taking? Authorizing Provider  fluticasone (FLONASE) 50 MCG/ACT nasal spray Place 2 sprays into both nostrils daily. 04/14/17   Burnard Hawthorne, FNP  lisinopril (PRINIVIL,ZESTRIL) 10 MG tablet TAKE 1 TABLET BY MOUTH ONCE A DAY 01/01/17   Crecencio Mc, MD  metFORMIN (GLUCOPHAGE) 500 MG tablet TAKE 1 TABLET BY MOUTH TWICE A DAY WITH MEALS. 01/31/17   Crecencio Mc, MD  omeprazole (PRILOSEC) 40 MG capsule TAKE 1 CAPSULE BY MOUTH ONCE DAILY 03/05/17   Crecencio Mc, MD  predniSONE (DELTASONE) 10 MG tablet Take 40 mg by mouth on day 1, then taper 10 mg daily until gone 04/14/17   Burnard Hawthorne, FNP  rosuvastatin (CRESTOR) 20 MG tablet TAKE 1 TABLET BY MOUTH ONCE A DAY 01/01/17   Crecencio Mc, MD  valACYclovir (VALTREX) 500 MG tablet Take 1 tablet (500 mg total) by mouth 3 (three) times daily. 04/04/16   Crecencio Mc, MD  zolpidem (AMBIEN) 10 MG tablet TAKE 1 TABLET BY MOUTH AT BEDTIME AS NEEDED 02/05/17   Crecencio Mc, MD    No Known Allergies  Family History  Problem Relation Age of Onset  . Stroke Mother   . Heart murmur Mother   . Mitral valve prolapse Mother   .  Cancer Father 9       carcinoid syndrome  . Stomach cancer Father     Social History Social History  Substance Use Topics  . Smoking status: Never Smoker  . Smokeless tobacco: Never Used  . Alcohol use No    Review of Systems Constitutional: Negative for fever. ENT: Positive for tongue swelling, now resolved Cardiovascular: Negative for chest pain. Respiratory: Negative for shortness of breath. Gastrointestinal: Negative for abdominal pain. Positive for nausea. Negative for vomiting Skin: Positive for hives, now resolved. Neurological:  Negative for headache All other ROS negative  ____________________________________________   PHYSICAL EXAM:  VITAL SIGNS: ED Triage Vitals  Enc Vitals Group     BP 04/30/17 0311 (!) 148/78     Pulse Rate 04/30/17 0311 69     Resp 04/30/17 0311 18     Temp 04/30/17 0311 98 F (36.7 C)     Temp Source 04/30/17 0311 Oral     SpO2 04/30/17 0311 98 %     Weight 04/30/17 0310 160 lb (72.6 kg)     Height 04/30/17 0310 5\' 1"  (1.549 m)     Head Circumference --      Peak Flow --      Pain Score --      Pain Loc --      Pain Edu? --      Excl. in [place]? --     Constitutional: Alert and oriented. Well appearing and in no distress. Eyes: Normal exam ENT   Head: Normocephalic and atraumatic.   Mouth/Throat: Mucous membranes are moist.No oral edema appreciated Cardiovascular: Normal rate, regular rhythm. No murmur Respiratory: Normal respiratory effort without tachypnea nor retractions. Breath sounds are clear Gastrointestinal: Soft and nontender. No distention. Musculoskeletal: Nontender with normal range of motion in all extremities.  Neurologic:  Normal speech and language. No gross focal neurologic deficits Skin:  Skin is warm, dry and intact. No rash. No hives. Psychiatric: Mood and affect are normal.  ____________________________________________    INITIAL IMPRESSION / ASSESSMENT AND PLAN / ED COURSE  Pertinent labs & imaging results that were available during my care of the patient were reviewed by me and considered in my medical decision making (see chart for details).  The patient presents to the emergency department with symptoms most suggestive of a systemic allergic reaction. Patient took Benadryl prior to arrival in the emergency department, now states the hives and tongue swelling have completely resolved. No hives on my examination, no appreciable oral edema my examination. We will dose prednisone and discharged on the same. Discussed Benadryl use at home.  Discussed return precautions.  ____________________________________________   FINAL CLINICAL IMPRESSION(S) / ED DIAGNOSES  Allergic reaction    Harvest Dark, MD 04/30/17 [phone]

## 2017-06-02 ENCOUNTER — Other Ambulatory Visit (INDEPENDENT_AMBULATORY_CARE_PROVIDER_SITE_OTHER): Payer: BLUE CROSS/BLUE SHIELD

## 2017-06-02 DIAGNOSIS — E1169 Type 2 diabetes mellitus with other specified complication: Secondary | ICD-10-CM

## 2017-06-02 DIAGNOSIS — E559 Vitamin D deficiency, unspecified: Secondary | ICD-10-CM | POA: Diagnosis not present

## 2017-06-02 DIAGNOSIS — E669 Obesity, unspecified: Secondary | ICD-10-CM

## 2017-06-02 DIAGNOSIS — E785 Hyperlipidemia, unspecified: Secondary | ICD-10-CM | POA: Diagnosis not present

## 2017-06-02 LAB — COMPREHENSIVE METABOLIC PANEL
ALK PHOS: 77 U/L (ref 39–117)
ALT: 23 U/L (ref 0–35)
AST: 26 U/L (ref 0–37)
Albumin: 4.5 g/dL (ref 3.5–5.2)
BILIRUBIN TOTAL: 0.5 mg/dL (ref 0.2–1.2)
BUN: 17 mg/dL (ref 6–23)
CO2: 29 meq/L (ref 19–32)
Calcium: 9.7 mg/dL (ref 8.4–10.5)
Chloride: 103 mEq/L (ref 96–112)
Creatinine, Ser: 0.82 mg/dL (ref 0.40–1.20)
GFR: 74.63 mL/min (ref >60.00)
GLUCOSE: 125 mg/dL — AB (ref 70–99)
POTASSIUM: 4 meq/L (ref 3.5–5.1)
SODIUM: 139 meq/L (ref 135–145)
TOTAL PROTEIN: 7.1 g/dL (ref 6.0–8.3)

## 2017-06-02 LAB — LIPID PANEL
CHOL/HDL RATIO: 3
Cholesterol: 139 mg/dL (ref 0–200)
HDL: 48.2 mg/dL (ref >39.00)
LDL CALC: 60 mg/dL (ref 0–99)
NonHDL: 90.7
TRIGLYCERIDES: 152 mg/dL — AB (ref 0.0–149.0)
VLDL: 30.4 mg/dL (ref 0.0–40.0)

## 2017-06-02 LAB — HEMOGLOBIN A1C: Hgb A1c MFr Bld: 8.1 % — ABNORMAL HIGH (ref 4.6–6.5)

## 2017-06-02 LAB — MICROALBUMIN / CREATININE URINE RATIO
CREATININE, U: 138.5 mg/dL
Microalb Creat Ratio: 0.9 mg/g (ref 0.0–30.0)
Microalb, Ur: 1.3 mg/dL (ref 0.0–1.9)

## 2017-06-02 LAB — LDL CHOLESTEROL, DIRECT: Direct LDL: 71 mg/dL

## 2017-06-02 LAB — VITAMIN D 25 HYDROXY (VIT D DEFICIENCY, FRACTURES): VITD: 35.16 ng/mL (ref 30.00–100.00)

## 2017-06-03 ENCOUNTER — Other Ambulatory Visit: Payer: Self-pay | Admitting: Internal Medicine

## 2017-06-05 ENCOUNTER — Encounter: Payer: Self-pay | Admitting: Internal Medicine

## 2017-06-05 ENCOUNTER — Ambulatory Visit (INDEPENDENT_AMBULATORY_CARE_PROVIDER_SITE_OTHER): Payer: BLUE CROSS/BLUE SHIELD | Admitting: Internal Medicine

## 2017-06-05 DIAGNOSIS — K219 Gastro-esophageal reflux disease without esophagitis: Secondary | ICD-10-CM

## 2017-06-05 DIAGNOSIS — E1169 Type 2 diabetes mellitus with other specified complication: Secondary | ICD-10-CM

## 2017-06-05 DIAGNOSIS — I10 Essential (primary) hypertension: Secondary | ICD-10-CM | POA: Diagnosis not present

## 2017-06-05 DIAGNOSIS — M533 Sacrococcygeal disorders, not elsewhere classified: Secondary | ICD-10-CM

## 2017-06-05 DIAGNOSIS — T783XXA Angioneurotic edema, initial encounter: Secondary | ICD-10-CM | POA: Diagnosis not present

## 2017-06-05 DIAGNOSIS — E785 Hyperlipidemia, unspecified: Secondary | ICD-10-CM

## 2017-06-05 MED ORDER — DICLOFENAC SODIUM 75 MG PO TBEC: 75.0000 mg | DELAYED_RELEASE_TABLET | Freq: Two times a day (BID) | ORAL | 1 refills | Status: DC

## 2017-06-05 MED ORDER — EPINEPHRINE 0.3 MG/0.3ML IJ SOAJ: 0.3000 mg | Freq: Once | INTRAMUSCULAR | 1 refills | Status: DC

## 2017-06-05 MED ORDER — GLUCOSE BLOOD VI STRP: ORAL_STRIP | 3 refills | Status: DC

## 2017-06-05 MED ORDER — PREDNISONE 10 MG PO TABS
ORAL_TABLET | ORAL | 0 refills | Status: DC
Start: 2017-06-05 — End: 2018-09-28

## 2017-06-05 NOTE — Patient Instructions (Addendum)
PLEASE CHECK SUGARS ONCE DAILY AND RETURN THE SHEET I 2 WEEKS    For your back pain :  diclofenac 75 mg twice daily  Plus tylenol up to 2000 mg daily   X rays  If mo improvement  on regimen   FOR YOUR LIFE THREATENING ALLERGIC REACTION:    Start taking a daily antihistamine to PREVENT anaphylaxis /angioedema.  START THE PREDNISONE IF YOU FEEL ITCHING. Keep the epi pen  Around if you can afford it

## 2017-06-05 NOTE — Progress Notes (Signed)
Subjective:  Patient ID: Sara Mills, female    DOB: 11-03-1951  Age: 66 y.o. MRN: [id number]  CC: Diagnoses of Angioedema, initial encounter, Essential hypertension, Gastroesophageal reflux disease without esophagitis, Hyperlipidemia due to type 2 diabetes mellitus ([place]), and Sacro-iliac pain were pertinent to this visit.  HPI [name] presents for 6 month follow up on diabetes.  Patient has cc of hip and back pain  Started last summer,  Has been bad for the past month , occurring intermittently  .  Taking aleve or motrin twice daily . Not radiating beyond buttock.    Recent labs reviewed in detail with patient .   Lab Results  Component Value Date   HGBA1C 8.1 (H) 06/02/2017     Patient is NOT following a low glycemic index diet , eating fast food multiple times per week  She is  taking metformin regularly without side effects.  Only checking sugars once a week.  Feet have started to developed an uncomfortable burning sensation at night /   She was treated in the ER for an anaphylactic  reaction to unknown trigger that began  in the middle of the night. She thought it may have started with a bug bite on her hand,  because she woke up with a hive on her hadn that quickly spread to her entire body, accompanied by intense itching.  Very shortly after onset she felt that her tongue was beginning to swell.  She took some benadryl immediately, and by the time she was seen by the ED physician (45  Minutes after arrival), her symptoms had resolved so she was not given epinephrine.  She was discharged home with an Rx for prednisone 40 mg daily x 5 days .  She has to had a recurrence.  Does not take a daily antihistamine.    Fasting sugars have been under less than 140 most of the time and post prandials have been under 160 except on rare occasions. Patient is exercising about 3 times per week and intentionally trying to lose weight .  Patient has had an eye exam in the last 12 months and  checks feet regularly for signs of infection.  Patient does not walk barefoot outside,  And denies an numbness tingling or burning in feet. Patient is up to date on all recommended vaccinations  Outpatient Medications Prior to Visit  Medication Sig Dispense Refill  . metFORMIN (GLUCOPHAGE) 500 MG tablet TAKE 1 TABLET BY MOUTH TWICE A DAY WITH MEALS. 60 tablet 5  . omeprazole (PRILOSEC) 40 MG capsule TAKE 1 CAPSULE BY MOUTH ONCE DAILY 90 capsule 1  . rosuvastatin (CRESTOR) 20 MG tablet TAKE 1 TABLET BY MOUTH ONCE A DAY 30 tablet 3  . valACYclovir (VALTREX) 500 MG tablet Take 1 tablet (500 mg total) by mouth 3 (three) times daily. 21 tablet 2  . zolpidem (AMBIEN) 10 MG tablet TAKE 1 TABLET BY MOUTH AT BEDTIME AS NEEDED 30 tablet 5  . lisinopril (PRINIVIL,ZESTRIL) 10 MG tablet TAKE 1 TABLET BY MOUTH ONCE A DAY 30 tablet 3  . fluticasone (FLONASE) 50 MCG/ACT nasal spray Place 2 sprays into both nostrils daily. (Patient not taking: Reported on 06/05/2017) 16 g 2  . predniSONE (DELTASONE) 20 MG tablet Take 2 tablets (40 mg total) by mouth daily. (Patient not taking: Reported on 06/05/2017) 10 tablet 0   No facility-administered medications prior to visit.     Review of Systems;  Patient denies headache, fevers, malaise, unintentional weight loss,  skin rash, eye pain, sinus congestion and sinus pain, sore throat, dysphagia,  hemoptysis , cough, dyspnea, wheezing, chest pain, palpitations, orthopnea, edema, abdominal pain, nausea, melena, diarrhea, constipation, flank pain, dysuria, hematuria, urinary  Frequency, nocturia, numbness, tingling, seizures,  Focal weakness, Loss of consciousness,  Tremor, insomnia, depression, anxiety, and suicidal ideation.      Objective:  BP 138/78 (BP Location: Left Arm, Patient Position: Sitting, Cuff Size: Normal)   Pulse 63   Temp 98.1 F (36.7 C) (Oral)   Resp 16   Ht 5\' 1"  (1.549 m)   Wt 155 lb (70.3 kg)   SpO2 98%   BMI 29.29 kg/m   BP Readings from Last  3 Encounters:  06/05/17 138/78  04/30/17 (!) 148/78  04/14/17 124/84    Wt Readings from Last 3 Encounters:  06/05/17 155 lb (70.3 kg)  04/30/17 160 lb (72.6 kg)  04/14/17 158 lb 8 oz (71.9 kg)    General appearance: alert, cooperative and appears stated age Ears: normal TM's and external ear canals both ears Throat: lips, mucosa, and tongue normal; teeth and gums normal Neck: no adenopathy, no carotid bruit, supple, symmetrical, trachea midline and thyroid not enlarged, symmetric, no tenderness/mass/nodules Back: symmetric, no curvature. ROM normal. No CVA tenderness. Lungs: clear to auscultation bilaterally Heart: regular rate and rhythm, S1, S2 normal, no murmur, click, rub or gallop Abdomen: soft, non-tender; bowel sounds normal; no masses,  no organomegaly Pulses: 2+ and symmetric Skin: Skin color, texture, turgor normal. No rashes or lesions Lymph nodes: Cervical, supraclavicular, and axillary nodes normal.Neuro: CNs 2-12 intact. DTRs 2+/4 in biceps, brachioradialis, patellars and achilles. Muscle strength 5/5 in upper and lower exremities.  Straight leg test negative . cerebellar function normal. Romberg negative.  No pronator drift.   Gait normal.   Lab Results  Component Value Date   HGBA1C 8.1 (H) 06/02/2017   HGBA1C 7.4 (H) 10/16/2016   HGBA1C 7.8 (H) 04/16/2016    Lab Results  Component Value Date   CREATININE 0.82 06/02/2017   CREATININE 0.87 12/04/2016   CREATININE 0.85 04/16/2016    Lab Results  Component Value Date   WBC 7.3 10/16/2016   HGB 13.1 10/16/2016   HCT 40.0 10/16/2016   PLT 181.0 10/16/2016   GLUCOSE 125 (H) 06/02/2017   CHOL 139 06/02/2017   TRIG 152.0 (H) 06/02/2017   HDL 48.20 06/02/2017   LDLDIRECT 71.0 06/02/2017   LDLCALC 60 06/02/2017   ALT 23 06/02/2017   AST 26 06/02/2017   NA 139 06/02/2017   K 4.0 06/02/2017   CL 103 06/02/2017   CREATININE 0.82 06/02/2017   BUN 17 06/02/2017   CO2 29 06/02/2017   TSH 1.57 10/16/2016    HGBA1C 8.1 (H) 06/02/2017   MICROALBUR 1.3 06/02/2017    No results found.  Assessment & Plan:   Problem List Items Addressed This Visit    Sacro-iliac pain    Trial of diclofenac and tylenol.  furhter workup depending on response to empiric therapy      Relevant Medications   diclofenac (VOLTAREN) 75 MG EC tablet   predniSONE (DELTASONE) 10 MG tablet   Hyperlipidemia due to type 2 diabetes mellitus ([place])    LDL is at goal on current medications. She has no side effects and liver enzymes are normal. No changes today  Lab Results  Component Value Date   CHOL 139 06/02/2017   HDL 48.20 06/02/2017   LDLCALC 60 06/02/2017   LDLDIRECT 71.0 06/02/2017   TRIG 152.0 (  H) 06/02/2017   CHOLHDL 3 06/02/2017    Lab Results  Component Value Date   ALT 23 06/02/2017   AST 26 06/02/2017   ALKPHOS 77 06/02/2017   BILITOT 0.5 06/02/2017            Relevant Medications   amLODipine (NORVASC) 2.5 MG tablet   GERD    Recommend continuining  regimen of 40 mg omeprazole daily for one month , then trial of famotidine       Essential hypertension    Stopping ace inhibitor due to recent episode of angioedema.  She has no microalbuminuria .  Starting amlodipine 2.5 mg daily   Lab Results  Component Value Date   CREATININE 0.82 06/02/2017   Lab Results  Component Value Date   MICROALBUR 1.3 06/02/2017         Relevant Medications   amLODipine (NORVASC) 2.5 MG tablet   Angioedema    Given her recent unexplained episode,  And prior history of cough attributed to GERD ,  Will dc ace inhibitor.  Advised to take a daily antihistamine.  RX for prednisone taper and Epinephrine pen given for future needs.        A total of 40 minutes was spent with patient more than half of which was spent in counseling patient on the above mentioned issues , reviewing recent ER visit , explaining recent labs and imaging studies done, and coordination of care. I have discontinued Ms. Woodroof  fluticasone, predniSONE, and lisinopril. I am also having her start on diclofenac, glucose blood, EPINEPHrine, predniSONE, and amLODipine. Additionally, I am having her maintain her valACYclovir, metFORMIN, zolpidem, rosuvastatin, and omeprazole.  Meds ordered this encounter  Medications  . diclofenac (VOLTAREN) 75 MG EC tablet    Sig: Take 1 tablet (75 mg total) by mouth 2 (two) times daily.    Dispense:  60 tablet    Refill:  1  . glucose blood test strip    Sig: Relion meter. Use daily to check blood sugars    Dispense:  100 each    Refill:  3  . EPINEPHrine 0.3 mg/0.3 mL IJ SOAJ injection    Sig: Inject 0.3 mLs (0.3 mg total) into the muscle once.    Dispense:  1 Device    Refill:  1  . predniSONE (DELTASONE) 10 MG tablet    Sig: 6 tablets on Day 1 , then reduce by 1 tablet daily until gone    Dispense:  21 tablet    Refill:  0  . amLODipine (NORVASC) 2.5 MG tablet    Sig: Take 1 tablet (2.5 mg total) by mouth daily.    Dispense:  30 tablet    Refill:  0    Medications Discontinued During This Encounter  Medication Reason  . fluticasone (FLONASE) 50 MCG/ACT nasal spray Patient has not taken in last 30 days  . predniSONE (DELTASONE) 20 MG tablet Therapy completed  . lisinopril (PRINIVIL,ZESTRIL) 10 MG tablet     Follow-up: Return in about 3 months (around 09/05/2017) for follow up diabetes,  A1C ON OR AFTER 9/28.   Crecencio Mc, MD

## 2017-06-08 ENCOUNTER — Telehealth: Payer: Self-pay | Admitting: Internal Medicine

## 2017-06-08 DIAGNOSIS — M533 Sacrococcygeal disorders, not elsewhere classified: Secondary | ICD-10-CM | POA: Insufficient documentation

## 2017-06-08 DIAGNOSIS — T783XXA Angioneurotic edema, initial encounter: Secondary | ICD-10-CM

## 2017-06-08 DIAGNOSIS — W57XXXS Bitten or stung by nonvenomous insect and other nonvenomous arthropods, sequela: Secondary | ICD-10-CM

## 2017-06-08 HISTORY — DX: Angioneurotic edema, initial encounter: T78.3XXA

## 2017-06-08 MED ORDER — AMLODIPINE BESYLATE 2.5 MG PO TABS: 2.5000 mg | ORAL_TABLET | Freq: Every day | ORAL | 0 refills | Status: DC

## 2017-06-08 NOTE — Assessment & Plan Note (Signed)
Recommend continuining  regimen of 40 mg omeprazole daily for one month , then trial of famotidine

## 2017-06-08 NOTE — Assessment & Plan Note (Signed)
LDL is at goal on current medications. She has no side effects and liver enzymes are normal. No changes today  Lab Results  Component Value Date   CHOL 139 06/02/2017   HDL 48.20 06/02/2017   LDLCALC 60 06/02/2017   LDLDIRECT 71.0 06/02/2017   TRIG 152.0 (H) 06/02/2017   CHOLHDL 3 06/02/2017    Lab Results  Component Value Date   ALT 23 06/02/2017   AST 26 06/02/2017   ALKPHOS 77 06/02/2017   BILITOT 0.5 06/02/2017

## 2017-06-08 NOTE — Assessment & Plan Note (Signed)
Trial of diclofenac and tylenol.  furhter workup depending on response to empiric therapy

## 2017-06-08 NOTE — Assessment & Plan Note (Addendum)
Given her recent unexplained episode,  And prior history of cough attributed to GERD ,  Will dc ace inhibitor.  Advised to take a daily antihistamine.  RX for prednisone taper and Epinephrine pen given for future needs.

## 2017-06-08 NOTE — Assessment & Plan Note (Addendum)
Stopping ace inhibitor due to recent episode of angioedema.  She has no microalbuminuria .  Starting amlodipine 2.5 mg daily   Lab Results  Component Value Date   CREATININE 0.82 06/02/2017   Lab Results  Component Value Date   MICROALBUR 1.3 06/02/2017

## 2017-06-08 NOTE — Telephone Encounter (Signed)
Can you please Confirm with a call that she has stopped the  lisinopril per my "my chart"  message.

## 2017-06-09 NOTE — Telephone Encounter (Signed)
Patient has been informed of Dr. Derrel Nip statement with understanding, she has agreed to start amlodipine as advised by Dr.Tullo.  Ph[facility]

## 2017-06-09 NOTE — Telephone Encounter (Signed)
LMTCB

## 2017-06-13 NOTE — Telephone Encounter (Signed)
Left message for patient to return call back.  

## 2017-06-13 NOTE — Telephone Encounter (Signed)
Yes there is a test for alpha gal allergy . I have ordered the panel since I am sure that is the next question

## 2017-06-13 NOTE — Telephone Encounter (Signed)
Patient has scheduled lab appointment.for Monday.

## 2017-06-13 NOTE — Telephone Encounter (Signed)
Isn't alpha gal the actual allergy and there's blood test that check for this? Please advise.

## 2017-06-13 NOTE — Telephone Encounter (Signed)
Pt called about wanting to speak to you regarding if there is a blood test called alphagal. Please advise?  Call pt @ [phone]. Thank you!

## 2017-06-16 ENCOUNTER — Other Ambulatory Visit (INDEPENDENT_AMBULATORY_CARE_PROVIDER_SITE_OTHER): Payer: BLUE CROSS/BLUE SHIELD

## 2017-06-16 DIAGNOSIS — W57XXXS Bitten or stung by nonvenomous insect and other nonvenomous arthropods, sequela: Secondary | ICD-10-CM

## 2017-06-16 DIAGNOSIS — S60569S Insect bite (nonvenomous) of unspecified hand, sequela: Secondary | ICD-10-CM | POA: Diagnosis not present

## 2017-06-20 LAB — ALPHA-GAL PANEL
BEEF IGE: 0.83 kU/L — AB (ref <0.35)
CLASS: 2
Class: 1
GALACTOSE-ALPHA-1,3-GALACTOSE IGE: 3.24 kU/L — AB (ref <0.35)
Lamb/Mutton IgE: 0.56 kU/L — ABNORMAL HIGH (ref <0.35)
Pork IgE: 0.26 kU/L (ref <0.35)

## 2017-06-22 ENCOUNTER — Encounter: Payer: Self-pay | Admitting: Internal Medicine

## 2017-06-24 ENCOUNTER — Other Ambulatory Visit: Payer: Self-pay | Admitting: Internal Medicine

## 2017-06-24 DIAGNOSIS — E756 Lipid storage disorder, unspecified: Secondary | ICD-10-CM

## 2017-06-24 DIAGNOSIS — T783XXA Angioneurotic edema, initial encounter: Secondary | ICD-10-CM

## 2017-06-24 DIAGNOSIS — E8809 Other disorders of plasma-protein metabolism, not elsewhere classified: Secondary | ICD-10-CM

## 2017-07-07 ENCOUNTER — Other Ambulatory Visit: Payer: Self-pay | Admitting: Internal Medicine

## 2017-08-12 ENCOUNTER — Other Ambulatory Visit: Payer: Self-pay | Admitting: Internal Medicine

## 2017-09-08 ENCOUNTER — Ambulatory Visit (INDEPENDENT_AMBULATORY_CARE_PROVIDER_SITE_OTHER): Payer: BLUE CROSS/BLUE SHIELD | Admitting: Internal Medicine

## 2017-09-08 ENCOUNTER — Encounter: Payer: Self-pay | Admitting: Internal Medicine

## 2017-09-08 VITALS — BP 148/88 | HR 58 | Temp 97.6°F | Resp 16 | Ht 61.0 in | Wt 155.8 lb

## 2017-09-08 DIAGNOSIS — E669 Obesity, unspecified: Secondary | ICD-10-CM | POA: Diagnosis not present

## 2017-09-08 DIAGNOSIS — M533 Sacrococcygeal disorders, not elsewhere classified: Secondary | ICD-10-CM | POA: Diagnosis not present

## 2017-09-08 DIAGNOSIS — Z23 Encounter for immunization: Secondary | ICD-10-CM | POA: Diagnosis not present

## 2017-09-08 DIAGNOSIS — E1169 Type 2 diabetes mellitus with other specified complication: Secondary | ICD-10-CM | POA: Diagnosis not present

## 2017-09-08 DIAGNOSIS — I10 Essential (primary) hypertension: Secondary | ICD-10-CM

## 2017-09-08 DIAGNOSIS — E785 Hyperlipidemia, unspecified: Secondary | ICD-10-CM

## 2017-09-08 LAB — LIPID PANEL
CHOLESTEROL: 148 mg/dL (ref 0–200)
HDL: 45.5 mg/dL (ref >39.00)
LDL Cholesterol: 73 mg/dL (ref 0–99)
NonHDL: 102.83
TRIGLYCERIDES: 148 mg/dL (ref 0.0–149.0)
Total CHOL/HDL Ratio: 3
VLDL: 29.6 mg/dL (ref 0.0–40.0)

## 2017-09-08 LAB — COMPREHENSIVE METABOLIC PANEL
ALBUMIN: 4.5 g/dL (ref 3.5–5.2)
ALK PHOS: 76 U/L (ref 39–117)
ALT: 26 U/L (ref 0–35)
AST: 23 U/L (ref 0–37)
BILIRUBIN TOTAL: 0.5 mg/dL (ref 0.2–1.2)
BUN: 14 mg/dL (ref 6–23)
CALCIUM: 9.7 mg/dL (ref 8.4–10.5)
CO2: 26 meq/L (ref 19–32)
Chloride: 103 mEq/L (ref 96–112)
Creatinine, Ser: 0.79 mg/dL (ref 0.40–1.20)
GFR: 77.84 mL/min (ref >60.00)
Glucose, Bld: 124 mg/dL — ABNORMAL HIGH (ref 70–99)
Potassium: 4.3 mEq/L (ref 3.5–5.1)
Sodium: 139 mEq/L (ref 135–145)
Total Protein: 7 g/dL (ref 6.0–8.3)

## 2017-09-08 LAB — POCT GLYCOSYLATED HEMOGLOBIN (HGB A1C): Hemoglobin A1C: 7.3

## 2017-09-08 MED ORDER — AMLODIPINE BESYLATE 5 MG PO TABS: 2.5000 mg | ORAL_TABLET | Freq: Every day | ORAL | 1 refills | Status: DC

## 2017-09-08 MED ORDER — METFORMIN HCL 850 MG PO TABS: 850.0000 mg | ORAL_TABLET | Freq: Two times a day (BID) | ORAL | 0 refills | Status: DC

## 2017-09-08 NOTE — Assessment & Plan Note (Addendum)
Improved control,  but not at goal.  Increasing metformin to 850 mg bid,  Advised to walk after meals containin starch rtc 3 months   Lab Results  Component Value Date   HGBA1C 7.3 09/08/2017

## 2017-09-08 NOTE — Assessment & Plan Note (Signed)
Not at goal.  amlodipine dose increased to 5 mg daily today

## 2017-09-08 NOTE — Progress Notes (Signed)
Subjective:  Patient ID: Sara Mills, female    DOB: 1951/09/24  Age: 66 y.o. MRN: [id number]  CC: The primary encounter diagnosis was Diabetes mellitus type 2 in obese ([place]). Diagnoses of Need for immunization against influenza, Sacro-iliac pain, Hyperlipidemia due to type 2 diabetes mellitus ([place]), and Essential hypertension were also pertinent to this visit.  HPI Sara Mills presents for 3 month follow up on uncontrolled  diabetes.  Patient has no complaints today.  Patient is not following a low glycemic index diet all of the time,  but she  Is  taking all prescribed medications regularly without side effects.  Fasting sugars have been under less than 140 most of the time and post prandials have been under 160 except on rare occasions. Patient is exercising about 3 times per week and intentionally trying to lose weight .  Patient has had an eye exam in the last 12 months and checks feet regularly for signs of infection.  Patient does not walk barefoot outside,  And denies an numbness tingling or burning in feet. Patient is up to date on all recommended vaccination  average BS has been 160 per glucometer.  She has noticed that she has elevated post prandials in the afternoon and late evening . After eating lasagna   Eats bread daily but not every meal.   Loves pasta Breakfast   Is varied  , sometimes cereal  Taking voltaren twice daily for left sided low back pain,,  Now hurting  bilaterally since she received an ESI. Sara Mills Kitchen Pain became unbearable,  Saw [facility] MRI was done by Sara Mills and a cortisone injection was given.  Has persistent  pain  Due to 2 herniated disks and a cyst that is pressing on a nerve . Taking gabapentin prescribed by Sara Mills.    S/p hyserectomy vaginal PAP 2012 normal.   Lab Results  Component Value Date   MICROALBUR 1.3 06/02/2017    Taking  PPI infrequently  Prn GERD. Lab Results  Component Value Date   CHOL 139 06/02/2017   HDL 48.20 06/02/2017     LDLCALC 60 06/02/2017   LDLDIRECT 71.0 06/02/2017   TRIG 152.0 (H) 06/02/2017   CHOLHDL 3 06/02/2017     .    Lab Results  Component Value Date   HGBA1C 7.3 09/08/2017   ions  Outpatient Medications Prior to Visit  Medication Sig Dispense Refill  . diclofenac (VOLTAREN) 75 MG EC tablet TAKE 1 TABLET BY MOUTH TWICE (2) DAILY 60 tablet 3  . glucose blood test strip Relion meter. Use daily to check blood sugars 100 each 3  . omeprazole (PRILOSEC) 40 MG capsule TAKE 1 CAPSULE BY MOUTH ONCE DAILY 90 capsule 1  . predniSONE (DELTASONE) 10 MG tablet 6 tablets on Day 1 , then reduce by 1 tablet daily until gone 21 tablet 0  . rosuvastatin (CRESTOR) 20 MG tablet TAKE 1 TABLET BY MOUTH ONCE A DAY 30 tablet 3  . valACYclovir (VALTREX) 500 MG tablet Take 1 tablet (500 mg total) by mouth 3 (three) times daily. 21 tablet 2  . zolpidem (AMBIEN) 10 MG tablet TAKE 1 TABLET BY MOUTH EVERY NIGHT AT BEDTIME AS NEEDED 30 tablet 3  . amLODipine (NORVASC) 2.5 MG tablet TAKE 1 TABLET BY MOUTH ONCE DAILY 30 tablet 3  . metFORMIN (GLUCOPHAGE) 500 MG tablet TAKE 1 TABLET BY MOUTH TWICE A DAY WITH MEALS 60 tablet 3   No facility-administered medications prior to visit.  Review of Systems;  Patient denies headache, fevers, malaise, unintentional weight loss, skin rash, eye pain, sinus congestion and sinus pain, sore throat, dysphagia,  hemoptysis , cough, dyspnea, wheezing, chest pain, palpitations, orthopnea, edema, abdominal pain, nausea, melena, diarrhea, constipation, flank pain, dysuria, hematuria, urinary  Frequency, nocturia, numbness, tingling, seizures,  Focal weakness, Loss of consciousness,  Tremor, insomnia, depression, anxiety, and suicidal ideation.      Objective:  BP (!) 148/88 (BP Location: Left Arm, Patient Position: Sitting, Cuff Size: Normal)   Pulse (!) 58   Temp 97.6 F (36.4 C) (Oral)   Resp 16   Ht 5\' 1"  (1.549 m)   Wt 155 lb 12.8 oz (70.7 kg)   SpO2 99%   BMI 29.44 kg/m    BP Readings from Last 3 Encounters:  09/08/17 (!) 148/88  06/05/17 138/78  04/30/17 (!) 148/78    Wt Readings from Last 3 Encounters:  09/08/17 155 lb 12.8 oz (70.7 kg)  06/05/17 155 lb (70.3 kg)  04/30/17 160 lb (72.6 kg)    General appearance: alert, cooperative and appears stated age Ears: normal TM's and external ear canals both ears Throat: lips, mucosa, and tongue normal; teeth and gums normal Neck: no adenopathy, no carotid bruit, supple, symmetrical, trachea midline and thyroid not enlarged, symmetric, no tenderness/mass/nodules Back: symmetric, no curvature. ROM normal. No CVA tenderness. Lungs: clear to auscultation bilaterally Heart: regular rate and rhythm, S1, S2 normal, no murmur, click, rub or gallop Abdomen: soft, non-tender; bowel sounds normal; no masses,  no organomegaly Pulses: 2+ and symmetric Skin: Skin color, texture, turgor normal. No rashes or lesions Lymph nodes: Cervical, supraclavicular, and axillary nodes normal.  Lab Results  Component Value Date   HGBA1C 7.3 09/08/2017   HGBA1C 8.1 (H) 06/02/2017   HGBA1C 7.4 (H) 10/16/2016    Lab Results  Component Value Date   CREATININE 0.82 06/02/2017   CREATININE 0.87 12/04/2016   CREATININE 0.85 04/16/2016    Lab Results  Component Value Date   WBC 7.3 10/16/2016   HGB 13.1 10/16/2016   HCT 40.0 10/16/2016   PLT 181.0 10/16/2016   GLUCOSE 125 (H) 06/02/2017   CHOL 139 06/02/2017   TRIG 152.0 (H) 06/02/2017   HDL 48.20 06/02/2017   LDLDIRECT 71.0 06/02/2017   LDLCALC 60 06/02/2017   ALT 23 06/02/2017   AST 26 06/02/2017   NA 139 06/02/2017   K 4.0 06/02/2017   CL 103 06/02/2017   CREATININE 0.82 06/02/2017   BUN 17 06/02/2017   CO2 29 06/02/2017   TSH 1.57 10/16/2016   HGBA1C 7.3 09/08/2017   MICROALBUR 1.3 06/02/2017    No results found.  Assessment & Plan:   Problem List Items Addressed This Visit    Diabetes mellitus type 2 in obese ([place]) - Primary    Improved control,   but not at goal.  Increasing metformin to 850 mg bid,  Advised to walk after meals containin starch rtc 3 months   Lab Results  Component Value Date   HGBA1C 7.3 09/08/2017         Relevant Medications   metFORMIN (GLUCOPHAGE) 850 MG tablet   Other Relevant Orders   POCT HgB A1C (Completed)   Comprehensive metabolic panel   Lipid panel   Essential hypertension    Not at goal.  amlodipine dose increased to 5 mg daily today       Relevant Medications   amLODipine (NORVASC) 5 MG tablet   Hyperlipidemia due to type 2 diabetes  mellitus ([place])    Managed with generic Crestor,  LDL was at goal on in June ,  She has no side effects is due to have liver enzymes today  Lab Results  Component Value Date   CHOL 139 06/02/2017   HDL 48.20 06/02/2017   LDLCALC 60 06/02/2017   LDLDIRECT 71.0 06/02/2017   TRIG 152.0 (H) 06/02/2017   CHOLHDL 3 06/02/2017    Lab Results  Component Value Date   ALT 23 06/02/2017   AST 26 06/02/2017   ALKPHOS 77 06/02/2017   BILITOT 0.5 06/02/2017            Relevant Medications   metFORMIN (GLUCOPHAGE) 850 MG tablet   amLODipine (NORVASC) 5 MG tablet   Sacro-iliac pain    secondary to herniated disks and cyst on spine per patient  Had MRI done by [facility] Sara Mills  . Unsuccessful ESI to aspirate cyst.  Now taking gabapentin and difclofenac ,  Pain is nonradiating        Other Visit Diagnoses    Need for immunization against influenza       Relevant Orders   Flu Vaccine QUAD 36+ mos IM (Completed)      I have changed Ms. Dlugosz metFORMIN and amLODipine. I am also having her maintain her valACYclovir, rosuvastatin, omeprazole, glucose blood, predniSONE, diclofenac, zolpidem, and gabapentin.  Meds ordered this encounter  Medications  . gabapentin (NEURONTIN) 300 MG capsule    Sig: Take 300 mg by mouth 3 (three) times daily.     Refill:  0  . metFORMIN (GLUCOPHAGE) 850 MG tablet    Sig: Take 1 tablet (850 mg total) by mouth 2  (two) times daily with a meal.    Dispense:  180 tablet    Refill:  0  . amLODipine (NORVASC) 5 MG tablet    Sig: Take 0.5 tablets (2.5 mg total) by mouth daily.    Dispense:  90 tablet    Refill:  1    Medications Discontinued During This Encounter  Medication Reason  . metFORMIN (GLUCOPHAGE) 500 MG tablet Reorder  . amLODipine (NORVASC) 2.5 MG tablet Reorder    Follow-up: Return in about 3 months (around 12/09/2017).  A total of 25 minutes of face to face time was spent with patient more than half of which was spent in counselling about the above mentioned conditions  and coordination of care  Zailey Audia, Aris Everts, MD

## 2017-09-08 NOTE — Patient Instructions (Addendum)
  Your diabetes control is improving but not at goal (< 7.0),  So  I am increasing your gabapentin to 850 mg twice daily  I recommend that you  go for a walk after your pasta or bread meals to lower your blood sugars.  For your reflux:  you can try taking famotidine 20 mg prior to [place] dinners!  To  Prevent reflux   I have also increased your AMLODIPINE  DOSE to 5 mg daily for your high blood pressure

## 2017-09-08 NOTE — Assessment & Plan Note (Signed)
Managed with generic Crestor,  LDL was at goal on in June ,  She has no side effects is due to have liver enzymes today  Lab Results  Component Value Date   CHOL 139 06/02/2017   HDL 48.20 06/02/2017   LDLCALC 60 06/02/2017   LDLDIRECT 71.0 06/02/2017   TRIG 152.0 (H) 06/02/2017   CHOLHDL 3 06/02/2017    Lab Results  Component Value Date   ALT 23 06/02/2017   AST 26 06/02/2017   ALKPHOS 77 06/02/2017   BILITOT 0.5 06/02/2017

## 2017-09-08 NOTE — Assessment & Plan Note (Signed)
secondary to herniated disks and cyst on spine per patient  Had MRI done by [facility] Dr Tonita Cong  . Unsuccessful ESI to aspirate cyst.  Now taking gabapentin and difclofenac ,  Pain is nonradiating

## 2017-10-13 ENCOUNTER — Ambulatory Visit: Payer: Self-pay

## 2017-10-13 ENCOUNTER — Other Ambulatory Visit: Payer: Self-pay | Admitting: Internal Medicine

## 2017-10-13 MED ORDER — AMLODIPINE BESYLATE 5 MG PO TABS: 5.0000 mg | ORAL_TABLET | Freq: Every day | ORAL | 1 refills | Status: DC

## 2017-10-13 NOTE — Telephone Encounter (Signed)
Pt. called and inquired about the correct dose of Amlodipine.  Reported she is taking 5 mg., 1 tablet daily.  Stated that she was questioned at the pharmacy as to what dose she has been taking, since she is almost out of medication.  Stated she used to take 0.5 tablet daily, and increased to 1 full tablet, when she saw Dr. Derrel Nip on Oct. 1st.  Confirmed that Amlodipine dose had been increased to 5 mg qd in the 10/1 office note.  Will update the dosage with her pharmacy.  Pt. Agreed.

## 2017-10-20 ENCOUNTER — Other Ambulatory Visit: Payer: Self-pay | Admitting: Internal Medicine

## 2017-11-28 LAB — HM DIABETES EYE EXAM

## 2017-12-04 ENCOUNTER — Other Ambulatory Visit: Payer: Self-pay | Admitting: Internal Medicine

## 2017-12-17 ENCOUNTER — Ambulatory Visit: Payer: BLUE CROSS/BLUE SHIELD | Admitting: Internal Medicine

## 2017-12-17 ENCOUNTER — Encounter: Payer: Self-pay | Admitting: Internal Medicine

## 2017-12-17 VITALS — BP 136/82 | HR 67 | Temp 97.9°F | Resp 15 | Ht 61.0 in | Wt 160.0 lb

## 2017-12-17 DIAGNOSIS — E1169 Type 2 diabetes mellitus with other specified complication: Secondary | ICD-10-CM | POA: Diagnosis not present

## 2017-12-17 DIAGNOSIS — R5383 Other fatigue: Secondary | ICD-10-CM

## 2017-12-17 DIAGNOSIS — E669 Obesity, unspecified: Secondary | ICD-10-CM | POA: Diagnosis not present

## 2017-12-17 DIAGNOSIS — I10 Essential (primary) hypertension: Secondary | ICD-10-CM

## 2017-12-17 DIAGNOSIS — E782 Mixed hyperlipidemia: Secondary | ICD-10-CM

## 2017-12-17 DIAGNOSIS — Z79899 Other long term (current) drug therapy: Secondary | ICD-10-CM | POA: Diagnosis not present

## 2017-12-17 DIAGNOSIS — M533 Sacrococcygeal disorders, not elsewhere classified: Secondary | ICD-10-CM

## 2017-12-17 LAB — COMPREHENSIVE METABOLIC PANEL
ALT: 34 U/L (ref 0–35)
AST: 31 U/L (ref 0–37)
Albumin: 4.5 g/dL (ref 3.5–5.2)
Alkaline Phosphatase: 75 U/L (ref 39–117)
BUN: 19 mg/dL (ref 6–23)
CALCIUM: 9.4 mg/dL (ref 8.4–10.5)
CHLORIDE: 102 meq/L (ref 96–112)
CO2: 28 meq/L (ref 19–32)
Creatinine, Ser: 0.75 mg/dL (ref 0.40–1.20)
GFR: 82.58 mL/min (ref >60.00)
Glucose, Bld: 133 mg/dL — ABNORMAL HIGH (ref 70–99)
POTASSIUM: 4.1 meq/L (ref 3.5–5.1)
Sodium: 139 mEq/L (ref 135–145)
Total Bilirubin: 0.6 mg/dL (ref 0.2–1.2)
Total Protein: 7.3 g/dL (ref 6.0–8.3)

## 2017-12-17 LAB — LIPID PANEL
CHOL/HDL RATIO: 3
Cholesterol: 143 mg/dL (ref 0–200)
HDL: 43.4 mg/dL (ref >39.00)
LDL CALC: 66 mg/dL (ref 0–99)
NonHDL: 99.29
TRIGLYCERIDES: 165 mg/dL — AB (ref 0.0–149.0)
VLDL: 33 mg/dL (ref 0.0–40.0)

## 2017-12-17 LAB — HEPATITIS C ANTIBODY
Hepatitis C Ab: NONREACTIVE
SIGNAL TO CUT-OFF: 0.01 (ref <1.00)

## 2017-12-17 LAB — HEMOGLOBIN A1C: Hgb A1c MFr Bld: 8 % — ABNORMAL HIGH (ref 4.6–6.5)

## 2017-12-17 LAB — VITAMIN B12: Vitamin B-12: 441 pg/mL (ref 211–911)

## 2017-12-17 LAB — HIV ANTIBODY (ROUTINE TESTING W REFLEX): HIV 1&2 Ab, 4th Generation: NONREACTIVE

## 2017-12-17 MED ORDER — HYDROCHLOROTHIAZIDE 12.5 MG PO CAPS: 12.5000 mg | ORAL_CAPSULE | Freq: Every day | ORAL | 1 refills | Status: DC

## 2017-12-17 NOTE — Patient Instructions (Addendum)
You can use up to 2000 mg of acetominophen (tylenol) daily to manage your back pain  If you have a flare,  You can use the prednisone taper for 6 days and suspend the diclofenac during those first 4 days    I am adding 12.5 mg hctz to your amlodipine for management of blood pressure.   Goal is to get your BP between 120 and 130/70 to 80   If your A1c is < 7.0, I'll see you in 6 months,  If not,  3 month follow up is recommended

## 2017-12-17 NOTE — Progress Notes (Signed)
Subjective:  Patient ID: Sara Mills, female    DOB: [birth year]  Age: 67 y.o. MRN: [id number]  CC: The primary encounter diagnosis was Mixed hyperlipidemia. Diagnoses of Diabetes mellitus type 2 in obese [facility]), Long-term use of high-risk medication, Fatigue, unspecified type, Essential hypertension, and Sacro-iliac pain were also pertinent to this visit.  HPI GAILYA TAUER presents for 3 month follow up on diabetes and hypertension .  Patient has no complaints today.  Patient is following a low glycemic index diet and taking all prescribed medications regularly without side effects.  Fasting sugars have been under lover 150  most of the time and post prandials have been 200 or over most  occasions. Patient is exercising about 3 times per week and intentionally trying to lose weight .  Patient has had an eye exam in the last 12 months and checks feet regularly for signs of infection.  Patient does not walk barefoot outside,  And denies an numbness tingling or burning in feet. Patient is up to date on all recommended vaccinations    Seeing [place] [facility] for back pain ,  Has had ESI by [facility] for herniated disks x 2 on right and Synovial cyst on the left pressing on nerve   The ESI seems to have made matters  worse on opposite side,  And she now has pain that radiates down her  left leg,  some days just to the knee .  The pain nis aggravated by sitting in the car,  Ankle starts to feel painful as well .  Taking gabapentin 300 mg bid and diclofenac bid   Lab Results  Component Value Date   [id number] 765 12/17/2017      Lab Results  Component Value Date   HGBA1C 8.0 (H) 12/17/2017    Lab Results  Component Value Date   MICROALBUR 1.3 06/02/2017    Outpatient Medications Prior to Visit  Medication Sig Dispense Refill  . amLODipine (NORVASC) 5 MG tablet Take 1 tablet (5 mg total) daily by mouth. 90 tablet 1  . diclofenac (VOLTAREN) 75 MG EC tablet TAKE 1 TABLET BY MOUTH TWICE (2) DAILY 60  tablet 3  . gabapentin (NEURONTIN) 300 MG capsule Take 300 mg by mouth 3 (three) times daily.   0  . glucose blood test strip Relion meter. Use daily to check blood sugars 100 each 3  . metFORMIN (GLUCOPHAGE) 850 MG tablet TAKE 1 TABLET BY MOUTH TWICE (2) DAILY WITH A MEAL 180 tablet 0  . omeprazole (PRILOSEC) 40 MG capsule TAKE 1 CAPSULE BY MOUTH ONCE DAILY 90 capsule 1  . predniSONE (DELTASONE) 10 MG tablet 6 tablets on Day 1 , then reduce by 1 tablet daily until gone 21 tablet 0  . rosuvastatin (CRESTOR) 20 MG tablet TAKE 1 TABLET BY MOUTH ONCE DAILY 30 tablet 3  . valACYclovir (VALTREX) 500 MG tablet Take 1 tablet (500 mg total) by mouth 3 (three) times daily. 21 tablet 2  . zolpidem (AMBIEN) 10 MG tablet TAKE 1 TABLET BY MOUTH EVERY NIGHT AT BEDTIME AS NEEDED 30 tablet 3   No facility-administered medications prior to visit.     Review of Systems;  Patient denies headache, fevers, malaise, unintentional weight loss, skin rash, eye pain, sinus congestion and sinus pain, sore throat, dysphagia,  hemoptysis , cough, dyspnea, wheezing, chest pain, palpitations, orthopnea, edema, abdominal pain, nausea, melena, diarrhea, constipation, flank pain, dysuria, hematuria, urinary  Frequency, nocturia, numbness, tingling, seizures,  Focal weakness, Loss of  consciousness,  Tremor, insomnia, depression, anxiety, and suicidal ideation.      Objective:  BP 136/82 (BP Location: Left Arm, Patient Position: Sitting, Cuff Size: Normal)   Pulse 67   Temp 97.9 F (36.6 C) (Oral)   Resp 15   Ht 5\' 1"  (1.549 m)   Wt 160 lb (72.6 kg)   SpO2 96%   BMI 30.23 kg/m   BP Readings from Last 3 Encounters:  12/17/17 136/82  09/08/17 (!) 148/88  06/05/17 138/78    Wt Readings from Last 3 Encounters:  12/17/17 160 lb (72.6 kg)  09/08/17 155 lb 12.8 oz (70.7 kg)  06/05/17 155 lb (70.3 kg)    General appearance: alert, cooperative and appears stated age Ears: normal TM's and external ear canals both  ears Throat: lips, mucosa, and tongue normal; teeth and gums normal Neck: no adenopathy, no carotid bruit, supple, symmetrical, trachea midline and thyroid not enlarged, symmetric, no tenderness/mass/nodules Back: symmetric, no curvature. ROM normal. No CVA tenderness. Lungs: clear to auscultation bilaterally Heart: regular rate and rhythm, S1, S2 normal, no murmur, click, rub or gallop Abdomen: soft, non-tender; bowel sounds normal; no masses,  no organomegaly Pulses: 2+ and symmetric Skin: Skin color, texture, turgor normal. No rashes or lesions Lymph nodes: Cervical, supraclavicular, and axillary nodes normal.  Lab Results  Component Value Date   HGBA1C 8.0 (H) 12/17/2017   HGBA1C 7.3 09/08/2017   HGBA1C 8.1 (H) 06/02/2017    Lab Results  Component Value Date   CREATININE 0.75 12/17/2017   CREATININE 0.79 09/08/2017   CREATININE 0.82 06/02/2017    Lab Results  Component Value Date   WBC 7.3 10/16/2016   HGB 13.1 10/16/2016   HCT 40.0 10/16/2016   PLT 181.0 10/16/2016   GLUCOSE 133 (H) 12/17/2017   CHOL 143 12/17/2017   TRIG 165.0 (H) 12/17/2017   HDL 43.40 12/17/2017   LDLDIRECT 71.0 06/02/2017   LDLCALC 66 12/17/2017   ALT 34 12/17/2017   AST 31 12/17/2017   NA 139 12/17/2017   K 4.1 12/17/2017   CL 102 12/17/2017   CREATININE 0.75 12/17/2017   BUN 19 12/17/2017   CO2 28 12/17/2017   TSH 1.57 10/16/2016   HGBA1C 8.0 (H) 12/17/2017   MICROALBUR 1.3 06/02/2017    No results found.  Assessment & Plan:   Problem List Items Addressed This Visit    Diabetes mellitus type 2 in obese ([place])    Loss of control on metformin alone. Will add glipizide or [place] pending evaluation of blood sugars going forward      Relevant Orders   Hemoglobin A1c (Completed)   Essential hypertension    Not well controlled on current regimen. Likely due to use of diclfonac.  Renal function stable, adding hctz 12.5 mg daily  Lab Results  Component Value Date   CREATININE 0.75  12/17/2017   Lab Results  Component Value Date   NA 139 12/17/2017   K 4.1 12/17/2017   CL 102 12/17/2017   CO2 28 12/17/2017         Relevant Medications   hydrochlorothiazide (MICROZIDE) 12.5 MG capsule   Hyperlipidemia - Primary    LDL and triglycerides are at goal on current medications. He has no side effects and liver enzymes are normal. No changes today  Lab Results  Component Value Date   CHOL 143 12/17/2017   HDL 43.40 12/17/2017   LDLCALC 66 12/17/2017   LDLDIRECT 71.0 06/02/2017   TRIG 165.0 (H) 12/17/2017  CHOLHDL 3 12/17/2017   Lab Results  Component Value Date   ALT 34 12/17/2017   AST 31 12/17/2017   ALKPHOS 75 12/17/2017   BILITOT 0.6 12/17/2017         Relevant Medications   hydrochlorothiazide (MICROZIDE) 12.5 MG capsule   Other Relevant Orders   Lipid panel (Completed)   Long-term use of high-risk medication    B12 level checked due to use of metformin.  Lab Results  Component Value Date   [id number] 299 12/17/2017         Relevant Orders   Comprehensive metabolic panel (Completed)   Sacro-iliac pain    Secondary to disk disease and synovial cyst. Prednisone taper offered., encouraged to use tylenol 2000 mg daily.        Other Visit Diagnoses    Fatigue, unspecified type       Relevant Orders   Hepatitis C antibody (Completed)   HIV antibody (Completed)   B12 (Completed)     A total of 25 minutes of face to face time was spent with patient more than half of which was spent in counselling and coordination of care   I am having Kaylenn LRonnald Ramp start on hydrochlorothiazide. I am also having her maintain her valACYclovir, omeprazole, glucose blood, predniSONE, zolpidem, gabapentin, metFORMIN, amLODipine, rosuvastatin, and diclofenac.  Meds ordered this encounter  Medications  . hydrochlorothiazide (MICROZIDE) 12.5 MG capsule    Sig: Take 1 capsule (12.5 mg total) by mouth daily.    Dispense:  90 capsule    Refill:  1     There are no discontinued medications.  Follow-up: No Follow-up on file.   Crecencio Mc, MD

## 2017-12-20 DIAGNOSIS — Z79899 Other long term (current) drug therapy: Secondary | ICD-10-CM | POA: Insufficient documentation

## 2017-12-20 NOTE — Assessment & Plan Note (Signed)
LDL and triglycerides are at goal on current medications. He has no side effects and liver enzymes are normal. No changes today  Lab Results  Component Value Date   CHOL 143 12/17/2017   HDL 43.40 12/17/2017   LDLCALC 66 12/17/2017   LDLDIRECT 71.0 06/02/2017   TRIG 165.0 (H) 12/17/2017   CHOLHDL 3 12/17/2017   Lab Results  Component Value Date   ALT 34 12/17/2017   AST 31 12/17/2017   ALKPHOS 75 12/17/2017   BILITOT 0.6 12/17/2017

## 2017-12-20 NOTE — Assessment & Plan Note (Signed)
Loss of control on metformin alone. Will add glipizide or januvia pending evaluation of blood sugars going forward

## 2017-12-20 NOTE — Assessment & Plan Note (Signed)
B12 level checked due to use of metformin.  Lab Results  Component Value Date   [id number] 146 12/17/2017

## 2017-12-20 NOTE — Assessment & Plan Note (Addendum)
Not well controlled on current regimen. Likely due to use of diclfonac.  Renal function stable, adding hctz 12.5 mg daily  Lab Results  Component Value Date   CREATININE 0.75 12/17/2017   Lab Results  Component Value Date   NA 139 12/17/2017   K 4.1 12/17/2017   CL 102 12/17/2017   CO2 28 12/17/2017

## 2017-12-20 NOTE — Assessment & Plan Note (Signed)
Secondary to disk disease and synovial cyst. Prednisone taper offered., encouraged to use tylenol 2000 mg daily.

## 2018-03-11 ENCOUNTER — Other Ambulatory Visit: Payer: Self-pay | Admitting: Internal Medicine

## 2018-03-25 ENCOUNTER — Other Ambulatory Visit: Payer: Self-pay | Admitting: Internal Medicine

## 2018-04-07 ENCOUNTER — Other Ambulatory Visit: Payer: Self-pay | Admitting: Internal Medicine

## 2018-04-08 ENCOUNTER — Other Ambulatory Visit: Payer: Self-pay | Admitting: Internal Medicine

## 2018-04-08 NOTE — Telephone Encounter (Signed)
Printed, signed and faxed.  

## 2018-04-08 NOTE — Telephone Encounter (Signed)
Refilled: 08/12/2017 Last OV: 12/17/2017 Next OV: not scheduled

## 2018-04-27 ENCOUNTER — Other Ambulatory Visit: Payer: Self-pay | Admitting: Internal Medicine

## 2018-05-11 ENCOUNTER — Telehealth: Payer: Self-pay | Admitting: Internal Medicine

## 2018-05-11 ENCOUNTER — Other Ambulatory Visit: Payer: Self-pay | Admitting: Internal Medicine

## 2018-05-11 ENCOUNTER — Encounter: Payer: Self-pay | Admitting: Internal Medicine

## 2018-05-11 NOTE — Telephone Encounter (Signed)
Called and lm on vm to call office and set a 5m follow up appt.

## 2018-07-10 ENCOUNTER — Other Ambulatory Visit: Payer: Self-pay | Admitting: Internal Medicine

## 2018-08-24 ENCOUNTER — Telehealth: Payer: Self-pay

## 2018-08-24 NOTE — Telephone Encounter (Signed)
Copied from [address] [id number]. Topic: Medicare AWV >> Aug 24, 2018 12:26 PM Mylinda Latina, NT wrote: Reason for CRM: Patient called and states she needs a Medicare AWV. Patient states she just got a her new insurance card . Please call to schedule CB# [phone]

## 2018-08-25 NOTE — Telephone Encounter (Signed)
Pt was called, phone just rang no answer.

## 2018-09-11 ENCOUNTER — Other Ambulatory Visit: Payer: Self-pay | Admitting: Internal Medicine

## 2018-09-28 ENCOUNTER — Ambulatory Visit (INDEPENDENT_AMBULATORY_CARE_PROVIDER_SITE_OTHER): Payer: Medicare Other | Admitting: Internal Medicine

## 2018-09-28 ENCOUNTER — Encounter: Payer: Self-pay | Admitting: Internal Medicine

## 2018-09-28 VITALS — BP 106/70 | HR 67 | Temp 98.2°F | Resp 15 | Ht 61.0 in | Wt 153.0 lb

## 2018-09-28 DIAGNOSIS — R7401 Elevation of levels of liver transaminase levels: Secondary | ICD-10-CM

## 2018-09-28 DIAGNOSIS — R740 Nonspecific elevation of levels of transaminase and lactic acid dehydrogenase [LDH]: Secondary | ICD-10-CM

## 2018-09-28 DIAGNOSIS — E785 Hyperlipidemia, unspecified: Secondary | ICD-10-CM | POA: Diagnosis not present

## 2018-09-28 DIAGNOSIS — I10 Essential (primary) hypertension: Secondary | ICD-10-CM | POA: Diagnosis not present

## 2018-09-28 DIAGNOSIS — Z23 Encounter for immunization: Secondary | ICD-10-CM

## 2018-09-28 DIAGNOSIS — R0609 Other forms of dyspnea: Secondary | ICD-10-CM | POA: Diagnosis not present

## 2018-09-28 DIAGNOSIS — E1169 Type 2 diabetes mellitus with other specified complication: Secondary | ICD-10-CM | POA: Diagnosis not present

## 2018-09-28 DIAGNOSIS — E669 Obesity, unspecified: Secondary | ICD-10-CM

## 2018-09-28 DIAGNOSIS — R0600 Dyspnea, unspecified: Secondary | ICD-10-CM

## 2018-09-28 DIAGNOSIS — D492 Neoplasm of unspecified behavior of bone, soft tissue, and skin: Secondary | ICD-10-CM

## 2018-09-28 DIAGNOSIS — M5126 Other intervertebral disc displacement, lumbar region: Secondary | ICD-10-CM

## 2018-09-28 LAB — POCT GLYCOSYLATED HEMOGLOBIN (HGB A1C): Hemoglobin A1C: 7.3 % — AB (ref 4.0–5.6)

## 2018-09-28 MED ORDER — TRAMADOL HCL 50 MG PO TABS: 50.0000 mg | ORAL_TABLET | Freq: Four times a day (QID) | ORAL | 0 refills | Status: DC | PRN

## 2018-09-28 NOTE — Progress Notes (Signed)
Subjective:  Patient ID: Sara Mills, female    DOB: 22-Feb-1951  Age: 67 y.o. MRN: [id number]  CC: The primary encounter diagnosis was Diabetes mellitus type 2 in obese ([place]). Diagnoses of Skin neoplasm, Encounter for immunization, Hyperlipidemia due to type 2 diabetes mellitus ([place]), Exertional dyspnea, Essential hypertension, and Low back pain due to displacement of intervertebral disc were also pertinent to this visit.  HPI Sara Mills presents for 6 month follow up on diabetes.  Patient has no complaints today.  Patient is following a low glycemic index diet and taking all prescribed medications regularly without side effects.  Average weekly blood g sugars have been  less than 155 . Patient is not exercising or  intentionally trying to lose weight  Due to the birth of a  new granddaughter  Two weeks ago .  Patient has had an eye exam in the last 12 months and checks feet regularly for signs of infection.  Patient does not walk barefoot outside,  And denies an numbness tingling or burning in feet. Patient is up to date on all recommended vaccinations.  Takes gabapentin for back pain with sciatica  Prescribed by ortho.    Chronic back pain:  Reportedly had an r MRI lumbar spine 2 ruptured  Disks and synovial cyst  Managed by  [facility]  Has been in pain for months.  Yesterday used heating pad and tylenol 650 mg.  Left hip pain is severe,  Aggravated by  lying down on it . 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    Lab Results  Component Value Date   HGBA1C 7.3 (A) 09/28/2018       Obesity: she reports that she was exercising regularly until one month ago  Reached a  nadir 145 lbs.   Then family stressors  Became obstacles..       Outpatient Medications Prior to Visit  Medication Sig Dispense Refill  . amLODipine (NORVASC) 5 MG tablet TAKE 1 TABLET BY MOUTH ONCE DAILY 90 tablet 1  . diclofenac (VOLTAREN) 75 MG EC tablet TAKE 1 TABLET BY  MOUTH TWICE (2) DAILY 60 tablet 2  . gabapentin (NEURONTIN) 300 MG capsule Take 300 mg by mouth 3 (three) times daily.   0  . glucose blood test strip Relion meter. Use daily to check blood sugars 100 each 3  . hydrochlorothiazide (MICROZIDE) 12.5 MG capsule TAKE 1 CAPSULE BY MOUTH ONCE DAILY 90 capsule 1  . metFORMIN (GLUCOPHAGE) 850 MG tablet TAKE 1 TABLET BY MOUTH TWICE A DAY WITH A MEAL 180 tablet 1  . omeprazole (PRILOSEC) 40 MG capsule TAKE 1 CAPSULE BY MOUTH ONCE DAILY 90 capsule 1  . rosuvastatin (CRESTOR) 20 MG tablet TAKE 1 TABLET BY MOUTH ONCE A DAY 90 tablet 1  . valACYclovir (VALTREX) 500 MG tablet Take 1 tablet (500 mg total) by mouth 3 (three) times daily. 21 tablet 2  . zolpidem (AMBIEN) 10 MG tablet TAKE 1 TABLET BY MOUTH EVERY NIGHT AT BEDTIME AS NEEDED 30 tablet 5  . predniSONE (DELTASONE) 10 MG tablet 6 tablets on Day 1 , then reduce by 1 tablet daily until gone (Patient not taking: Reported on 09/28/2018) 21 tablet 0   No facility-administered medications prior to visit.     Review of Systems;  Patient denies headache, fevers, malaise, unintentional weight loss, skin rash, eye pain, sinus congestion and sinus pain, sore throat, dysphagia,  hemoptysis , cough, dyspnea, wheezing,  chest pain, palpitations, orthopnea, edema, abdominal pain, nausea, melena, diarrhea, constipation, flank pain, dysuria, hematuria, urinary  Frequency, nocturia, numbness, tingling, seizures,  Focal weakness, Loss of consciousness,  Tremor, insomnia, depression, anxiety, and suicidal ideation.      Objective:  BP 106/70 (BP Location: Left Arm, Patient Position: Sitting, Cuff Size: Normal)   Pulse 67   Temp 98.2 F (36.8 C) (Oral)   Resp 15   Ht 5\' 1"  (1.549 m)   Wt 153 lb (69.4 kg)   SpO2 97%   BMI 28.91 kg/m   BP Readings from Last 3 Encounters:  09/28/18 106/70  12/17/17 136/82  09/08/17 (!) 148/88    Wt Readings from Last 3 Encounters:  09/28/18 153 lb (69.4 kg)  12/17/17 160 lb  (72.6 kg)  09/08/17 155 lb 12.8 oz (70.7 kg)    General appearance: alert, cooperative and appears stated age Ears: normal TM's and external ear canals both ears Throat: lips, mucosa, and tongue normal; teeth and gums normal Neck: no adenopathy, no carotid bruit, supple, symmetrical, trachea midline and thyroid not enlarged, symmetric, no tenderness/mass/nodules Back: symmetric, no curvature. ROM normal. No CVA tenderness. Lungs: clear to auscultation bilaterally Heart: regular rate and rhythm, S1, S2 normal, no murmur, click, rub or gallop Abdomen: soft, non-tender; bowel sounds normal; no masses,  no organomegaly Pulses: 2+ and symmetric Skin: Skin color, texture, turgor normal. No rashes or lesions Lymph nodes: Cervical, supraclavicular, and axillary nodes normal.  Lab Results  Component Value Date   HGBA1C 7.3 (A) 09/28/2018   HGBA1C 8.0 (H) 12/17/2017   HGBA1C 7.3 09/08/2017    Lab Results  Component Value Date   CREATININE 0.75 12/17/2017   CREATININE 0.79 09/08/2017   CREATININE 0.82 06/02/2017    Lab Results  Component Value Date   WBC 7.3 10/16/2016   HGB 13.1 10/16/2016   HCT 40.0 10/16/2016   PLT 181.0 10/16/2016   GLUCOSE 133 (H) 12/17/2017   CHOL 143 12/17/2017   TRIG 165.0 (H) 12/17/2017   HDL 43.40 12/17/2017   LDLDIRECT 71.0 06/02/2017   LDLCALC 66 12/17/2017   ALT 34 12/17/2017   AST 31 12/17/2017   NA 139 12/17/2017   K 4.1 12/17/2017   CL 102 12/17/2017   CREATININE 0.75 12/17/2017   BUN 19 12/17/2017   CO2 28 12/17/2017   TSH 1.57 10/16/2016   HGBA1C 7.3 (A) 09/28/2018   MICROALBUR 1.3 06/02/2017    No results found.  Assessment & Plan:   Problem List Items Addressed This Visit    Diabetes mellitus type 2 in obese ([place]) - Primary     diabetes control is improving but not yet at goal.  a1c has improved to 7.3  Other labs are fine.   Encouraged to  Continue current diet and medications and resume regular exercise. She is overdue for  urinary microalbumin.  Follow up 6 months  Lab Results  Component Value Date   HGBA1C 7.3 (A) 09/28/2018   Lab Results  Component Value Date   CREATININE 0.75 12/17/2017   Lab Results  Component Value Date   MICROALBUR 1.3 06/02/2017         Relevant Orders   Comprehensive metabolic panel   POCT HgB A1C (Completed)   Microalbumin / creatinine urine ratio   Essential hypertension    Well controlled on current regimen. Renal function is due, no changes today.      Exertional dyspnea    She underwent noninvasive cardiac testing for ischemia in 2015.  myoview was normal.  Symptoms improved with weight loss and conditioninig.       Hyperlipidemia due to type 2 diabetes mellitus ([facility])    LDL and triglycerides are at goal on current medications. She  has no side effects and liver enzymes are due. No changes today  Lab Results  Component Value Date   CHOL 143 12/17/2017   HDL 43.40 12/17/2017   LDLCALC 66 12/17/2017   LDLDIRECT 71.0 06/02/2017   TRIG 165.0 (H) 12/17/2017   CHOLHDL 3 12/17/2017   Lab Results  Component Value Date   ALT 34 12/17/2017   AST 31 12/17/2017   ALKPHOS 75 12/17/2017   BILITOT 0.6 12/17/2017         Low back pain due to displacement of intervertebral disc    Daytime regimen for pain control addressed:  Diclofenac bid,  Tylenol 2000 mg daily  Prn tramadol      Relevant Medications   traMADol (ULTRAM) 50 MG tablet    Other Visit Diagnoses    Skin neoplasm       Relevant Orders   Ambulatory referral to Dermatology   Encounter for immunization       Relevant Orders   Flu vaccine HIGH DOSE PF (Completed)      I have discontinued Maciah L. Hettich's predniSONE. I am also having her start on traMADol. Additionally, I am having her maintain her valACYclovir, omeprazole, glucose blood, gabapentin, hydrochlorothiazide, zolpidem, amLODipine, diclofenac, metFORMIN, and rosuvastatin.  Meds ordered this encounter  Medications  . traMADol  (ULTRAM) 50 MG tablet    Sig: Take 1 tablet (50 mg total) by mouth every 6 (six) hours as needed.    Dispense:  30 tablet    Refill:  0    Medications Discontinued During This Encounter  Medication Reason  . predniSONE (DELTASONE) 10 MG tablet Completed Course    Follow-up: Return in about 3 months (around 12/29/2018).   Crecencio Mc, MD

## 2018-09-28 NOTE — Patient Instructions (Addendum)
  Try the SOLA low carb bread ([employer]  Frozen bread )  3 g/slice  Tastes great!   For your back and neck pain:  DICLOFENAC every 12 HOURS,  DO NOT COMBINE WITH ADVIL    You can add up to 2000 mg of acetominophen (tylenol) every day safely  In divided doses (500 mg every 6 hours  Or 1000 mg every 12 hours.)  USE THE TRAMADOL SPARINGLY ,  CAN COMBINE  WITH THE OTHER MEDS    Referral to   [facility] dermatology

## 2018-09-29 DIAGNOSIS — M5126 Other intervertebral disc displacement, lumbar region: Secondary | ICD-10-CM | POA: Insufficient documentation

## 2018-09-29 LAB — COMPREHENSIVE METABOLIC PANEL
ALT: 41 U/L — ABNORMAL HIGH (ref 0–35)
AST: 37 U/L (ref 0–37)
Albumin: 4.8 g/dL (ref 3.5–5.2)
Alkaline Phosphatase: 71 U/L (ref 39–117)
BUN: 23 mg/dL (ref 6–23)
CO2: 31 mEq/L (ref 19–32)
Calcium: 10 mg/dL (ref 8.4–10.5)
Chloride: 99 mEq/L (ref 96–112)
Creatinine, Ser: 0.98 mg/dL (ref 0.40–1.20)
GFR: 60.5 mL/min (ref >60.00)
Glucose, Bld: 125 mg/dL — ABNORMAL HIGH (ref 70–99)
Potassium: 3.8 mEq/L (ref 3.5–5.1)
Sodium: 138 mEq/L (ref 135–145)
Total Bilirubin: 0.5 mg/dL (ref 0.2–1.2)
Total Protein: 7.7 g/dL (ref 6.0–8.3)

## 2018-09-29 NOTE — Assessment & Plan Note (Signed)
Daytime regimen for pain control addressed:  Diclofenac bid,  Tylenol 2000 mg daily  Prn tramadol

## 2018-09-29 NOTE — Assessment & Plan Note (Signed)
diabetes control is improving but not yet at goal.  a1c has improved to 7.3  Other labs are fine.   Encouraged to  Continue current diet and medications and resume regular exercise. She is overdue for urinary microalbumin.  Follow up 6 months  Lab Results  Component Value Date   HGBA1C 7.3 (A) 09/28/2018   Lab Results  Component Value Date   CREATININE 0.75 12/17/2017   Lab Results  Component Value Date   MICROALBUR 1.3 06/02/2017

## 2018-09-29 NOTE — Assessment & Plan Note (Signed)
She underwent noninvasive cardiac testing for ischemia in 2015.  myoview was normal.  Symptoms improved with weight loss and conditioninig.

## 2018-09-29 NOTE — Addendum Note (Signed)
Addended by: Crecencio Mc on: 09/29/2018 09:41 PM   Modules accepted: Orders

## 2018-09-29 NOTE — Assessment & Plan Note (Signed)
Well controlled on current regimen. Renal function is due, no changes today. °

## 2018-09-29 NOTE — Assessment & Plan Note (Signed)
LDL and triglycerides are at goal on current medications. She  has no side effects and liver enzymes are due. No changes today  Lab Results  Component Value Date   CHOL 143 12/17/2017   HDL 43.40 12/17/2017   LDLCALC 66 12/17/2017   LDLDIRECT 71.0 06/02/2017   TRIG 165.0 (H) 12/17/2017   CHOLHDL 3 12/17/2017   Lab Results  Component Value Date   ALT 34 12/17/2017   AST 31 12/17/2017   ALKPHOS 75 12/17/2017   BILITOT 0.6 12/17/2017

## 2018-10-12 ENCOUNTER — Other Ambulatory Visit: Payer: Self-pay | Admitting: Internal Medicine

## 2018-10-21 MED ORDER — GLUCOSE BLOOD VI STRP: ORAL_STRIP | 12 refills | Status: DC

## 2018-10-27 ENCOUNTER — Other Ambulatory Visit: Payer: Self-pay | Admitting: Internal Medicine

## 2018-11-03 ENCOUNTER — Other Ambulatory Visit: Payer: Self-pay

## 2018-11-13 ENCOUNTER — Other Ambulatory Visit: Payer: Self-pay | Admitting: Internal Medicine

## 2018-11-16 NOTE — Telephone Encounter (Signed)
Tramadol  Refilled: 09/28/2018 Last OV: 09/28/2018 Next OV: 11/17/2018

## 2018-11-17 ENCOUNTER — Ambulatory Visit (INDEPENDENT_AMBULATORY_CARE_PROVIDER_SITE_OTHER): Payer: Medicare Other

## 2018-11-17 ENCOUNTER — Ambulatory Visit (INDEPENDENT_AMBULATORY_CARE_PROVIDER_SITE_OTHER): Payer: Medicare Other | Admitting: Internal Medicine

## 2018-11-17 ENCOUNTER — Encounter: Payer: Self-pay | Admitting: Internal Medicine

## 2018-11-17 VITALS — BP 110/72 | HR 71 | Temp 98.0°F | Resp 15 | Ht 61.0 in | Wt 154.8 lb

## 2018-11-17 DIAGNOSIS — E1169 Type 2 diabetes mellitus with other specified complication: Secondary | ICD-10-CM

## 2018-11-17 DIAGNOSIS — Z0000 Encounter for general adult medical examination without abnormal findings: Secondary | ICD-10-CM | POA: Diagnosis not present

## 2018-11-17 DIAGNOSIS — M4802 Spinal stenosis, cervical region: Secondary | ICD-10-CM | POA: Diagnosis not present

## 2018-11-17 DIAGNOSIS — E785 Hyperlipidemia, unspecified: Secondary | ICD-10-CM | POA: Diagnosis not present

## 2018-11-17 DIAGNOSIS — M5126 Other intervertebral disc displacement, lumbar region: Secondary | ICD-10-CM | POA: Diagnosis not present

## 2018-11-17 DIAGNOSIS — E669 Obesity, unspecified: Secondary | ICD-10-CM

## 2018-11-17 DIAGNOSIS — Z1239 Encounter for other screening for malignant neoplasm of breast: Secondary | ICD-10-CM

## 2018-11-17 DIAGNOSIS — M5412 Radiculopathy, cervical region: Secondary | ICD-10-CM

## 2018-11-17 LAB — MICROALBUMIN / CREATININE URINE RATIO
Creatinine,U: 218.8 mg/dL
Microalb Creat Ratio: 1 mg/g (ref 0.0–30.0)
Microalb, Ur: 2.3 mg/dL — ABNORMAL HIGH (ref 0.0–1.9)

## 2018-11-17 MED ORDER — GLUCOSE BLOOD VI STRP: ORAL_STRIP | 12 refills | Status: DC

## 2018-11-17 MED ORDER — ZOSTER VAC RECOMB ADJUVANTED 50 MCG/0.5ML IM SUSR: 0.5000 mL | Freq: Once | INTRAMUSCULAR | 1 refills | Status: AC

## 2018-11-17 MED ORDER — METFORMIN HCL 1000 MG PO TABS: 1000.0000 mg | ORAL_TABLET | Freq: Two times a day (BID) | ORAL | 5 refills | Status: DC

## 2018-11-17 NOTE — Patient Instructions (Addendum)
Use the prednisone taper and tramadol if you have an exacerbation of the low back or neck pain over the holidays  I have increased your metformin dose to 1000 mg twice daily     if your Blood Sugars  2 hours after  after dinner continue  to be > 160  We will  need to add glipizide 2.5 mg with dinner   Your annual mammogram has been ordered.  You are encouraged (required) to call to make your appointment at [facility]   I am ordering x rays of your neck (cerivcal spine ) today.  Consider buying a pillow with neck support for use on long trips,  Lying on the couch,  Etc.    You are due for labs on or  After January 22    Health Maintenance for Postmenopausal Women Menopause is a normal process in which your reproductive ability comes to an end. This process happens gradually over a span of months to years, usually between the ages of 69 and 26. Menopause is complete when you have missed 12 consecutive menstrual periods. It is important to talk with your health care provider about some of the most common conditions that affect postmenopausal women, such as heart disease, cancer, and bone loss (osteoporosis). Adopting a healthy lifestyle and getting preventive care can help to promote your health and wellness. Those actions can also lower your chances of developing some of these common conditions. What should I know about menopause? During menopause, you may experience a number of symptoms, such as:  Moderate-to-severe hot flashes.  Night sweats.  Decrease in sex drive.  Mood swings.  Headaches.  Tiredness.  Irritability.  Memory problems.  Insomnia.  Choosing to treat or not to treat menopausal changes is an individual decision that you make with your health care provider. What should I know about hormone replacement therapy and supplements? Hormone therapy products are effective for treating symptoms that are associated with menopause, such as hot flashes and night  sweats. Hormone replacement carries certain risks, especially as you become older. If you are thinking about using estrogen or estrogen with progestin treatments, discuss the benefits and risks with your health care provider. What should I know about heart disease and stroke? Heart disease, heart attack, and stroke become more likely as you age. This may be due, in part, to the hormonal changes that your body experiences during menopause. These can affect how your body processes dietary fats, triglycerides, and cholesterol. Heart attack and stroke are both medical emergencies. There are many things that you can do to help prevent heart disease and stroke:  Have your blood pressure checked at least every 1-2 years. High blood pressure causes heart disease and increases the risk of stroke.  If you are 40-24 years old, ask your health care provider if you should take aspirin to prevent a heart attack or a stroke.  Do not use any tobacco products, including cigarettes, chewing tobacco, or electronic cigarettes. If you need help quitting, ask your health care provider.  It is important to eat a healthy diet and maintain a healthy weight. ? Be sure to include plenty of vegetables, fruits, low-fat dairy products, and lean protein. ? Avoid eating foods that are high in solid fats, added sugars, or salt (sodium).  Get regular exercise. This is one of the most important things that you can do for your health. ? Try to exercise for at least 150 minutes each week. The type of exercise  that you do should increase your heart rate and make you sweat. This is known as moderate-intensity exercise. ? Try to do strengthening exercises at least twice each week. Do these in addition to the moderate-intensity exercise.  Know your numbers.Ask your health care provider to check your cholesterol and your blood glucose. Continue to have your blood tested as directed by your health care provider.  What should I know  about cancer screening? There are several types of cancer. Take the following steps to reduce your risk and to catch any cancer development as early as possible. Breast Cancer  Practice breast self-awareness. ? This means understanding how your breasts normally appear and feel. ? It also means doing regular breast self-exams. Let your health care provider know about any changes, no matter how small.  If you are 49 or older, have a clinician do a breast exam (clinical breast exam or CBE) every year. Depending on your age, family history, and medical history, it may be recommended that you also have a yearly breast X-ray (mammogram).  If you have a family history of breast cancer, talk with your health care provider about genetic screening.  If you are at high risk for breast cancer, talk with your health care provider about having an MRI and a mammogram every year.  Breast cancer (BRCA) gene test is recommended for women who have family members with BRCA-related cancers. Results of the assessment will determine the need for genetic counseling and BRCA1 and for BRCA2 testing. BRCA-related cancers include these types: ? Breast. This occurs in males or females. ? Ovarian. ? Tubal. This may also be called fallopian tube cancer. ? Cancer of the abdominal or pelvic lining (peritoneal cancer). ? Prostate. ? Pancreatic.  Cervical, Uterine, and Ovarian Cancer Your health care provider may recommend that you be screened regularly for cancer of the pelvic organs. These include your ovaries, uterus, and vagina. This screening involves a pelvic exam, which includes checking for microscopic changes to the surface of your cervix (Pap test).  For women ages 21-65, health care providers may recommend a pelvic exam and a Pap test every three years. For women ages 46-65, they may recommend the Pap test and pelvic exam, combined with testing for human papilloma virus (HPV), every five years. Some types of HPV  increase your risk of cervical cancer. Testing for HPV may also be done on women of any age who have unclear Pap test results.  Other health care providers may not recommend any screening for nonpregnant women who are considered low risk for pelvic cancer and have no symptoms. Ask your health care provider if a screening pelvic exam is right for you.  If you have had past treatment for cervical cancer or a condition that could lead to cancer, you need Pap tests and screening for cancer for at least 20 years after your treatment. If Pap tests have been discontinued for you, your risk factors (such as having a new sexual partner) need to be reassessed to determine if you should start having screenings again. Some women have medical problems that increase the chance of getting cervical cancer. In these cases, your health care provider may recommend that you have screening and Pap tests more often.  If you have a family history of uterine cancer or ovarian cancer, talk with your health care provider about genetic screening.  If you have vaginal bleeding after reaching menopause, tell your health care provider.  There are currently no reliable tests available to  screen for ovarian cancer.  Lung Cancer Lung cancer screening is recommended for adults 52-32 years old who are at high risk for lung cancer because of a history of smoking. A yearly low-dose CT scan of the lungs is recommended if you:  Currently smoke.  Have a history of at least 30 pack-years of smoking and you currently smoke or have quit within the past 15 years. A pack-year is smoking an average of one pack of cigarettes per day for one year.  Yearly screening should:  Continue until it has been 15 years since you quit.  Stop if you develop a health problem that would prevent you from having lung cancer treatment.  Colorectal Cancer  This type of cancer can be detected and can often be prevented.  Routine colorectal cancer  screening usually begins at age 35 and continues through age 74.  If you have risk factors for colon cancer, your health care provider may recommend that you be screened at an earlier age.  If you have a family history of colorectal cancer, talk with your health care provider about genetic screening.  Your health care provider may also recommend using home test kits to check for hidden blood in your stool.  A small camera at the end of a tube can be used to examine your colon directly (sigmoidoscopy or colonoscopy). This is done to check for the earliest forms of colorectal cancer.  Direct examination of the colon should be repeated every 5-10 years until age 53. However, if early forms of precancerous polyps or small growths are found or if you have a family history or genetic risk for colorectal cancer, you may need to be screened more often.  Skin Cancer  Check your skin from head to toe regularly.  Monitor any moles. Be sure to tell your health care provider: ? About any new moles or changes in moles, especially if there is a change in a mole's shape or color. ? If you have a mole that is larger than the size of a pencil eraser.  If any of your family members has a history of skin cancer, especially at a young age, talk with your health care provider about genetic screening.  Always use sunscreen. Apply sunscreen liberally and repeatedly throughout the day.  Whenever you are outside, protect yourself by wearing long sleeves, pants, a wide-brimmed hat, and sunglasses.  What should I know about osteoporosis? Osteoporosis is a condition in which bone destruction happens more quickly than new bone creation. After menopause, you may be at an increased risk for osteoporosis. To help prevent osteoporosis or the bone fractures that can happen because of osteoporosis, the following is recommended:  If you are 5-58 years old, get at least 1,000 mg of calcium and at least 600 mg of vitamin D  per day.  If you are older than age 80 but younger than age 46, get at least 1,200 mg of calcium and at least 600 mg of vitamin D per day.  If you are older than age 64, get at least 1,200 mg of calcium and at least 800 mg of vitamin D per day.  Smoking and excessive alcohol intake increase the risk of osteoporosis. Eat foods that are rich in calcium and vitamin D, and do weight-bearing exercises several times each week as directed by your health care provider. What should I know about how menopause affects my mental health? Depression may occur at any age, but it is more common as you  become older. Common symptoms of depression include:  Low or sad mood.  Changes in sleep patterns.  Changes in appetite or eating patterns.  Feeling an overall lack of motivation or enjoyment of activities that you previously enjoyed.  Frequent crying spells.  Talk with your health care provider if you think that you are experiencing depression. What should I know about immunizations? It is important that you get and maintain your immunizations. These include:  Tetanus, diphtheria, and pertussis (Tdap) booster vaccine.  Influenza every year before the flu season begins.  Pneumonia vaccine.  Shingles vaccine.  Your health care provider may also recommend other immunizations. This information is not intended to replace advice given to you by your health care provider. Make sure you discuss any questions you have with your health care provider. Document Released: 01/17/2006 Document Revised: 06/14/2016 Document Reviewed: 08/29/2015 Elsevier Interactive Patient Education  2018 [employer].

## 2018-11-17 NOTE — Progress Notes (Addendum)
Patient ID: Sara Mills, female    DOB: [birth year]  Age: 67 y.o. MRN: [id number]  The patient is here for her welcome to  Medicare  examination and management of other chronic and acute problems.  Had a follow up 5 yr  colonoscopy in 2016 by Sara Mills   (per patient)   Hysterectomy  Mammogram Feb 2018      The risk factors are reflected in the social history.  The roster of all physicians providing medical care to patient - is listed in the Snapshot section of the chart.  Activities of daily living:  The patient is 100% independent in all ADLs: dressing, toileting, feeding as well as independent mobility  Home safety : The patient has smoke detectors in the home. They wear seatbelts.  There are no firearms at home. There is no violence in the home.   There is no risks for hepatitis, STDs or HIV. There is no   history of blood transfusion. They have no travel history to infectious disease endemic areas of the world.  The patient has seen their dentist in the last six month. They have seen their eye doctor in the last year. They admit to slight hearing difficulty with regard to whispered voices and some television programs.  They have deferred audiologic testing in the last year.  They do not  have excessive sun exposure. Discussed the need for sun protection: hats, long sleeves and use of sunscreen if there is significant sun exposure.   Diet: the importance of a healthy diet is discussed. They do have a healthy diet.  The benefits of regular aerobic exercise were discussed. She walks 4 times per week ,  20 minutes.   Depression screen: there are no signs or vegative symptoms of depression- irritability, change in appetite, anhedonia, sadness/tearfullness.  Cognitive assessment: the patient manages all their financial and personal affairs and is actively engaged. They could relate day,date,year and events; recalled 2/3 objects at 3 minutes; performed clock-face test normally.  During  the course of the visit , End of Life objectives were discussed at length,  Patient does not have a living will in place or a healthcare power of attorney.  She was given printed information about advance directives and encouraged to return after discussing with her family,    The following portions of the patient's history were reviewed and updated as appropriate: allergies, current medications, past family history, past medical history,  past surgical history, past social history  and problem list.  Visual acuity was not assessed per patient preference since she has regular follow up with her ophthalmologist. Hearing and body mass index were assessed and reviewed.   During the course of the visit the patient was educated and counseled about appropriate screening and preventive services including : fall prevention , diabetes screening, nutrition counseling, colorectal cancer screening, and recommended immunizations.    CC: The primary encounter diagnosis was Encounter for preventive health examination. Diagnoses of Cervical radiculopathy, Diabetes mellitus type 2 in obese ([place]), Hyperlipidemia due to type 2 diabetes mellitus ([place]), Breast cancer screening, and Low back pain due to displacement of intervertebral disc were also pertinent to this visit.   1) back pain: has not seen  [facility] since last Spring .  Has had MRI  Which noted a cyst pressing on a nerve , tried a cyst aspiration and ESI  which did help at all  Has scoliosis . Not taking tramadol daily but taking gabapentin twice daily  And diclofenac twice daily.  Uses ambien chronically. Pain is aggravated by sitting on the couch, vacuuming,  And sweeping.  Needs to see Ortho .  Has a second pain in the  Right occipital region that  radiates down neck and down right shoulder .   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    2) decreased attention span . Retired early from  work due to trouble remembering details.   Feels scatterbrained,  Not finishing tasks.  Denies loss of memory   3) type 2 DM   Post prandials have ben elevated    Lab Results  Component Value Date   HGBA1C 7.3 (A) 09/28/2018     History Sara Mills has a past medical history of Diabetes mellitus, GERD (gastroesophageal reflux disease), Hyperlipidemia, and Hypertension.   She has a past surgical history that includes Bilateral oophorectomy; Cesarean section (1977 and 1978); Carpal tunnel release (Right); Abdominal hysterectomy (1995 ); Appendectomy; and Bunionectomy.   Her family history includes Cancer (age of onset: 45) in her father; Heart murmur in her mother; Mitral valve prolapse in her mother; Stomach cancer in her father; Stroke in her mother.She reports that she has never smoked. She has never used smokeless tobacco. She reports that she does not drink alcohol or use drugs.  Outpatient Medications Prior to Visit  Medication Sig Dispense Refill  . amLODipine (NORVASC) 5 MG tablet TAKE 1 TABLET BY MOUTH ONCE A DAY 90 tablet 1  . diclofenac (VOLTAREN) 75 MG EC tablet TAKE 1 TABLET BY MOUTH TWICE A DAY 60 tablet 2  . gabapentin (NEURONTIN) 300 MG capsule Take 300 mg by mouth 3 (three) times daily.   0  . glucose blood test strip Use once daily to check BS for type 2 DM management 100 each 12  . hydrochlorothiazide (MICROZIDE) 12.5 MG capsule TAKE 1 CAPSULE BY MOUTH ONCE DAILY 90 capsule 1  . omeprazole (PRILOSEC) 40 MG capsule TAKE 1 CAPSULE BY MOUTH ONCE DAILY 90 capsule 1  . rosuvastatin (CRESTOR) 20 MG tablet TAKE 1 TABLET BY MOUTH ONCE A DAY 90 tablet 1  . traMADol (ULTRAM) 50 MG tablet TAKE 1 TABLET BY MOUTH EVERY 6 HOURS AS NEEDED 30 tablet 0  . valACYclovir (VALTREX) 500 MG tablet Take 1 tablet (500 mg total) by mouth 3 (three) times daily. 21 tablet 2  . zolpidem (AMBIEN) 10 MG tablet TAKE 1 TABLET BY MOUTH AT BEDTIME AS NEEDED 30 tablet 5  . metFORMIN (GLUCOPHAGE) 850 MG tablet  TAKE 1 TABLET BY MOUTH TWICE A DAY WITH A MEAL 180 tablet 1  . omeprazole (PRILOSEC) 40 MG capsule TAKE 1 CAPSULE BY MOUTH ONCE DAILY (Patient not taking: Reported on 11/17/2018) 90 capsule 0   No facility-administered medications prior to visit.     Review of Systems   Patient denies headache, fevers, malaise, unintentional weight loss, skin rash, eye pain, sinus congestion and sinus pain, sore throat, dysphagia,  hemoptysis , cough, dyspnea, wheezing, chest pain, palpitations, orthopnea, edema, abdominal pain, nausea, melena, diarrhea, constipation, flank pain, dysuria, hematuria, urinary  Frequency, nocturia, numbness, tingling, seizures,  Focal weakness, Loss of consciousness,  Tremor, insomnia, depression, anxiety, and suicidal ideation.      Objective:  BP 110/72 (BP Location: Left Arm, Patient Position: Sitting, Cuff Size: Normal)   Pulse 71   Temp 98 F (36.7 C) (Oral)   Resp 15   Ht 5\' 1"  (1.549 m)   Wt  154 lb 12.8 oz (70.2 kg)   SpO2 97%   BMI 29.25 kg/m   Physical Exam   General appearance: alert, cooperative and appears stated age Head: Normocephalic, without obvious abnormality, atraumatic Eyes: conjunctivae/corneas clear. PERRL, EOM's intact. Fundi benign. Ears: normal TM's and external ear canals both ears Nose: Nares normal. Septum midline. Mucosa normal. No drainage or sinus tenderness. Throat: lips, mucosa, and tongue normal; teeth and gums normal Neck: no adenopathy, no carotid bruit, no JVD, supple, symmetrical, trachea midline and thyroid not enlarged, symmetric, no tenderness/mass/nodules Lungs: clear to auscultation bilaterally Breasts: normal appearance, no masses or tenderness Heart: regular rate and rhythm, S1, S2 normal, no murmur, click, rub or gallop Abdomen: soft, non-tender; bowel sounds normal; no masses,  no organomegaly Extremities: extremities normal, atraumatic, no cyanosis or edema Pulses: 2+ and symmetric Skin: Skin color, texture, turgor  normal. No rashes or lesions Neurologic: Alert and oriented X 3, normal strength and tone. Normal symmetric reflexes. Normal coordination and gait.      Assessment & Plan:   Problem List Items Addressed This Visit    Cervical radiculopathy    The images of the cervical spine suggested that  degenerative changes at multiple levels have caused bone spurs that may be impinging on nerve roots An MRI has been offered.      Relevant Orders   DG Cervical Spine Complete (Completed)   Diabetes mellitus type 2 in obese (HCC)    Increasing metformin to 1000 mg bid for management of postprandial hyperglycemia.  Will add glipzide if repeat a1c I > 7.0.   Lab Results  Component Value Date   HGBA1C 7.3 (A) 09/28/2018   Lab Results  Component Value Date   MICROALBUR 2.3 (H) 11/17/2018           Relevant Medications   metFORMIN (GLUCOPHAGE) 1000 MG tablet   Other Relevant Orders   Hemoglobin A1c   Comprehensive metabolic panel   Microalbumin / creatinine urine ratio (Completed)   Encounter for preventive health examination - Primary    age appropriate education and counseling updated, referrals for preventative services and immunizations addressed, dietary and smoking counseling addressed, most recent labs reviewed.  I have personally reviewed and have noted:  1) the patient's medical and social history 2) The pt's use of alcohol, tobacco, and illicit drugs 3) The patient's current medications and supplements 4) Functional ability including ADL's, fall risk, home safety risk, hearing and visual impairment 5) Diet and physical activities 6) Evidence for depression or mood disorder 7) The patient's height, weight, and BMI have been recorded in the chart  I have made referrals, and provided counseling and education based on review of the above      Hyperlipidemia due to type 2 diabetes mellitus ([employer])    She is due for fasting lipids.   Lab Results  Component Value Date   CHOL 143  12/17/2017   HDL 43.40 12/17/2017   LDLCALC 66 12/17/2017   LDLDIRECT 71.0 06/02/2017   TRIG 165.0 (H) 12/17/2017   CHOLHDL 3 12/17/2017   Lab Results  Component Value Date   ALT 41 (H) 09/28/2018   AST 37 09/28/2018   ALKPHOS 71 09/28/2018   BILITOT 0.5 09/28/2018         Relevant Medications   metFORMIN (GLUCOPHAGE) 1000 MG tablet   Other Relevant Orders   Lipid panel   Low back pain due to displacement of intervertebral disc    No improvement with ESI Prednisone taper and  tramadol refilled for prn use        Other Visit Diagnoses    Breast cancer screening       Relevant Orders   MM 3D SCREEN BREAST BILATERAL      I have changed Sara Mills's metFORMIN. I am also having her start on Zoster Vaccine Adjuvanted and glucose blood. Additionally, I am having her maintain her valACYclovir, omeprazole, gabapentin, hydrochlorothiazide, rosuvastatin, amLODipine, glucose blood, zolpidem, traMADol, and diclofenac.  Meds ordered this encounter  Medications  . Zoster Vaccine Adjuvanted [facility]) injection    Sig: Inject 0.5 mLs into the muscle once for 1 dose.    Dispense:  1 each    Refill:  1  . glucose blood test strip    Sig: For Accu Check Glucometer .  Use twice daily E11.9    Dispense:  100 each    Refill:  12  . metFORMIN (GLUCOPHAGE) 1000 MG tablet    Sig: Take 1 tablet (1,000 mg total) by mouth 2 (two) times daily with a meal.    Dispense:  30 tablet    Refill:  5    Medications Discontinued During This Encounter  Medication Reason  . omeprazole (PRILOSEC) 40 MG capsule Duplicate  . metFORMIN (GLUCOPHAGE) 850 MG tablet     Follow-up: Return in about 3 months (around 02/16/2019) for follow up diabetes.   Crecencio Mc, MD

## 2018-11-18 DIAGNOSIS — Z85828 Personal history of other malignant neoplasm of skin: Secondary | ICD-10-CM | POA: Diagnosis not present

## 2018-11-18 DIAGNOSIS — L814 Other melanin hyperpigmentation: Secondary | ICD-10-CM | POA: Diagnosis not present

## 2018-11-18 DIAGNOSIS — D1801 Hemangioma of skin and subcutaneous tissue: Secondary | ICD-10-CM | POA: Diagnosis not present

## 2018-11-18 DIAGNOSIS — L738 Other specified follicular disorders: Secondary | ICD-10-CM | POA: Diagnosis not present

## 2018-11-18 DIAGNOSIS — D485 Neoplasm of uncertain behavior of skin: Secondary | ICD-10-CM | POA: Diagnosis not present

## 2018-11-18 DIAGNOSIS — L821 Other seborrheic keratosis: Secondary | ICD-10-CM | POA: Diagnosis not present

## 2018-11-18 DIAGNOSIS — D2239 Melanocytic nevi of other parts of face: Secondary | ICD-10-CM | POA: Diagnosis not present

## 2018-11-18 DIAGNOSIS — D235 Other benign neoplasm of skin of trunk: Secondary | ICD-10-CM | POA: Diagnosis not present

## 2018-11-18 NOTE — Assessment & Plan Note (Signed)

## 2018-11-19 DIAGNOSIS — M4722 Other spondylosis with radiculopathy, cervical region: Secondary | ICD-10-CM | POA: Insufficient documentation

## 2018-11-19 DIAGNOSIS — M5412 Radiculopathy, cervical region: Secondary | ICD-10-CM | POA: Insufficient documentation

## 2018-11-19 NOTE — Assessment & Plan Note (Addendum)
Increasing metformin to 1000 mg bid for management of postprandial hyperglycemia.  Will add glipzide if repeat a1c I > 7.0.   Lab Results  Component Value Date   HGBA1C 7.3 (A) 09/28/2018   Lab Results  Component Value Date   MICROALBUR 2.3 (H) 11/17/2018

## 2018-11-19 NOTE — Assessment & Plan Note (Signed)
She is due for fasting lipids.   Lab Results  Component Value Date   CHOL 143 12/17/2017   HDL 43.40 12/17/2017   LDLCALC 66 12/17/2017   LDLDIRECT 71.0 06/02/2017   TRIG 165.0 (H) 12/17/2017   CHOLHDL 3 12/17/2017   Lab Results  Component Value Date   ALT 41 (H) 09/28/2018   AST 37 09/28/2018   ALKPHOS 71 09/28/2018   BILITOT 0.5 09/28/2018

## 2018-11-19 NOTE — Assessment & Plan Note (Signed)
The images of the cervical spine suggested that  degenerative changes at multiple levels have caused bone spurs that may be impinging on nerve roots An MRI has been offered.

## 2018-11-19 NOTE — Assessment & Plan Note (Signed)
No improvement with ESI Prednisone taper and tramadol refilled for prn use

## 2018-12-21 ENCOUNTER — Other Ambulatory Visit: Payer: Self-pay | Admitting: Internal Medicine

## 2018-12-25 DIAGNOSIS — H2513 Age-related nuclear cataract, bilateral: Secondary | ICD-10-CM | POA: Diagnosis not present

## 2018-12-25 DIAGNOSIS — H0100A Unspecified blepharitis right eye, upper and lower eyelids: Secondary | ICD-10-CM | POA: Diagnosis not present

## 2018-12-25 DIAGNOSIS — H5203 Hypermetropia, bilateral: Secondary | ICD-10-CM | POA: Diagnosis not present

## 2018-12-25 DIAGNOSIS — E119 Type 2 diabetes mellitus without complications: Secondary | ICD-10-CM | POA: Diagnosis not present

## 2018-12-25 LAB — HM DIABETES EYE EXAM

## 2018-12-28 ENCOUNTER — Encounter: Payer: Self-pay | Admitting: Internal Medicine

## 2018-12-29 ENCOUNTER — Ambulatory Visit: Payer: PRIVATE HEALTH INSURANCE | Admitting: Internal Medicine

## 2018-12-31 ENCOUNTER — Other Ambulatory Visit (INDEPENDENT_AMBULATORY_CARE_PROVIDER_SITE_OTHER): Payer: Medicare Other

## 2018-12-31 DIAGNOSIS — E785 Hyperlipidemia, unspecified: Secondary | ICD-10-CM

## 2018-12-31 DIAGNOSIS — E1169 Type 2 diabetes mellitus with other specified complication: Secondary | ICD-10-CM | POA: Diagnosis not present

## 2018-12-31 DIAGNOSIS — E669 Obesity, unspecified: Secondary | ICD-10-CM

## 2018-12-31 LAB — COMPREHENSIVE METABOLIC PANEL
ALT: 36 U/L — ABNORMAL HIGH (ref 0–35)
AST: 41 U/L — ABNORMAL HIGH (ref 0–37)
Albumin: 4.7 g/dL (ref 3.5–5.2)
Alkaline Phosphatase: 79 U/L (ref 39–117)
BUN: 20 mg/dL (ref 6–23)
CO2: 30 mEq/L (ref 19–32)
Calcium: 10 mg/dL (ref 8.4–10.5)
Chloride: 99 mEq/L (ref 96–112)
Creatinine, Ser: 0.84 mg/dL (ref 0.40–1.20)
GFR: 67.95 mL/min (ref >60.00)
Glucose, Bld: 128 mg/dL — ABNORMAL HIGH (ref 70–99)
Potassium: 3.7 mEq/L (ref 3.5–5.1)
SODIUM: 139 meq/L (ref 135–145)
Total Bilirubin: 0.6 mg/dL (ref 0.2–1.2)
Total Protein: 7.7 g/dL (ref 6.0–8.3)

## 2018-12-31 LAB — HEMOGLOBIN A1C: Hgb A1c MFr Bld: 7.8 % — ABNORMAL HIGH (ref 4.6–6.5)

## 2018-12-31 LAB — LIPID PANEL
Cholesterol: 148 mg/dL (ref 0–200)
HDL: 42.3 mg/dL (ref >39.00)
LDL Cholesterol: 71 mg/dL (ref 0–99)
NonHDL: 105.4
Total CHOL/HDL Ratio: 3
Triglycerides: 171 mg/dL — ABNORMAL HIGH (ref 0.0–149.0)
VLDL: 34.2 mg/dL (ref 0.0–40.0)

## 2019-01-01 ENCOUNTER — Other Ambulatory Visit: Payer: Self-pay | Admitting: Internal Medicine

## 2019-01-01 ENCOUNTER — Telehealth: Payer: Self-pay | Admitting: *Deleted

## 2019-01-01 DIAGNOSIS — Z79890 Hormone replacement therapy (postmenopausal): Secondary | ICD-10-CM

## 2019-01-01 DIAGNOSIS — E28319 Asymptomatic premature menopause: Secondary | ICD-10-CM

## 2019-01-01 DIAGNOSIS — Z780 Asymptomatic menopausal state: Secondary | ICD-10-CM

## 2019-01-01 NOTE — Telephone Encounter (Signed)
Spoke with pt and informed her of Dr. Lupita Dawn message. Pt stated that she will call and schedule the bone density scan.

## 2019-01-01 NOTE — Telephone Encounter (Signed)
I recommend the 3d  Mammogram for all women with dense breasts and that is what she is scheduled for.  If she wants to hve the regular she can change it,   The DEXA has been ordered

## 2019-01-01 NOTE — Telephone Encounter (Signed)
Copied from [place] [id number]. Topic: General - Inquiry >> Jan 01, 2019 11:44 AM Ahmed Prima L wrote: Reason for CRM: Patient would like to know does she need to do 3D mammo verus a regular mammo? She is scheduled on 2/4 for her mammo. Also, does she need to schedule the bone density or with Dr Derrel Nip schedule that for her? If she does not answer, please leave a message.

## 2019-01-05 ENCOUNTER — Other Ambulatory Visit: Payer: Self-pay | Admitting: Internal Medicine

## 2019-01-05 DIAGNOSIS — R748 Abnormal levels of other serum enzymes: Secondary | ICD-10-CM | POA: Insufficient documentation

## 2019-01-05 NOTE — Progress Notes (Signed)
My Chart message sent

## 2019-01-19 ENCOUNTER — Ambulatory Visit
Admission: RE | Admit: 2019-01-19 | Discharge: 2019-01-19 | Disposition: A | Discharge status: Home / Self Care | Payer: Medicare Other | Source: Ambulatory Visit | Attending: Internal Medicine | Admitting: Internal Medicine

## 2019-01-19 DIAGNOSIS — Z780 Asymptomatic menopausal state: Secondary | ICD-10-CM

## 2019-01-19 DIAGNOSIS — M8589 Other specified disorders of bone density and structure, multiple sites: Secondary | ICD-10-CM | POA: Diagnosis not present

## 2019-01-19 DIAGNOSIS — Z1231 Encounter for screening mammogram for malignant neoplasm of breast: Secondary | ICD-10-CM | POA: Diagnosis not present

## 2019-01-19 DIAGNOSIS — Z1239 Encounter for other screening for malignant neoplasm of breast: Secondary | ICD-10-CM | POA: Insufficient documentation

## 2019-01-20 ENCOUNTER — Telehealth: Payer: Self-pay

## 2019-01-20 NOTE — Telephone Encounter (Signed)
Pt checking on status order is placed   Copied from [place] #[id number]. Topic: Referral - Status >> Jan 20, 2019  4:02 PM Gustavus Messing wrote: Reason for CRM: Patient calling for status update on [place]. Please return call

## 2019-01-27 ENCOUNTER — Telehealth: Payer: Self-pay | Admitting: Internal Medicine

## 2019-01-27 ENCOUNTER — Other Ambulatory Visit (INDEPENDENT_AMBULATORY_CARE_PROVIDER_SITE_OTHER): Payer: Medicare Other

## 2019-01-27 DIAGNOSIS — R740 Nonspecific elevation of levels of transaminase and lactic acid dehydrogenase [LDH]: Secondary | ICD-10-CM | POA: Diagnosis not present

## 2019-01-27 DIAGNOSIS — R748 Abnormal levels of other serum enzymes: Secondary | ICD-10-CM | POA: Diagnosis not present

## 2019-01-27 DIAGNOSIS — R7401 Elevation of levels of liver transaminase levels: Secondary | ICD-10-CM

## 2019-01-27 LAB — HEPATIC FUNCTION PANEL
ALT: 44 U/L — ABNORMAL HIGH (ref 0–35)
AST: 38 U/L — ABNORMAL HIGH (ref 0–37)
Albumin: 4.6 g/dL (ref 3.5–5.2)
Alkaline Phosphatase: 82 U/L (ref 39–117)
BILIRUBIN TOTAL: 0.6 mg/dL (ref 0.2–1.2)
Bilirubin, Direct: 0.1 mg/dL (ref 0.0–0.3)
Total Protein: 7.6 g/dL (ref 6.0–8.3)

## 2019-01-27 LAB — FERRITIN: Ferritin: 17.2 ng/mL (ref 10.0–291.0)

## 2019-01-27 MED ORDER — ACCU-CHEK MULTICLIX LANCETS MISC: 12 refills | Status: DC

## 2019-01-27 MED ORDER — GLUCOSE BLOOD VI STRP: ORAL_STRIP | 12 refills | Status: DC

## 2019-01-27 NOTE — Telephone Encounter (Signed)
Pt is here labs. Would like to request refill accu-chek guide mis roc, glucose blood test strips & lancets. Pt has a few days of supplies. Request refill to be sent to [facility] on [address] in [place].

## 2019-01-27 NOTE — Telephone Encounter (Signed)
Pt called stating she is returning call to [place] regarding [place]. Please call back at [phone].

## 2019-01-27 NOTE — Telephone Encounter (Addendum)
Refills sent as requested

## 2019-01-28 LAB — ANTI-SMITH ANTIBODY: ENA SM Ab Ser-aCnc: <1 AI

## 2019-01-29 ENCOUNTER — Telehealth: Payer: Self-pay

## 2019-01-29 LAB — IRON, TOTAL/TOTAL IRON BINDING CAP
%SAT: 17 % (calc) (ref 16–45)
Iron: 67 ug/dL (ref 45–160)
TIBC: 391 mcg/dL (calc) (ref 250–450)

## 2019-01-29 LAB — ANA: Anti Nuclear Antibody(ANA): NEGATIVE

## 2019-01-29 LAB — MITOCHONDRIAL ANTIBODIES

## 2019-01-29 NOTE — Telephone Encounter (Signed)
This was supposed to be routed to you

## 2019-01-29 NOTE — Telephone Encounter (Signed)
Copied from [place] [id number]. Topic: Referral - Status >> Jan 20, 2019  4:02 PM Gustavus Messing wrote: Reason for CRM: Patient calling for status update on [place]. Please return call >> Jan 29, 2019  3:11 PM Windy Kalata wrote: Patient returning call to Lydia Guiles states he will be home the rest of the day  Call back is [phone]

## 2019-02-02 NOTE — Telephone Encounter (Signed)
Thanks, I called pt twice left vmails and just called back today no call back. ;)

## 2019-02-02 NOTE — Telephone Encounter (Signed)
Sara Mills, if you get another message on this patient, she is trying to reach [name].

## 2019-02-09 ENCOUNTER — Other Ambulatory Visit: Payer: Self-pay

## 2019-02-09 ENCOUNTER — Ambulatory Visit
Admission: RE | Admit: 2019-02-09 | Discharge: 2019-02-09 | Disposition: A | Discharge status: Home / Self Care | Payer: Medicare Other | Source: Ambulatory Visit | Attending: Internal Medicine | Admitting: Internal Medicine

## 2019-02-09 DIAGNOSIS — R748 Abnormal levels of other serum enzymes: Secondary | ICD-10-CM | POA: Insufficient documentation

## 2019-02-09 DIAGNOSIS — K7689 Other specified diseases of liver: Secondary | ICD-10-CM | POA: Diagnosis not present

## 2019-02-17 ENCOUNTER — Ambulatory Visit (INDEPENDENT_AMBULATORY_CARE_PROVIDER_SITE_OTHER): Payer: Medicare Other | Admitting: Internal Medicine

## 2019-02-17 ENCOUNTER — Other Ambulatory Visit: Payer: Self-pay

## 2019-02-17 ENCOUNTER — Encounter: Payer: Self-pay | Admitting: Internal Medicine

## 2019-02-17 DIAGNOSIS — E1169 Type 2 diabetes mellitus with other specified complication: Secondary | ICD-10-CM | POA: Diagnosis not present

## 2019-02-17 DIAGNOSIS — E663 Overweight: Secondary | ICD-10-CM | POA: Diagnosis not present

## 2019-02-17 DIAGNOSIS — E785 Hyperlipidemia, unspecified: Secondary | ICD-10-CM | POA: Diagnosis not present

## 2019-02-17 DIAGNOSIS — I10 Essential (primary) hypertension: Secondary | ICD-10-CM

## 2019-02-17 DIAGNOSIS — E669 Obesity, unspecified: Secondary | ICD-10-CM

## 2019-02-17 DIAGNOSIS — K760 Fatty (change of) liver, not elsewhere classified: Secondary | ICD-10-CM

## 2019-02-17 MED ORDER — GLIPIZIDE 5 MG PO TABS: 2.5000 mg | ORAL_TABLET | Freq: Two times a day (BID) | ORAL | 3 refills | Status: DC

## 2019-02-17 MED ORDER — HEPATITIS A-HEP B RECOMB VAC 720-20 ELU-MCG/ML IM SUSP: 1.0000 mL | Freq: Once | INTRAMUSCULAR | 2 refills | Status: AC

## 2019-02-17 NOTE — Patient Instructions (Addendum)
Since you have fatty liver  Protecting it from infections is crucial to prevent liver faillure The hepatitis A and B combined Vaccine is given at zero,  1 month and 6 month intervals  Fatty liver cannot be cured ,  But if not managed with careful diet ,,  It will progress to cirrhosis   Diabetes   Overweight and cholesterol management are key    Your diabetes is not controlled on metformin alone .    I am starting you on glipizide  Start with 1/2 tablet twice daily with meals  Goal:  Your morning fasting  blood sugar should be 130 in the morning or less   goal 2 hr post prandial (2 hrs after eating a meal ) blood sugars should be 160 or less    Return in 3 months   Hepatitis A; Hepatitis B Vaccine injection What is this medicine? HEPATITIS A VACCINE; HEPATITIS B VACCINE (hep uh TAHY tis A vak SEEN; hep uh TAHY tis B vak SEEN) is a vaccine to protect from an infection with the hepatitis A and B virus. This vaccine does not contain the live viruses. It will not cause a hepatitis infection. This medicine may be used for other purposes; ask your health care provider or pharmacist if you have questions. COMMON BRAND NAME(S): Twinrix What should I tell my health care provider before I take this medicine? They need to know if you have any of these conditions: -bleeding disorder -fever or infection -heart disease -immune system problems -an unusual or allergic reaction to hepatitis A or B vaccine, neomycin, yeast, thimerosal, other medicines, foods, dyes, or preservatives -pregnant or trying to get pregnant -breast-feeding How should I use this medicine? This vaccine is for injection into a muscle. It is given by a health care professional. A copy of Vaccine Information Statements will be given before each vaccination. Read this sheet carefully each time. The sheet may change frequently. Talk to your pediatrician regarding the use of this medicine in children. Special care may be  needed. Overdosage: If you think you have taken too much of this medicine contact a poison control center or emergency room at once. NOTE: This medicine is only for you. Do not share this medicine with others. What if I miss a dose? It is important not to miss your dose. Call your doctor or health care professional if you are unable to keep an appointment. What may interact with this medicine? -medicines that suppress your immune function like adalimumab, anakinra, infliximab -medicines to treat cancer -steroid medicines like prednisone or cortisone This list may not describe all possible interactions. Give your health care provider a list of all the medicines, herbs, non-prescription drugs, or dietary supplements you use. Also tell them if you smoke, drink alcohol, or use illegal drugs. Some items may interact with your medicine. What should I watch for while using this medicine? See your health care provider for all shots of this vaccine as directed. You must have 3 to 4 shots of this vaccine for protection from hepatitis A and B infection. Tell your doctor right away if you have any serious or unusual side effects after getting this vaccine. What side effects may I notice from receiving this medicine? Side effects that you should report to your doctor or health care professional as soon as possible: -allergic reactions like skin rash, itching or hives, swelling of the face, lips, or tongue -breathing problems -confused, irritated -fast, irregular heartbeat -flu-like syndrome -numb, tingling pain -  seizures Side effects that usually do not require medical attention (report to your doctor or health care professional if they continue or are bothersome): -diarrhea -fever -headache -loss of appetite -muscle pain -nausea -pain, redness, swelling, or irritation at site where injected -tiredness This list may not describe all possible side effects. Call your doctor for medical advice about  side effects. You may report side effects to FDA at 1-800-FDA-1088. Where should I keep my medicine? This drug is given in a hospital or clinic and will not be stored at home. NOTE: This sheet is a summary. It may not cover all possible information. If you have questions about this medicine, talk to your doctor, pharmacist, or health care provider.  2019 Elsevier/Gold Standard (2008-04-08 15:21:37)

## 2019-02-17 NOTE — Progress Notes (Signed)
Subjective:  Patient ID: Sara Mills, female    DOB: [birth year]  Age: 68 y.o. MRN: [id number]  CC: Diagnoses of Overweight (BMI 25.0-29.9), Hyperlipidemia due to type 2 diabetes mellitus ([place]), Essential hypertension, Diabetes mellitus type 2 in obese ([place]), and Hepatic steatosis were pertinent to this visit.  HPI Sara Mills presents for 3 month follow up on diabetes, and labs/imaging studies  consistent with severe  fatty liver   Patient has no complaints today.  Patient is not following a low glycemic index diet but is  taking all prescribed medications regularly without side effects.  Fasting sugars have been  less than 155 most of the time and post prandials have been under 180 except on rare occasions. Patient is not exercising regularly or  intentionally trying to lose weight .  Patient has had an eye exam in the last 12 months and checks feet regularly for signs of infection.  Patient does not walk barefoot outside,  And denies any numbness tingling or burning in feet. Patient is up to date on all recommended vaccinations; vaccination for Hepatitis A and B was recommended today   Taking gabapentin and diclofenac for back pain,  Tramadol for severe pain , not more than once daily   Hypertension: patient checks blood pressure rarely at home.  Readings have been for the most part <140/80 at rest . Patient is following a reduced salt diet most days and is taking medications as prescribed  Outpatient Medications Prior to Visit  Medication Sig Dispense Refill  . amLODipine (NORVASC) 5 MG tablet TAKE 1 TABLET BY MOUTH ONCE A DAY 90 tablet 1  . diclofenac (VOLTAREN) 75 MG EC tablet TAKE 1 TABLET BY MOUTH TWICE A DAY 60 tablet 2  . gabapentin (NEURONTIN) 300 MG capsule Take 300 mg by mouth 3 (three) times daily.   0  . glucose blood test strip For Accu Check Glucometer .  Use twice daily E11.9 100 each 12  . hydrochlorothiazide (MICROZIDE) 12.5 MG capsule TAKE 1 CAPSULE BY MOUTH ONCE DAILY  90 capsule 1  . Lancets (ACCU-CHEK MULTICLIX) lancets Use as instructed Use twice daily to check CBG's DX E11.9 100 each 12  . omeprazole (PRILOSEC) 40 MG capsule TAKE 1 CAPSULE BY MOUTH ONCE DAILY 90 capsule 1  . rosuvastatin (CRESTOR) 20 MG tablet TAKE 1 TABLET BY MOUTH ONCE A DAY 90 tablet 1  . traMADol (ULTRAM) 50 MG tablet TAKE 1 TABLET BY MOUTH EVERY 6 HOURS AS NEEDED 30 tablet 0  . valACYclovir (VALTREX) 500 MG tablet Take 1 tablet (500 mg total) by mouth 3 (three) times daily. 21 tablet 2  . zolpidem (AMBIEN) 10 MG tablet TAKE 1 TABLET BY MOUTH AT BEDTIME AS NEEDED 30 tablet 5  . metFORMIN (GLUCOPHAGE) 1000 MG tablet Take 1 tablet (1,000 mg total) by mouth 2 (two) times daily with a meal. 30 tablet 5   No facility-administered medications prior to visit.     Review of Systems;  Patient denies headache, fevers, malaise, unintentional weight loss, skin rash, eye pain, sinus congestion and sinus pain, sore throat, dysphagia,  hemoptysis , cough, dyspnea, wheezing, chest pain, palpitations, orthopnea, edema, abdominal pain, nausea, melena, diarrhea, constipation, flank pain, dysuria, hematuria, urinary  Frequency, nocturia, numbness, tingling, seizures,  Focal weakness, Loss of consciousness,  Tremor, insomnia, depression, anxiety, and suicidal ideation.      Objective:  BP 120/78 (BP Location: Left Arm, Patient Position: Sitting, Cuff Size: Normal)   Pulse 73  Temp (!) 97.5 F (36.4 C) (Oral)   Resp 15   Ht 5\' 1"  (1.549 m)   Wt 154 lb 3.2 oz (69.9 kg)   SpO2 97%   BMI 29.14 kg/m   BP Readings from Last 3 Encounters:  02/17/19 120/78  11/17/18 110/72  09/28/18 106/70    Wt Readings from Last 3 Encounters:  02/17/19 154 lb 3.2 oz (69.9 kg)  11/17/18 154 lb 12.8 oz (70.2 kg)  09/28/18 153 lb (69.4 kg)    General appearance: alert, cooperative and appears stated age Ears: normal TM's and external ear canals both ears Throat: lips, mucosa, and tongue normal; teeth and  gums normal Neck: no adenopathy, no carotid bruit, supple, symmetrical, trachea midline and thyroid not enlarged, symmetric, no tenderness/mass/nodules Back: symmetric, no curvature. ROM normal. No CVA tenderness. Lungs: clear to auscultation bilaterally Heart: regular rate and rhythm, S1, S2 normal, no murmur, click, rub or gallop Abdomen: soft, non-tender; bowel sounds normal; no masses,  no organomegaly Pulses: 2+ and symmetric Skin: Skin color, texture, turgor normal. No rashes or lesions Lymph nodes: Cervical, supraclavicular, and axillary nodes normal.  Lab Results  Component Value Date   HGBA1C 7.8 (H) 12/31/2018   HGBA1C 7.3 (A) 09/28/2018   HGBA1C 8.0 (H) 12/17/2017    Lab Results  Component Value Date   CREATININE 0.84 12/31/2018   CREATININE 0.98 09/28/2018   CREATININE 0.75 12/17/2017    Lab Results  Component Value Date   WBC 7.3 10/16/2016   HGB 13.1 10/16/2016   HCT 40.0 10/16/2016   PLT 181.0 10/16/2016   GLUCOSE 128 (H) 12/31/2018   CHOL 148 12/31/2018   TRIG 171.0 (H) 12/31/2018   HDL 42.30 12/31/2018   LDLDIRECT 71.0 06/02/2017   LDLCALC 71 12/31/2018   ALT 44 (H) 01/27/2019   AST 38 (H) 01/27/2019   NA 139 12/31/2018   K 3.7 12/31/2018   CL 99 12/31/2018   CREATININE 0.84 12/31/2018   BUN 20 12/31/2018   CO2 30 12/31/2018   TSH 1.57 10/16/2016   HGBA1C 7.8 (H) 12/31/2018   MICROALBUR 2.3 (H) 11/17/2018    US Abdomen Limited Ruq  Result Date: 02/09/2019 CLINICAL DATA:  Elevated liver function studies. History of diabetes and obesity. EXAM: ULTRASOUND ABDOMEN LIMITED RIGHT UPPER QUADRANT COMPARISON:  Abdominal CT 05/03/2006 FINDINGS: Gallbladder: No gallstones or wall thickening visualized. No sonographic Murphy sign noted by sonographer. Common bile duct: Diameter: 6 mm Liver: The hepatic echogenicity is diffusely increased, corresponding with severe steatosis on prior CT. There is focal sparing around the gallbladder, but no focal lesion. Portal  vein is patent on color Doppler imaging with normal direction of blood flow towards the liver. IMPRESSION: 1. Echogenic liver corresponding with hepatic steatosis on prior CT. No focal abnormality. 2. The gallbladder and biliary system appear normal. Electronically Signed   By: Richardean Sale M.D.   On: 02/09/2019 13:35    Assessment & Plan:   Problem List Items Addressed This Visit    Overweight (BMI 25.0-29.9)     I have addressed  BMI and recommended a low glycemic index diet regular participation in aerobic exercise with a goal of 30 minutes of aerobic exercise a minimum of 5 days per week.       Hyperlipidemia due to type 2 diabetes mellitus (H[facility])    LDL is at goal on rosuvastatin.  No changes today  Lab Results  Component Value Date   CHOL 148 12/31/2018   HDL 42.30 12/31/2018  LDLCALC 71 12/31/2018   LDLDIRECT 71.0 06/02/2017   TRIG 171.0 (H) 12/31/2018   CHOLHDL 3 12/31/2018   Lab Results  Component Value Date   ALT 44 (H) 01/27/2019   AST 38 (H) 01/27/2019   ALKPHOS 82 01/27/2019   BILITOT 0.6 01/27/2019         Relevant Medications   glipiZIDE (GLUCOTROL) 5 MG tablet   Hepatic steatosis    Severe, suggested  by ultrasound changes and serologies negative for autoimmune causes of hepatitis.  Current liver enzymes are abnormal and all modifiable risk factors including obesity, diabetes and hyperlipidemia have been addressed       Essential hypertension    Well controlled on current regimen. Renal function stable, no changes today.  Lab Results  Component Value Date   CREATININE 0.84 12/31/2018   Lab Results  Component Value Date   NA 139 12/31/2018   K 3.7 12/31/2018   CL 99 12/31/2018   CO2 30 12/31/2018         Diabetes mellitus type 2 in obese ([place])    Not at goal des[ite Increasing metformin to 1000 mg bid for management of postprandial hyperglycemia. Adding 2.5 mg glipizide bid  For a1c  > 7.0.   She has microalbuminuria but has a history of  allergic reaction to ACE Inhibitor.   Lab Results  Component Value Date   HGBA1C 7.8 (H) 12/31/2018   Lab Results  Component Value Date   MICROALBUR 2.3 (H) 11/17/2018           Relevant Medications   glipiZIDE (GLUCOTROL) 5 MG tablet     A total of 40 minutes was spent with patient more than half of which was spent in counseling patient on the above mentioned issues , reviewing and explaining recent labs and imaging studies done, and coordination of care.  I am having Sara Mills start on hepatitis A-hepatitis B and glipiZIDE. I am also having her maintain her valACYclovir, omeprazole, gabapentin, rosuvastatin, amLODipine, zolpidem, traMADol, diclofenac, hydrochlorothiazide, glucose blood, and accu-chek multiclix.  Meds ordered this encounter  Medications  . hepatitis A-hepatitis B (TWINRIX) 720-20 ELU-MCG/ML injection    Sig: Inject 1 mL into the muscle once for 1 dose.    Dispense:  0.5 mL    Refill:  2  . glipiZIDE (GLUCOTROL) 5 MG tablet    Sig: Take 0.5 tablets (2.5 mg total) by mouth 2 (two) times daily before a meal.    Dispense:  60 tablet    Refill:  3    There are no discontinued medications.  Follow-up: No follow-ups on file.   Crecencio Mc, MD

## 2019-02-19 ENCOUNTER — Other Ambulatory Visit: Payer: Self-pay | Admitting: Internal Medicine

## 2019-02-20 ENCOUNTER — Encounter: Payer: Self-pay | Admitting: Internal Medicine

## 2019-02-20 DIAGNOSIS — T783XXA Angioneurotic edema, initial encounter: Secondary | ICD-10-CM

## 2019-02-20 DIAGNOSIS — T464X5A Adverse effect of angiotensin-converting-enzyme inhibitors, initial encounter: Secondary | ICD-10-CM | POA: Insufficient documentation

## 2019-02-20 DIAGNOSIS — K760 Fatty (change of) liver, not elsewhere classified: Secondary | ICD-10-CM | POA: Insufficient documentation

## 2019-02-20 NOTE — Assessment & Plan Note (Signed)
Well controlled on current regimen. Renal function stable, no changes today.  Lab Results  Component Value Date   CREATININE 0.84 12/31/2018   Lab Results  Component Value Date   NA 139 12/31/2018   K 3.7 12/31/2018   CL 99 12/31/2018   CO2 30 12/31/2018

## 2019-02-20 NOTE — Assessment & Plan Note (Signed)
Severe, suggested  by ultrasound changes and serologies negative for autoimmune causes of hepatitis.  Current liver enzymes are abnormal and all modifiable risk factors including obesity, diabetes and hyperlipidemia have been addressed

## 2019-02-20 NOTE — Assessment & Plan Note (Addendum)
I have addressed  BMI and recommended a low glycemic index diet regular participation in aerobic exercise with a goal of 30 minutes of aerobic exercise a minimum of 5 days per week.  

## 2019-02-20 NOTE — Assessment & Plan Note (Signed)
Not at goal des[ite Increasing metformin to 1000 mg bid for management of postprandial hyperglycemia. Adding 2.5 mg glipizide bid  For a1c  > 7.0.   She has microalbuminuria but has a history of allergic reaction to ACE Inhibitor.   Lab Results  Component Value Date   HGBA1C 7.8 (H) 12/31/2018   Lab Results  Component Value Date   MICROALBUR 2.3 (H) 11/17/2018

## 2019-02-20 NOTE — Assessment & Plan Note (Signed)
LDL is at goal on rosuvastatin.  No changes today  Lab Results  Component Value Date   CHOL 148 12/31/2018   HDL 42.30 12/31/2018   LDLCALC 71 12/31/2018   LDLDIRECT 71.0 06/02/2017   TRIG 171.0 (H) 12/31/2018   CHOLHDL 3 12/31/2018   Lab Results  Component Value Date   ALT 44 (H) 01/27/2019   AST 38 (H) 01/27/2019   ALKPHOS 82 01/27/2019   BILITOT 0.6 01/27/2019

## 2019-03-03 ENCOUNTER — Other Ambulatory Visit: Payer: Self-pay

## 2019-03-08 ENCOUNTER — Other Ambulatory Visit: Payer: Self-pay

## 2019-03-08 MED ORDER — METFORMIN HCL 1000 MG PO TABS: ORAL_TABLET | ORAL | 1 refills | Status: DC

## 2019-03-11 ENCOUNTER — Other Ambulatory Visit: Payer: Self-pay | Admitting: Internal Medicine

## 2019-03-19 ENCOUNTER — Ambulatory Visit: Payer: Self-pay | Admitting: Internal Medicine

## 2019-03-19 NOTE — Telephone Encounter (Signed)
Pt called in c/o her right lower eyelid being sore and swollen down into her cheek.   She was hoping to be seen today or have an antibiotic called in or something.   I let her know the office was closed today for Good Friday. See notes below.    I suggested she go to the urgent care however she doesn't really want to go now.   Due to the COVID-19 pandemic she does not want to go to the ED.   I encouraged her to at least go to the urgent care if her eye continues to remain swollen or gets worse.   Also if she develops fever to go to the urgent care.   She was agreeable to this plan that she would go to the urgent care.   I let her know she needed to be seen within 24 hrs.   That was why she called the office not knowing it was closed.   She had already called her eye doctor but they are closed too.  She is going to call us back first thing Monday if her eye is not better or remains as it is now.    Reason for Disposition . Eyelid is red and painful (or tender to touch)  Answer Assessment - Initial Assessment Questions 1. ONSET: "When did the swelling start?" (e.g., minutes, hours, days)     Right eye is swollen.  Tuesday it felt sore on my eyelid.   There is nothing visable  there.   Yesterday it got swollen.   Today it's more swollen and very sore.   I don't see anything.  It's red and swollen.   I've been working in the yard for the last 2 weeks.   Maybe it's the pollen?   2. LOCATION: "What part of the eyelids is swollen?"     My right eyelid.  The bottom lid is swollen into my cheek.   It's sore in the corner beside my nose.   My top lid is fine. 3. SEVERITY: "How swollen is it?"     It goes down into my cheek.   Yesterday it was swollen enough I could see it like something sitting on my cheek.   I'm using ice and heat on it.    4. ITCHING: "Is there any itching?" If so, ask: "How much?"   (Scale 1-10; mild, moderate or severe)     My eye is not red.   No itching.  More sore. 5. PAIN: "Is the  swelling painful to touch?" If so, ask: "How painful is it?"   (Scale 1-10; mild, moderate or severe)     See very sore. 6. FEVER: "Do you have a fever?" If so, ask: "What is it, how was it measured, and when did it start?"      No 7. CAUSE: "What do you think is causing the swelling?"     I don't know.   I worked in the yard for the last 2 yrs. 8. RECURRENT SYMPTOM: "Have you had eyelid swelling before?" If so, ask: "When was the last time?" "What happened that time?"     No 9. OTHER SYMPTOMS: "Do you have any other symptoms?" (e.g., blurred vision, eye discharge, rash, runny nose)     My vision is fine.    This morning I had the first crusty drainage.    10. PREGNANCY: "Is there any chance you are pregnant?" "When was your last menstrual period?"  Not asked due to age  Protocols used: EYE [facility]

## 2019-03-22 ENCOUNTER — Encounter: Payer: Medicare Other | Admitting: Internal Medicine

## 2019-03-22 NOTE — Telephone Encounter (Signed)
Called to make sure the pt was not having any trouble getting connected to [email or web address] and she stated that she just spoke with her eye doctor and they told her that it could be a stye and to just use warm compresses. Pt stated that she is just going to try that for today.

## 2019-03-22 NOTE — Telephone Encounter (Signed)
Spoke with pt and she has been scheduled for 11am today.

## 2019-03-30 ENCOUNTER — Other Ambulatory Visit: Payer: Self-pay | Admitting: Internal Medicine

## 2019-05-05 ENCOUNTER — Other Ambulatory Visit: Payer: Self-pay | Admitting: Internal Medicine

## 2019-05-06 NOTE — Telephone Encounter (Signed)
Refilled: 10/27/2018 Last OV: 03/22/2019 Next OV: 05/20/2019

## 2019-05-20 ENCOUNTER — Other Ambulatory Visit: Payer: Self-pay

## 2019-05-20 ENCOUNTER — Ambulatory Visit (INDEPENDENT_AMBULATORY_CARE_PROVIDER_SITE_OTHER): Payer: Medicare Other | Admitting: Internal Medicine

## 2019-05-20 DIAGNOSIS — E1169 Type 2 diabetes mellitus with other specified complication: Secondary | ICD-10-CM

## 2019-05-20 DIAGNOSIS — E669 Obesity, unspecified: Secondary | ICD-10-CM | POA: Diagnosis not present

## 2019-05-20 DIAGNOSIS — E785 Hyperlipidemia, unspecified: Secondary | ICD-10-CM | POA: Diagnosis not present

## 2019-05-20 DIAGNOSIS — B009 Herpesviral infection, unspecified: Secondary | ICD-10-CM | POA: Diagnosis not present

## 2019-05-20 DIAGNOSIS — T783XXA Angioneurotic edema, initial encounter: Secondary | ICD-10-CM

## 2019-05-20 DIAGNOSIS — W57XXXA Bitten or stung by nonvenomous insect and other nonvenomous arthropods, initial encounter: Secondary | ICD-10-CM

## 2019-05-20 DIAGNOSIS — S30861A Insect bite (nonvenomous) of abdominal wall, initial encounter: Secondary | ICD-10-CM

## 2019-05-20 DIAGNOSIS — K760 Fatty (change of) liver, not elsewhere classified: Secondary | ICD-10-CM | POA: Diagnosis not present

## 2019-05-20 MED ORDER — CEPHALEXIN 500 MG PO CAPS: 500.0000 mg | ORAL_CAPSULE | Freq: Four times a day (QID) | ORAL | 0 refills | Status: DC

## 2019-05-20 NOTE — Patient Instructions (Addendum)
Since you have a lot of skin allergies,  I recommend daily use of generic zyrtec, which is cetirizine  Or   Allegra which is available generically as fexofenadine and it comes in 60 mg and 180 mg once daily strengths. Use the 180 mg oose   I am prescribing Keflex antibiotic for your skin infection.     I WILL CHECK TO SEE YOU IF HAVE HAD LYMES DISEASE OR [place] SPOTTED FEVER when you have your labs done

## 2019-05-20 NOTE — Progress Notes (Signed)
Virtual Visit converted to Telephone Note   This visit type was conducted due to national recommendations for restrictions regarding the COVID-19 pandemic (e.g. social distancing).  This format is felt to be most appropriate for this patient at this time.  All issues noted in this document were discussed and addressed.  No physical exam was performed (except for noted visual exam findings with Video Visits).   I attempted to connect with@ on 05/20/19 at  8:00 AM EDT by a video enabled telemedicine application ; however  Interactive audio and video telecommunications  Ultimately failed, due to patient having technical difficulties.   We continued and completed visit with audio only. I and verified that I am speaking with the correct person using two identifiers.  Location patient: home Location provider: work or home office Persons participating in the virtual visit: patient, provider  I discussed the limitations, risks, security and privacy concerns of performing an evaluation and management service by telephone and the availability of in person appointments. I also discussed with the patient that there may be a patient responsible charge related to this service. The patient expressed understanding and agreed to proceed. Reason for visit: tick bite, follow up on Type 2 DM  HPI:  68 yr old female with type  2 DM ,  NASH, presents for follow up   DM:  She feels generally well, is walking several times per week and checking blood sugars once daily at variable times.  BS have been under 130 fasting and < 150 post prandially.  Denies any recent hypoglyemic events.  Taking hermedications as directed. Following a carbohydrate modified diet 6 days per week. Denies numbness, burning and tingling of extremities. Appetite is good.   2) tick bite:  Found on Saturday.  Has developed redness in a circumferential pattern approximately 1 inch in rad.us, with blistering noted at the periphery. Denies fevers,   Headache,  And cough.  3) The patient has no signs or symptoms of COVID 19 infection (fever, cough, sore throat  or shortness of breath beyond what is typical for patient).  Patient denies contact with other persons with the above mentioned symptoms or with anyone confirmed to have COVID 19    ROS: See pertinent positives and negatives per HPI.  Past Medical History:  Diagnosis Date  . Diabetes mellitus   . GERD (gastroesophageal reflux disease)   . Hyperlipidemia   . Hypertension     Past Surgical History:  Procedure Laterality Date  . ABDOMINAL HYSTERECTOMY  1995    and BSO  . APPENDECTOMY    . BILATERAL OOPHORECTOMY    . BUNIONECTOMY    . CARPAL TUNNEL RELEASE Right   . CESAREAN SECTION  1977 and 1978    Family History  Problem Relation Age of Onset  . Stroke Mother   . Heart murmur Mother   . Mitral valve prolapse Mother   . Cancer Father 78       carcinoid syndrome  . Stomach cancer Father   . Breast cancer Neg Hx     SOCIAL HX:  reports that she has never smoked. She has never used smokeless tobacco. She reports that she does not drink alcohol or use drugs.   Current Outpatient Medications:  .  amLODipine (NORVASC) 5 MG tablet, TAKE 1 TABLET BY MOUTH ONCE DAILY, Disp: 90 tablet, Rfl: 1 .  diclofenac (VOLTAREN) 75 MG EC tablet, TAKE 1 TABLET BY MOUTH TWICE A DAY, Disp: 60 tablet, Rfl: 2 .  gabapentin (  NEURONTIN) 300 MG capsule, Take 300 mg by mouth 3 (three) times daily. , Disp: , Rfl: 0 .  glipiZIDE (GLUCOTROL) 5 MG tablet, Take 0.5 tablets (2.5 mg total) by mouth 2 (two) times daily before a meal., Disp: 60 tablet, Rfl: 3 .  glucose blood test strip, For Accu Check Glucometer .  Use twice daily E11.9, Disp: 100 each, Rfl: 12 .  hydrochlorothiazide (MICROZIDE) 12.5 MG capsule, TAKE 1 CAPSULE BY MOUTH ONCE DAILY, Disp: 90 capsule, Rfl: 1 .  Lancets (ACCU-CHEK MULTICLIX) lancets, Use as instructed Use twice daily to check CBG's DX E11.9, Disp: 100 each, Rfl: 12 .   metFORMIN (GLUCOPHAGE) 1000 MG tablet, TAKE 1 TABLET BY MOUTH 2 TIMES DAILY WITH A MEAL, Disp: 180 tablet, Rfl: 1 .  omeprazole (PRILOSEC) 40 MG capsule, TAKE 1 CAPSULE BY MOUTH ONCE DAILY, Disp: 90 capsule, Rfl: 1 .  rosuvastatin (CRESTOR) 20 MG tablet, TAKE 1 TABLET BY MOUTH ONCE A DAY, Disp: 90 tablet, Rfl: 1 .  traMADol (ULTRAM) 50 MG tablet, TAKE 1 TABLET BY MOUTH EVERY 6 HOURS AS NEEDED, Disp: 30 tablet, Rfl: 0 .  valACYclovir (VALTREX) 500 MG tablet, Take 1 tablet (500 mg total) by mouth 3 (three) times daily., Disp: 21 tablet, Rfl: 2 .  zolpidem (AMBIEN) 10 MG tablet, TAKE 1 TABLET BY MOUTH AT BEDTIME AS NEEDED FOR SLEEP, Disp: 30 tablet, Rfl: 2 .  cephALEXin (KEFLEX) 500 MG capsule, Take 1 capsule (500 mg total) by mouth 4 (four) times daily., Disp: 28 capsule, Rfl: 0 .  triamcinolone cream (KENALOG) 0.1 %, Apply 1 application topically 2 (two) times daily., Disp: 30 g, Rfl: 0  EXAM:   General impression: alert, cooperative and articulate.  No signs of being in distress  Lungs: speech is fluent sentence length suggests that patient is not short of breath and not punctuated by cough, sneezing or sniffing. [name]   Psych: affect normal.  speech is articulate and non pressured .  Denies suicidal thoughts  Photograph uploaded of tick bite: no erythema migrans,  Some blistering on the periphery   ASSESSMENT AND PLAN:  Discussed the following assessment and plan:   Angioedema Occurred while taking an ACE Inhibitor , has not recurred since stopping it  Tick bite of abdominal wall Recurrent, with recent episode resulting in a localized reaction .  Photographs reviewed, no evidence of erythema migrans and no symptoms of RMSF (fever,  headache).  Will use keflex to resolve reaction  And triamcinolone for the itching   Diabetes mellitus type 2 in obese She has been taking the  Increased metformin dose of 1000 mg bid for management of postprandial hyperglycemia and  2.5 mg glipizide bid  For a1c   > 7.0.   She has microalbuminuria but has a history of allergic reaction to ACE Inhibitor.  Fasting sugars have improved and are generally < 140  Repeat A1cis due   Lab Results  Component Value Date   HGBA1C 7.8 (H) 12/31/2018   Lab Results  Component Value Date   MICROALBUR 2.3 (H) 11/17/2018       Hepatic steatosis Severe, suggested  by ultrasound changes and serologies negative for autoimmune causes of hepatitis.  Recent liver enzymes are abnormal and all modifiable risk factors including obesity, diabetes and hyperlipidemia have been addressed   Lab Results  Component Value Date   ALT 44 (H) 01/27/2019   AST 38 (H) 01/27/2019   ALKPHOS 82 01/27/2019   BILITOT 0.6 01/27/2019  I discussed the assessment and treatment plan with the patient. The patient was provided an opportunity to ask questions and all were answered. The patient agreed with the plan and demonstrated an understanding of the instructions.   The patient was advised to call back or seek an in-person evaluation if the symptoms worsen or if the condition fails to improve as anticipated.  I provided 25 minutes of non-face-to-face time during this encounter.   Crecencio Mc, MD

## 2019-05-20 NOTE — Progress Notes (Signed)
Pt states that she has really bad reactions to ticks. Pt stated that she has pulled three ticks off of her in the last 2 months. The pt stated that once she pulls it off the area gets really inflamed, red, itchy, blistery looking around the edge.

## 2019-05-21 ENCOUNTER — Other Ambulatory Visit: Payer: Self-pay | Admitting: Internal Medicine

## 2019-05-21 MED ORDER — TRIAMCINOLONE ACETONIDE 0.1 % EX CREA: 1.0000 "application " | TOPICAL_CREAM | Freq: Two times a day (BID) | CUTANEOUS | 0 refills | Status: DC

## 2019-05-22 DIAGNOSIS — W57XXXA Bitten or stung by nonvenomous insect and other nonvenomous arthropods, initial encounter: Secondary | ICD-10-CM | POA: Insufficient documentation

## 2019-05-22 DIAGNOSIS — S30861A Insect bite (nonvenomous) of abdominal wall, initial encounter: Secondary | ICD-10-CM | POA: Insufficient documentation

## 2019-05-22 NOTE — Assessment & Plan Note (Addendum)
Recurrent, with recent episode resulting in a localized reaction .  Photographs reviewed, no evidence of erythema migrans and no symptoms of RMSF (fever,  headache).  Will use keflex to resolve reaction  And triamcinolone for the itching

## 2019-05-22 NOTE — Assessment & Plan Note (Signed)
She has been taking the  Increased metformin dose of 1000 mg bid for management of postprandial hyperglycemia and  2.5 mg glipizide bid  For a1c  > 7.0.   She has microalbuminuria but has a history of allergic reaction to ACE Inhibitor.  Fasting sugars have improved and are generally < 140  Repeat A1cis due   Lab Results  Component Value Date   HGBA1C 7.8 (H) 12/31/2018   Lab Results  Component Value Date   MICROALBUR 2.3 (H) 11/17/2018

## 2019-05-22 NOTE — Assessment & Plan Note (Signed)
Occurred while taking an ACE Inhibitor , has not recurred since stopping it

## 2019-05-22 NOTE — Assessment & Plan Note (Signed)
Severe, suggested  by ultrasound changes and serologies negative for autoimmune causes of hepatitis.  Recent liver enzymes are abnormal and all modifiable risk factors including obesity, diabetes and hyperlipidemia have been addressed   Lab Results  Component Value Date   ALT 44 (H) 01/27/2019   AST 38 (H) 01/27/2019   ALKPHOS 82 01/27/2019   BILITOT 0.6 01/27/2019

## 2019-05-25 ENCOUNTER — Other Ambulatory Visit: Payer: Self-pay

## 2019-05-25 MED ORDER — ACCU-CHEK FASTCLIX LANCETS MISC: 2 refills | Status: DC

## 2019-06-04 ENCOUNTER — Other Ambulatory Visit: Payer: Self-pay | Admitting: Internal Medicine

## 2019-06-07 ENCOUNTER — Other Ambulatory Visit: Payer: Self-pay

## 2019-06-07 ENCOUNTER — Other Ambulatory Visit (INDEPENDENT_AMBULATORY_CARE_PROVIDER_SITE_OTHER): Payer: Medicare Other

## 2019-06-07 DIAGNOSIS — S30861A Insect bite (nonvenomous) of abdominal wall, initial encounter: Secondary | ICD-10-CM

## 2019-06-07 DIAGNOSIS — E669 Obesity, unspecified: Secondary | ICD-10-CM

## 2019-06-07 DIAGNOSIS — E1169 Type 2 diabetes mellitus with other specified complication: Secondary | ICD-10-CM | POA: Diagnosis not present

## 2019-06-07 DIAGNOSIS — E785 Hyperlipidemia, unspecified: Secondary | ICD-10-CM

## 2019-06-07 DIAGNOSIS — W57XXXA Bitten or stung by nonvenomous insect and other nonvenomous arthropods, initial encounter: Secondary | ICD-10-CM

## 2019-06-07 DIAGNOSIS — K760 Fatty (change of) liver, not elsewhere classified: Secondary | ICD-10-CM | POA: Diagnosis not present

## 2019-06-07 LAB — COMPREHENSIVE METABOLIC PANEL
ALT: 28 U/L (ref 0–35)
AST: 28 U/L (ref 0–37)
Albumin: 4.5 g/dL (ref 3.5–5.2)
Alkaline Phosphatase: 77 U/L (ref 39–117)
BUN: 19 mg/dL (ref 6–23)
CO2: 29 mEq/L (ref 19–32)
Calcium: 9.4 mg/dL (ref 8.4–10.5)
Chloride: 102 mEq/L (ref 96–112)
Creatinine, Ser: 0.83 mg/dL (ref 0.40–1.20)
GFR: 68.81 mL/min (ref >60.00)
Glucose, Bld: 93 mg/dL (ref 70–99)
Potassium: 4.1 mEq/L (ref 3.5–5.1)
Sodium: 139 mEq/L (ref 135–145)
Total Bilirubin: 0.4 mg/dL (ref 0.2–1.2)
Total Protein: 7.2 g/dL (ref 6.0–8.3)

## 2019-06-07 LAB — LIPID PANEL
Cholesterol: 136 mg/dL (ref 0–200)
HDL: 45.7 mg/dL (ref >39.00)
LDL Cholesterol: 56 mg/dL (ref 0–99)
NonHDL: 90.69
Total CHOL/HDL Ratio: 3
Triglycerides: 171 mg/dL — ABNORMAL HIGH (ref 0.0–149.0)
VLDL: 34.2 mg/dL (ref 0.0–40.0)

## 2019-06-07 LAB — HEMOGLOBIN A1C: Hgb A1c MFr Bld: 7 % — ABNORMAL HIGH (ref 4.6–6.5)

## 2019-06-07 NOTE — Addendum Note (Signed)
Addended by: Leeanne Rio on: 06/07/2019 08:43 AM   Modules accepted: Orders

## 2019-06-09 LAB — ROCKY MTN SPOTTED FVR ABS PNL(IGG+IGM)
RMSF IgG: NEGATIVE
RMSF IgM: 0.27 index (ref 0.00–0.89)

## 2019-06-09 LAB — LYME AB/WESTERN BLOT REFLEX
LYME DISEASE AB, QUANT, IGM: <0.8 index (ref 0.00–0.79)
Lyme IgG/IgM Ab: <0.91 {ISR} (ref 0.00–0.90)

## 2019-06-17 MED ORDER — GLUCOSE BLOOD VI STRP: ORAL_STRIP | 12 refills | Status: DC

## 2019-06-17 MED ORDER — ACCU-CHEK FASTCLIX LANCETS MISC: 11 refills | Status: DC

## 2019-07-14 DIAGNOSIS — H6123 Impacted cerumen, bilateral: Secondary | ICD-10-CM | POA: Diagnosis not present

## 2019-07-14 DIAGNOSIS — H9319 Tinnitus, unspecified ear: Secondary | ICD-10-CM | POA: Diagnosis not present

## 2019-07-14 DIAGNOSIS — H903 Sensorineural hearing loss, bilateral: Secondary | ICD-10-CM | POA: Diagnosis not present

## 2019-08-02 ENCOUNTER — Other Ambulatory Visit: Payer: Self-pay

## 2019-08-02 MED ORDER — METFORMIN HCL 1000 MG PO TABS: ORAL_TABLET | ORAL | 1 refills | Status: DC

## 2019-08-28 ENCOUNTER — Other Ambulatory Visit: Payer: Self-pay | Admitting: Internal Medicine

## 2019-09-02 ENCOUNTER — Other Ambulatory Visit: Payer: Self-pay

## 2019-09-03 ENCOUNTER — Ambulatory Visit (INDEPENDENT_AMBULATORY_CARE_PROVIDER_SITE_OTHER): Payer: Medicare Other | Admitting: Internal Medicine

## 2019-09-03 ENCOUNTER — Other Ambulatory Visit: Payer: Self-pay

## 2019-09-03 ENCOUNTER — Encounter: Payer: Self-pay | Admitting: Internal Medicine

## 2019-09-03 VITALS — BP 122/80 | HR 75 | Temp 97.5°F | Resp 15 | Ht 61.0 in | Wt 153.2 lb

## 2019-09-03 DIAGNOSIS — L03032 Cellulitis of left toe: Secondary | ICD-10-CM

## 2019-09-03 DIAGNOSIS — E1169 Type 2 diabetes mellitus with other specified complication: Secondary | ICD-10-CM | POA: Diagnosis not present

## 2019-09-03 DIAGNOSIS — L509 Urticaria, unspecified: Secondary | ICD-10-CM | POA: Diagnosis not present

## 2019-09-03 DIAGNOSIS — E785 Hyperlipidemia, unspecified: Secondary | ICD-10-CM | POA: Diagnosis not present

## 2019-09-03 DIAGNOSIS — T783XXS Angioneurotic edema, sequela: Secondary | ICD-10-CM | POA: Diagnosis not present

## 2019-09-03 DIAGNOSIS — T783XXA Angioneurotic edema, initial encounter: Secondary | ICD-10-CM

## 2019-09-03 DIAGNOSIS — E669 Obesity, unspecified: Secondary | ICD-10-CM

## 2019-09-03 DIAGNOSIS — E041 Nontoxic single thyroid nodule: Secondary | ICD-10-CM

## 2019-09-03 DIAGNOSIS — Z23 Encounter for immunization: Secondary | ICD-10-CM | POA: Diagnosis not present

## 2019-09-03 MED ORDER — EPINEPHRINE 0.3 MG/0.3ML IJ SOAJ: 0.3000 mg | Freq: Once | INTRAMUSCULAR | 0 refills | Status: AC

## 2019-09-03 MED ORDER — DOXYCYCLINE HYCLATE 100 MG PO TABS: 100.0000 mg | ORAL_TABLET | Freq: Two times a day (BID) | ORAL | 0 refills | Status: DC

## 2019-09-03 MED ORDER — MONTELUKAST SODIUM 10 MG PO TABS: 10.0000 mg | ORAL_TABLET | Freq: Every day | ORAL | 3 refills | Status: DC

## 2019-09-03 NOTE — Progress Notes (Signed)
Subjective:  Patient ID: Sara Mills, female    DOB: 28-Jul-1951  Age: 68 y.o. MRN: [id number]  CC: The primary encounter diagnosis was Hyperlipidemia due to type 2 diabetes mellitus ([place]). Diagnoses of Angioedema, initial encounter, Diabetes mellitus type 2 in obese [facility]), Thyroid nodule, Need for immunization against influenza, Angioedema, sequela, Full body hives, THYROID NODULE, and Cellulitis of second toe of left foot were also pertinent to this visit.  HPI Sara Mills presents for evaluation and treatment of skin infection on foot .  The infection started several days ago after removing a tick from the web space between her toes ( 2 and 3 on her left foot. ) within 24 hours she developed intense itching accompanied by redness and bullous inflammation on  Tuesday nite.  She states that she began  poking at it with a sterilized  needle because it had developed pus filled bumps  .  The swelling has improved  compared to yesterday . She has also been applying topical  triamcinolone ointment and benzocaine spray to help manage the itching   Previous tick bit of abdomen in june was treated with keflex  2) recurrent hives/angioedema.  Patient has continued to have episodes of hives  since stopping the lisinopril in June 2018. She has not resumed lisinopril ,  Does not take an ARB either..  Has been using zyrtec daily.  Last episode occurred Saturday night and was the most serious.  It started with pruritis from head to toe , which woke her from sleep and was accompanied by  hives and tongue swelling. She did not use her epi pen, it had expired .  Took 75 mg benadryl and resolved in 1.5 hours     Taking zyrtec daily         Outpatient Medications Prior to Visit  Medication Sig Dispense Refill  . Accu-Chek FastClix Lancets MISC Use to check blood sugars twice daily. E11.9. 102 each 11  . amLODipine (NORVASC) 5 MG tablet TAKE 1 TABLET BY MOUTH ONCE DAILY 90 tablet 1  . diclofenac  (VOLTAREN) 75 MG EC tablet TAKE 1 TABLET BY MOUTH TWICE A DAY 60 tablet 2  . gabapentin (NEURONTIN) 300 MG capsule Take 300 mg by mouth 3 (three) times daily.   0  . glipiZIDE (GLUCOTROL) 5 MG tablet Take 0.5 tablets (2.5 mg total) by mouth 2 (two) times daily before a meal. 60 tablet 3  . glucose blood test strip Use to check blood sugars twice daily. E11.9. Accu-Chek  Guide Test Strips ( Rx [id number]) 100 each 12  . hydrochlorothiazide (MICROZIDE) 12.5 MG capsule TAKE 1 CAPSULE BY MOUTH ONCE DAILY 90 capsule 1  . metFORMIN (GLUCOPHAGE) 1000 MG tablet TAKE 1 TABLET BY MOUTH 2 TIMES DAILY WITH A MEAL 180 tablet 1  . omeprazole (PRILOSEC) 40 MG capsule TAKE 1 CAPSULE BY MOUTH ONCE DAILY 90 capsule 1  . rosuvastatin (CRESTOR) 20 MG tablet TAKE 1 TABLET BY MOUTH ONCE A DAY 90 tablet 3  . traMADol (ULTRAM) 50 MG tablet TAKE 1 TABLET BY MOUTH EVERY 6 HOURS AS NEEDED 30 tablet 0  . triamcinolone cream (KENALOG) 0.1 % Apply 1 application topically 2 (two) times daily. 30 g 0  . valACYclovir (VALTREX) 500 MG tablet Take 1 tablet (500 mg total) by mouth 3 (three) times daily. 21 tablet 2  . zolpidem (AMBIEN) 10 MG tablet TAKE 1 TABLET BY MOUTH AT BEDTIME AS NEEDED FOR SLEEP 30 tablet 2  . cephALEXin (  KEFLEX) 500 MG capsule Take 1 capsule (500 mg total) by mouth 4 (four) times daily. (Patient not taking: Reported on 09/03/2019) 28 capsule 0   No facility-administered medications prior to visit.     Review of Systems;  Patient denies headache, fevers, malaise, unintentional weight loss, skin rash, eye pain, sinus congestion and sinus pain, sore throat, dysphagia,  hemoptysis , cough, dyspnea, wheezing, chest pain, palpitations, orthopnea, edema, abdominal pain, nausea, melena, diarrhea, constipation, flank pain, dysuria, hematuria, urinary  Frequency, nocturia, numbness, tingling, seizures,  Focal weakness, Loss of consciousness,  Tremor, insomnia, depression, anxiety, and suicidal ideation.      Objective:   BP 122/80 (BP Location: Left Arm, Patient Position: Sitting, Cuff Size: Normal)   Pulse 75   Temp (!) 97.5 F (36.4 C) (Temporal)   Resp 15   Ht 5\' 1"  (1.549 m)   Wt 153 lb 3.2 oz (69.5 kg)   SpO2 98%   BMI 28.95 kg/m   BP Readings from Last 3 Encounters:  09/03/19 122/80  02/17/19 120/78  11/17/18 110/72    Wt Readings from Last 3 Encounters:  09/03/19 153 lb 3.2 oz (69.5 kg)  02/17/19 154 lb 3.2 oz (69.9 kg)  11/17/18 154 lb 12.8 oz (70.2 kg)    General appearance: alert, cooperative and appears stated age Ears: normal TM's and external ear canals both ears Throat: lips, mucosa, and tongue normal; teeth and gums normal Neck: no adenopathy, no carotid bruit, supple, symmetrical, trachea midline and thyroid not enlarged, symmetric, no tenderness/mass/nodules Back: symmetric, no curvature. ROM normal. No CVA tenderness. Lungs: clear to auscultation bilaterally Heart: regular rate and rhythm, S1, S2 normal, no murmur, click, rub or gallop Abdomen: soft, non-tender; bowel sounds normal; no masses,  no organomegaly Pulses: 2+ and symmetrlic Skin: left foot webspace between toes 2 and 3 with bullae formation and erythema.  No abscess or drainage noted.  Lymph nodes: Cervical, supraclavicular, and axillary nodes normal.  Lab Results  Component Value Date   HGBA1C 7.0 (H) 06/07/2019   HGBA1C 7.8 (H) 12/31/2018   HGBA1C 7.3 (A) 09/28/2018    Lab Results  Component Value Date   CREATININE 0.83 06/07/2019   CREATININE 0.84 12/31/2018   CREATININE 0.98 09/28/2018    Lab Results  Component Value Date   WBC 7.3 10/16/2016   HGB 13.1 10/16/2016   HCT 40.0 10/16/2016   PLT 181.0 10/16/2016   GLUCOSE 93 06/07/2019   CHOL 136 06/07/2019   TRIG 171.0 (H) 06/07/2019   HDL 45.70 06/07/2019   LDLDIRECT 71.0 06/02/2017   LDLCALC 56 06/07/2019   ALT 28 06/07/2019   AST 28 06/07/2019   NA 139 06/07/2019   K 4.1 06/07/2019   CL 102 06/07/2019   CREATININE 0.83 06/07/2019    BUN 19 06/07/2019   CO2 29 06/07/2019   TSH 1.57 10/16/2016   HGBA1C 7.0 (H) 06/07/2019   MICROALBUR 2.3 (H) 11/17/2018    US Abdomen Limited Ruq  Result Date: 02/09/2019 CLINICAL DATA:  Elevated liver function studies. History of diabetes and obesity. EXAM: ULTRASOUND ABDOMEN LIMITED RIGHT UPPER QUADRANT COMPARISON:  Abdominal CT 05/03/2006 FINDINGS: Gallbladder: No gallstones or wall thickening visualized. No sonographic Murphy sign noted by sonographer. Common bile duct: Diameter: 6 mm Liver: The hepatic echogenicity is diffusely increased, corresponding with severe steatosis on prior CT. There is focal sparing around the gallbladder, but no focal lesion. Portal vein is patent on color Doppler imaging with normal direction of blood flow towards the liver. IMPRESSION:  1. Echogenic liver corresponding with hepatic steatosis on prior CT. No focal abnormality. 2. The gallbladder and biliary system appear normal. Electronically Signed   By: Richardean Sale M.D.   On: 02/09/2019 13:35    Assessment & Plan:   Problem List Items Addressed This Visit      Unprioritized   Hyperlipidemia due to type 2 diabetes mellitus ([place]) - Primary   Relevant Orders   Lipid panel   Angioedema   Relevant Orders   Ambulatory referral to Allergy   CBC with Differential/Platelet   Complement component c1q   C1 Esterase Inhibitor, Functional   Complement, total   Sedimentation rate   Serum protein electrophoresis with reflex   THYROID NODULE    Checking thyroid function       Diabetes mellitus type 2 in obese [facility])    She has been taking the  Increased metformin dose of 1000 mg bid for management of postprandial hyperglycemia and  2.5 mg glipizide bid  For a1c  > 7.0.   She has microalbuminuria but has a history of allergic reaction to ACE Inhibitor.  Fasting sugars have improved and are generally < 140  Repeat A1cis due next week  Lab Results  Component Value Date   HGBA1C 7.0 (H) 06/07/2019   Lab  Results  Component Value Date   MICROALBUR 2.3 (H) 11/17/2018           Relevant Orders   Hemoglobin A1c   Comprehensive metabolic panel   Cellulitis of second toe of left foot    Secondary to insect bite (tick).  Doxycycline 100 mg bid x 10 days        Other Visit Diagnoses    Thyroid nodule       Relevant Orders   TSH   Need for immunization against influenza       Relevant Orders   Flu Vaccine QUAD High Dose(Fluad) (Completed)   Full body hives       Relevant Orders   Ambulatory referral to Allergy     A total of 25 minutes of face to face time was spent with patient more than half of which was spent in counselling about the above mentioned conditions  and coordination of care  I have discontinued Jessly L. Glendinning's cephALEXin. I am also having her start on doxycycline and montelukast. Additionally, I am having her maintain her valACYclovir, omeprazole, gabapentin, traMADol, glipiZIDE, amLODipine, hydrochlorothiazide, zolpidem, triamcinolone cream, diclofenac, glucose blood, Accu-Chek FastClix Lancets, metFORMIN, and rosuvastatin.  Meds ordered this encounter  Medications  . doxycycline (VIBRA-TABS) 100 MG tablet    Sig: Take 1 tablet (100 mg total) by mouth 2 (two) times daily.    Dispense:  20 tablet    Refill:  0  . EPINEPHrine 0.3 mg/0.3 mL IJ SOAJ injection    Sig: Inject 0.3 mLs (0.3 mg total) into the muscle once for 1 dose.    Dispense:  0.3 mL    Refill:  0  . montelukast (SINGULAIR) 10 MG tablet    Sig: Take 1 tablet (10 mg total) by mouth at bedtime.    Dispense:  30 tablet    Refill:  3    Medications Discontinued During This Encounter  Medication Reason  . cephALEXin (KEFLEX) 500 MG capsule Patient has not taken in last 30 days  . EPINEPHrine 0.3 mg/0.3 mL IJ SOAJ injection Reorder    Follow-up: Return in about 2 months (around 11/03/2019) for follow up diabetes.   Aris Everts  Derrel Nip, MD

## 2019-09-03 NOTE — Patient Instructions (Signed)
For your toe infection:  Warm compresses  Every 6 hours.   Doxycycline 100 mg twice daily WITH FOOD for ten days   For the hives:  Continue taking zyrtec daily.  Adding Singulair once daily today   IF THE HIVES CONTINUE, LET ME KNOW!!!   Return in 2 months for diabetes follow up

## 2019-09-05 DIAGNOSIS — L03032 Cellulitis of left toe: Secondary | ICD-10-CM | POA: Insufficient documentation

## 2019-09-05 NOTE — Assessment & Plan Note (Signed)
Checking thyroid function

## 2019-09-05 NOTE — Assessment & Plan Note (Signed)
Secondary to insect bite (tick).  Doxycycline 100 mg bid x 10 days

## 2019-09-05 NOTE — Assessment & Plan Note (Signed)
She has been taking the  Increased metformin dose of 1000 mg bid for management of postprandial hyperglycemia and  2.5 mg glipizide bid  For a1c  > 7.0.   She has microalbuminuria but has a history of allergic reaction to ACE Inhibitor.  Fasting sugars have improved and are generally < 140  Repeat A1cis due next week  Lab Results  Component Value Date   HGBA1C 7.0 (H) 06/07/2019   Lab Results  Component Value Date   MICROALBUR 2.3 (H) 11/17/2018

## 2019-10-22 IMAGING — MG DIGITAL SCREENING BILATERAL MAMMOGRAM WITH TOMO AND CAD
8 series · 8 of 24 positions shown · non-contrast
Comparison: Previous exam(s).

CLINICAL DATA: Screening.

EXAM:
DIGITAL SCREENING BILATERAL MAMMOGRAM WITH TOMO AND CAD

[L MLO synth-2D]
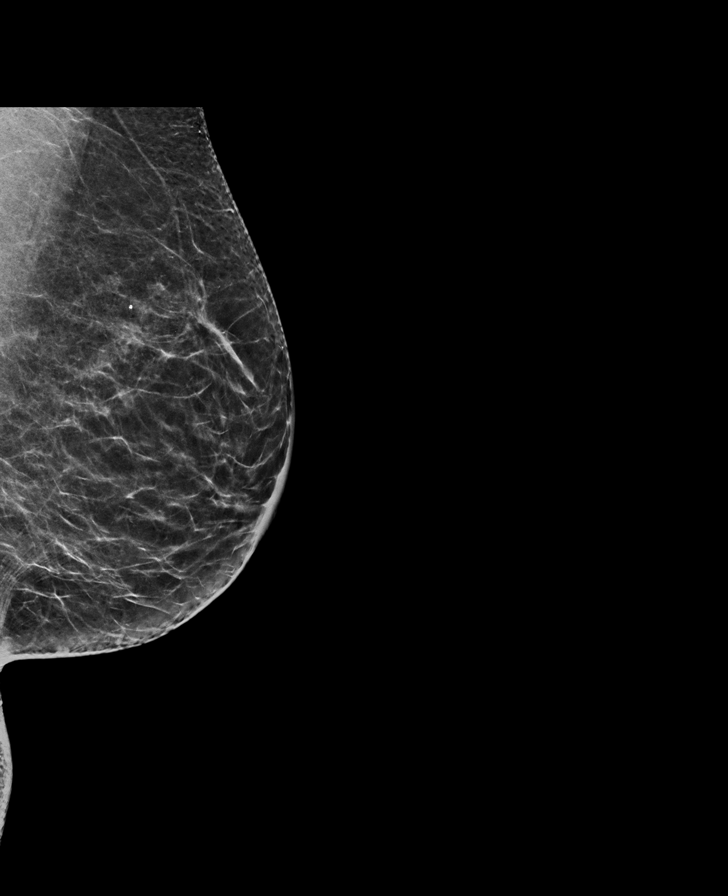

[R CC synth-2D]
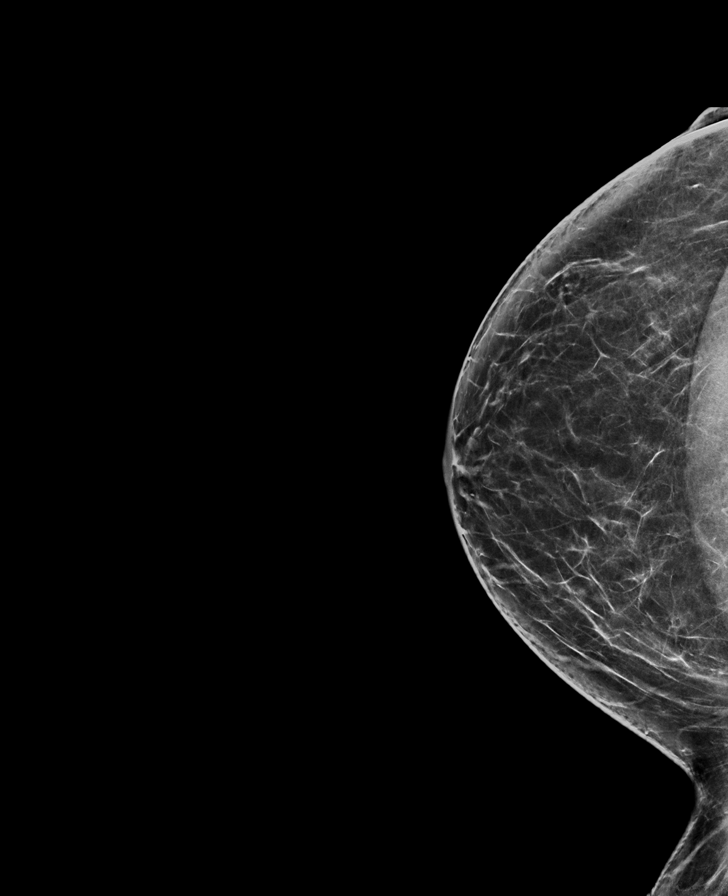

[L CC synth-2D]
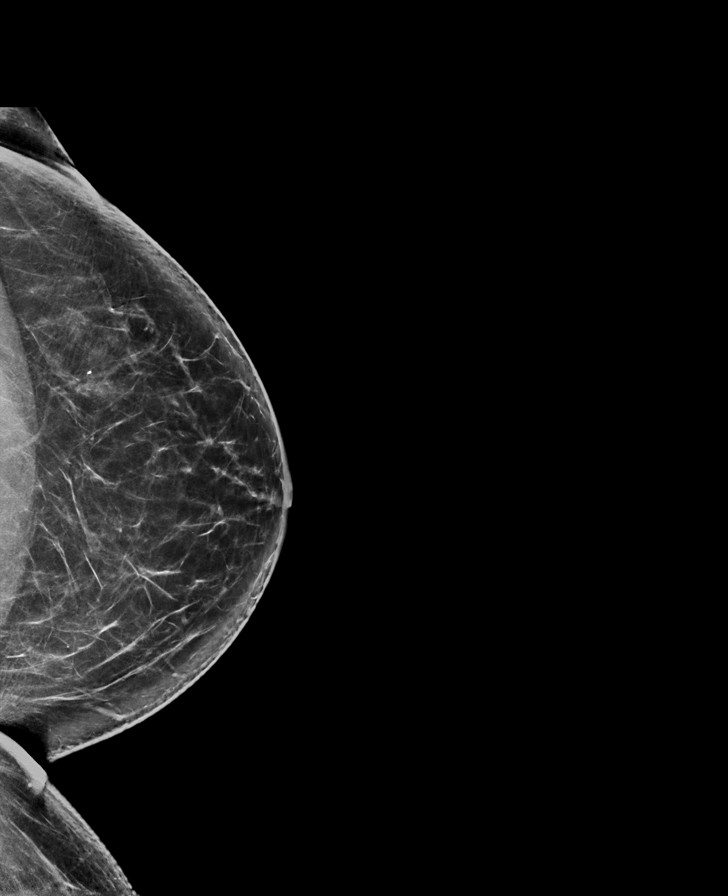

[R MLO synth-2D]
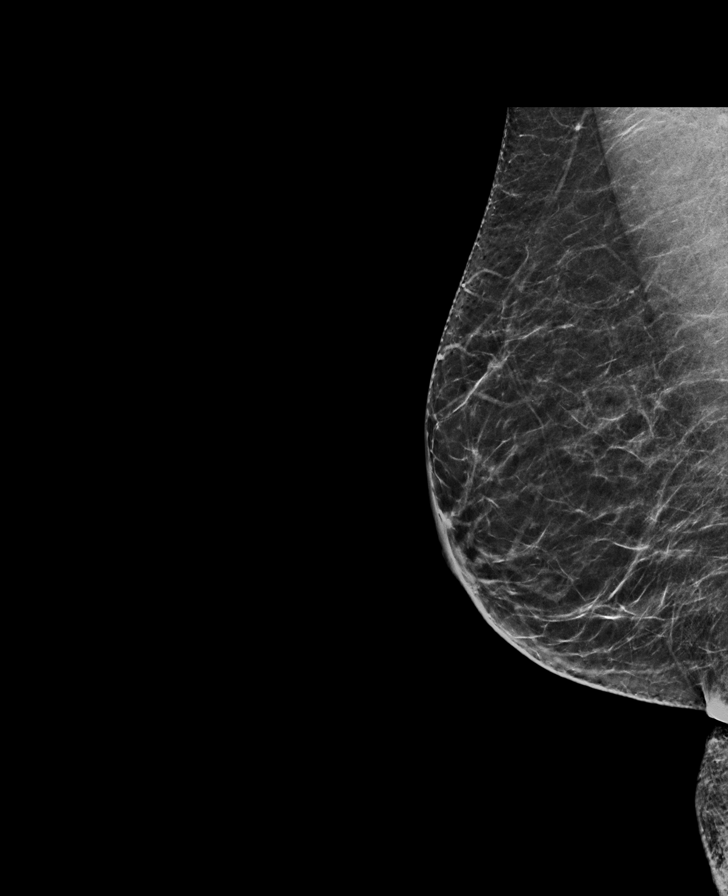

[R MLO tomo · tomo slice 33/65.0]
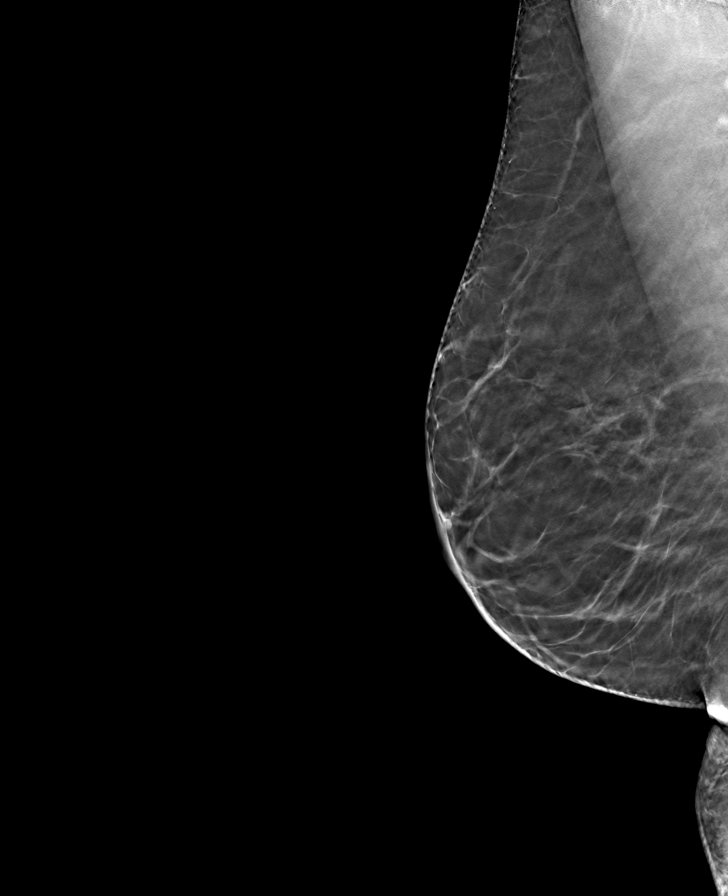

[R CC tomo · tomo slice 37/72.0]
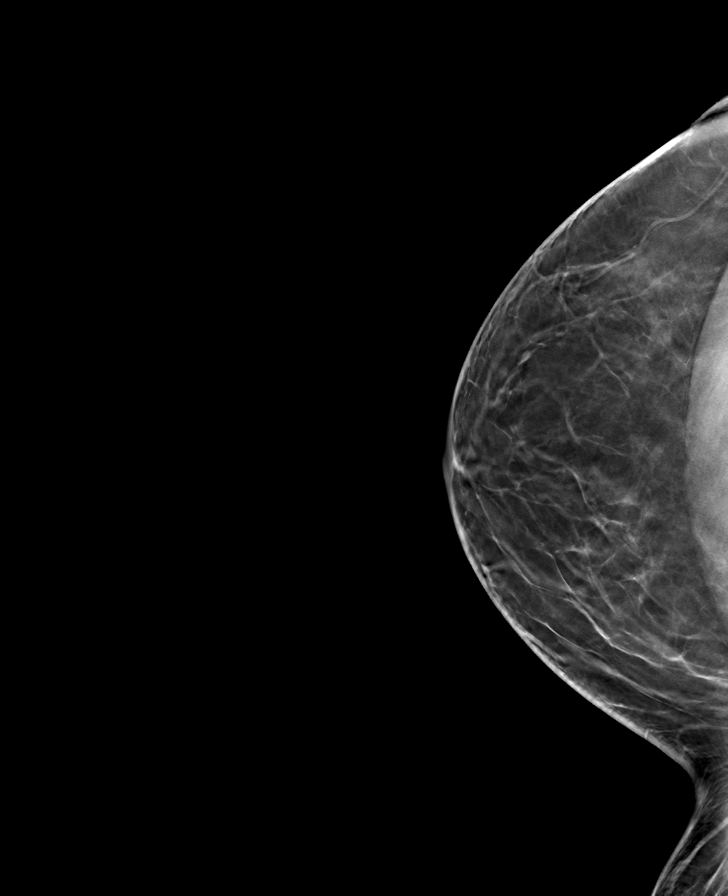

[L CC tomo · tomo slice 37/74.0]
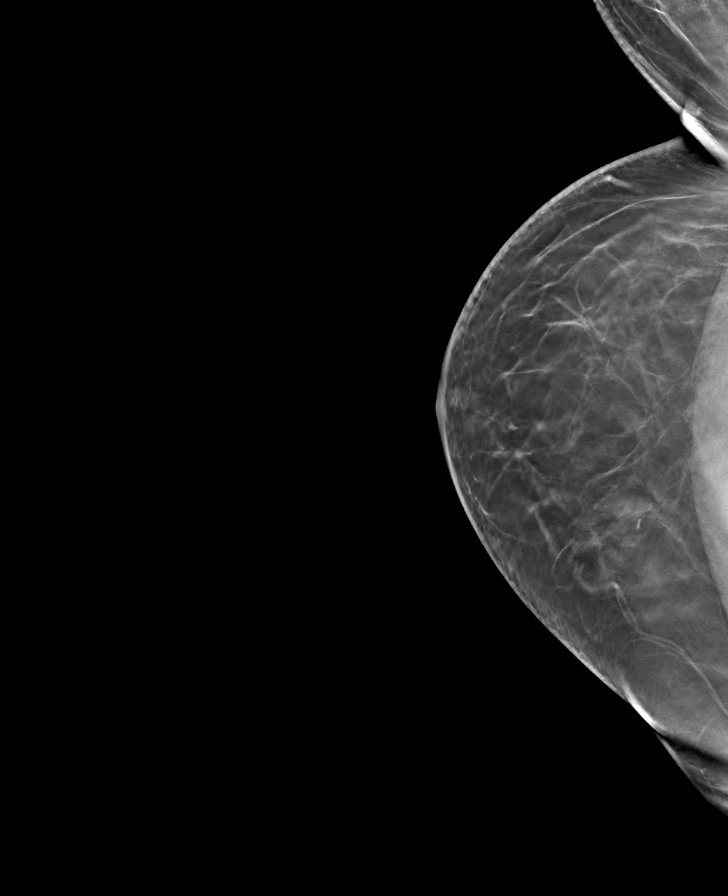

[L MLO tomo · tomo slice 33/66.0]
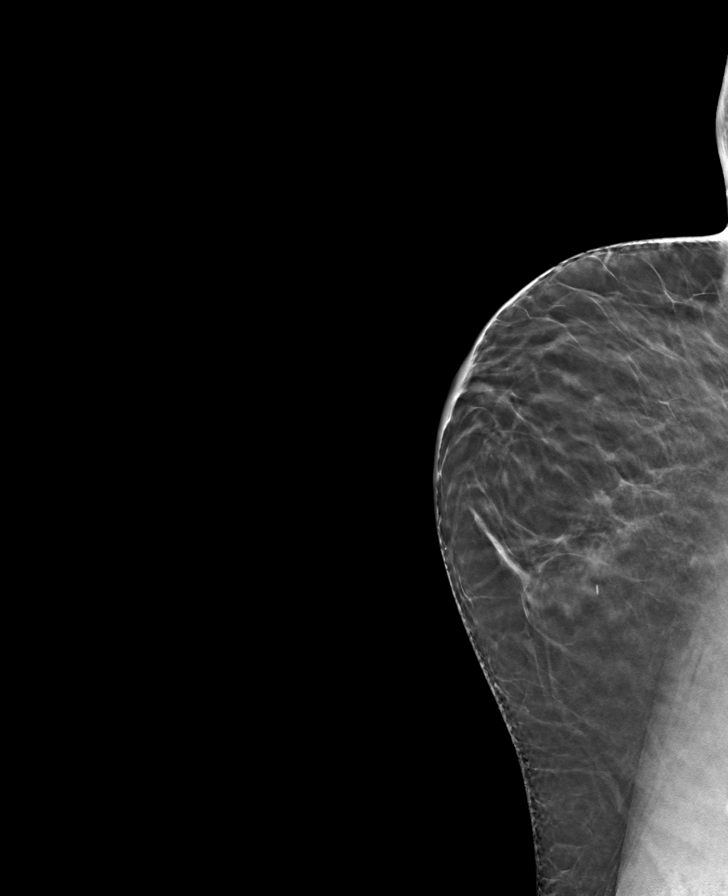

[8 of 24 positions shown; findings below may reference images not displayed]

ACR Breast Density Category b: There are scattered areas of
fibroglandular density.
FINDINGS: There are no findings suspicious for malignancy. Images were
processed with CAD.
IMPRESSION: No mammographic evidence of malignancy. A result letter of this
screening mammogram will be mailed directly to the patient.

RECOMMENDATION:
Screening mammogram in one year. (Code:[id number])

BI-RADS CATEGORY  1: Negative.

## 2019-10-29 ENCOUNTER — Other Ambulatory Visit: Payer: Self-pay | Admitting: Internal Medicine

## 2019-11-18 ENCOUNTER — Other Ambulatory Visit: Payer: Self-pay | Admitting: Internal Medicine

## 2019-11-18 NOTE — Telephone Encounter (Signed)
Refilled: 05/06/2019 Last OV: 09/03/2019 Next OV: not scheduled

## 2019-11-24 ENCOUNTER — Ambulatory Visit (INDEPENDENT_AMBULATORY_CARE_PROVIDER_SITE_OTHER): Payer: Medicare Other

## 2019-11-24 ENCOUNTER — Other Ambulatory Visit: Payer: Self-pay

## 2019-11-24 DIAGNOSIS — Z0000 Encounter for general adult medical examination without abnormal findings: Secondary | ICD-10-CM | POA: Diagnosis not present

## 2019-11-24 NOTE — Progress Notes (Addendum)
Subjective:   Sara Mills is a 68 y.o. female who presents for an Initial Medicare Annual Wellness Visit.  Review of Systems    No ROS.  Medicare Wellness Virtual Visit.  Visual/audio telehealth visit, UTA vital signs.   See social history for additional risk factors.    Cardiac Risk Factors include: advanced age (>27men, >28 women);hypertension;diabetes mellitus     Objective:    Today's Vitals   There is no height or weight on file to calculate BMI.  Advanced Directives 11/24/2019  Does Patient Have a Medical Advance Directive? No  Would patient like information on creating a medical advance directive? Yes (MAU/Ambulatory/Procedural Areas - Information given)    Current Medications (verified) Outpatient Encounter Medications as of 11/24/2019  Medication Sig  . Accu-Chek FastClix Lancets MISC Use to check blood sugars twice daily. E11.9.  [name] amLODipine (NORVASC) 5 MG tablet TAKE 1 TABLET BY MOUTH ONCE DAILY  . diclofenac (VOLTAREN) 75 MG EC tablet TAKE 1 TABLET BY MOUTH TWICE (2) DAILY  . doxycycline (VIBRA-TABS) 100 MG tablet Take 1 tablet (100 mg total) by mouth 2 (two) times daily.  [name] gabapentin (NEURONTIN) 300 MG capsule Take 300 mg by mouth 3 (three) times daily.   [name] glipiZIDE (GLUCOTROL) 5 MG tablet Take 0.5 tablets (2.5 mg total) by mouth 2 (two) times daily before a meal.  . glucose blood test strip Use to check blood sugars twice daily. E11.9. Accu-Chek  Guide Test Strips ( Rx [id number])  . hydrochlorothiazide (MICROZIDE) 12.5 MG capsule TAKE 1 CAPSULE BY MOUTH ONCE DAILY  . metFORMIN (GLUCOPHAGE) 1000 MG tablet TAKE 1 TABLET BY MOUTH 2 TIMES DAILY WITH A MEAL  . montelukast (SINGULAIR) 10 MG tablet Take 1 tablet (10 mg total) by mouth at bedtime.  [name] omeprazole (PRILOSEC) 40 MG capsule TAKE 1 CAPSULE BY MOUTH ONCE DAILY  . rosuvastatin (CRESTOR) 20 MG tablet TAKE 1 TABLET BY MOUTH ONCE A DAY  . traMADol (ULTRAM) 50 MG tablet TAKE 1 TABLET BY MOUTH EVERY 6 HOURS  AS NEEDED  . triamcinolone cream (KENALOG) 0.1 % Apply 1 application topically 2 (two) times daily.  . valACYclovir (VALTREX) 500 MG tablet Take 1 tablet (500 mg total) by mouth 3 (three) times daily.  [name] zolpidem (AMBIEN) 10 MG tablet TAKE 1 TABLET BY MOUTH AT BEDTIME AS NEEDED FOR SLEEP   No facility-administered encounter medications on file as of 11/24/2019.    Allergies (verified) Lisinopril   History: Past Medical History:  Diagnosis Date  . Diabetes mellitus   . GERD (gastroesophageal reflux disease)   . Hyperlipidemia   . Hypertension    Past Surgical History:  Procedure Laterality Date  . ABDOMINAL HYSTERECTOMY  1995    and BSO  . APPENDECTOMY    . BILATERAL OOPHORECTOMY    . BUNIONECTOMY    . CARPAL TUNNEL RELEASE Right   . CESAREAN SECTION  1977 and 1978   Family History  Problem Relation Age of Onset  . Stroke Mother   . Heart murmur Mother   . Mitral valve prolapse Mother   . Cancer Father 11       carcinoid syndrome  . Stomach cancer Father   . Breast cancer Neg Hx    Social History   Socioeconomic History  . Marital status: Married    Spouse name: Not on file  . Number of children: Not on file  . Years of education: Not on file  . Highest education level: Not on file  Occupational History  . Not on file  Tobacco Use  . Smoking status: Never Smoker  . Smokeless tobacco: Never Used  Substance and Sexual Activity  . Alcohol use: No  . Drug use: No  . Sexual activity: Not on file  Other Topics Concern  . Not on file  Social History Narrative  . Not on file   Social Determinants of Health   Financial Resource Strain:   . Difficulty of Paying Living Expenses: Not on file  Food Insecurity:   . Worried About Charity fundraiser in the Last Year: Not on file  . Ran Out of Food in the Last Year: Not on file  Transportation Needs:   . Lack of Transportation (Medical): Not on file  . Lack of Transportation (Non-Medical): Not on file  Physical  Activity:   . Days of Exercise per Week: Not on file  . Minutes of Exercise per Session: Not on file  Stress:   . Feeling of Stress : Not on file  Social Connections:   . Frequency of Communication with Friends and Family: Not on file  . Frequency of Social Gatherings with Friends and Family: Not on file  . Attends Religious Services: Not on file  . Active Member of Clubs or Organizations: Not on file  . Attends Archivist Meetings: Not on file  . Marital Status: Not on file    Tobacco Counseling Counseling given: Not Answered   Clinical Intake:  Pre-visit preparation completed: Yes        Diabetes: Yes(Follow up with pcp)  How often do you need to have someone help you when you read instructions, pamphlets, or other written materials from your doctor or pharmacy?: 1 - Never  Interpreter Needed?: No      Activities of Daily Living In your present state of health, do you have any difficulty performing the following activities: 11/24/2019  Hearing? N  Vision? N  Difficulty concentrating or making decisions? N  Walking or climbing stairs? N  Dressing or bathing? N  Doing errands, shopping? N  Preparing Food and eating ? N  Using the Toilet? N  In the past six months, have you accidently leaked urine? N  Do you have problems with loss of bowel control? N  Managing your Medications? N  Managing your Finances? N  Housekeeping or managing your Housekeeping? N  Some recent data might be hidden     Immunizations and Health Maintenance Immunization History  Administered Date(s) Administered  . Fluad Quad(high Dose 65+) 09/03/2019  . Hep A / Hep B 07/13/2019, 09/06/2019  . Influenza Split 10/20/2013  . Influenza, High Dose Seasonal PF 09/28/2018  . Influenza,inj,Quad PF,6+ Mos 08/12/2014, 09/21/2016, 09/08/2017  . Influenza-Unspecified 09/08/2012, 09/22/2015  . Pneumococcal Conjugate-13 08/12/2014  . Pneumococcal Polysaccharide-23 09/15/2015  . Td  05/01/1998  . Tdap 01/10/2014  . Zoster Recombinat (Shingrix) 12/21/2018, 02/22/2019   Health Maintenance Due  Topic Date Due  . FOOT EXAM  09/29/2019  . URINE MICROALBUMIN  11/18/2019    Patient Care Team: Crecencio Mc, MD as PCP - General (Internal Medicine)  Indicate any recent Medical Services you may have received from other than [facility] providers in the past year (date may be approximate).     Assessment:   This is a routine wellness examination for Tiffannie.  Nurse connected with patient 11/24/19 at 11:00 AM EST by a telephone enabled telemedicine application and verified that I am speaking with the correct person using two  identifiers. Patient stated full name and DOB. Patient gave permission to continue with virtual visit. Patient's location was at home and Nurse's location was at [place] office.   Patient is alert and oriented x3. Patient notes she has some difficulty focusing or concentrating and remembering new information.  Patient likes to play computer games for brain stimulation.   Health Maintenance Due: -Foot Exam- follow by pcp -Hgb A1c- 06/07/19 (7.0) See completed HM at the end of note.   Eye: Visual acuity not assessed. Virtual visit. Followed by their ophthalmologist. Retinopathy- none reported.  Dental: Visits every 6 months.    Hearing: Demonstrates normal hearing during visit.  Safety:  Patient feels safe at home- yes Patient does have smoke detectors at home- yes Patient does wear sunscreen or protective clothing when in direct sunlight - yes Patient does wear seat belt when in a moving vehicle - yes Patient drives- yes Adequate lighting in walkways free from debris- yes Grab bars and handrails used as appropriate- yes Ambulates with an assistive device- no Cell phone on person when ambulating outside of the home- yes  Social: Alcohol intake - no    Smoking history- never   Smokers in home? none Illicit drug use?  none  Medication: Taking as directed and without issues.  Pill box in use -yes  Self managed - yes   Covid-19: Precautions and sickness symptoms discussed. Wears mask, social distancing, hand hygiene as appropriate.   Activities of Daily Living Patient denies needing assistance with: household chores, feeding themselves, getting from bed to chair, getting to the toilet, bathing/showering, dressing, managing money, or preparing meals.   Discussed the importance of a healthy diet, water intake and the benefits of aerobic exercise.   Physical activity- no routine. Encouraged to stay active.   Diet:  Regular Water: 2 cups Caffeine: 1 cup of tea/pepsi Encouraged to drink more water/fuids to stay hydrated.   Other Providers Patient Care Team: Crecencio Mc, MD as PCP - General (Internal Medicine)  Hearing/Vision screen  Hearing Screening   125Hz  250Hz  500Hz  1000Hz  2000Hz  3000Hz  4000Hz  6000Hz  8000Hz   Right ear:           Left ear:           Comments: Patient is able to hear conversational tones without difficulty.  No issues reported.  Vision Screening Comments: Wears corrective lenses Visual acuity not assessed, virtual visit.  They have seen their ophthalmologist in the last 12 months.     Dietary issues and exercise activities discussed: Current Exercise Habits: Home exercise routine, Intensity: Mild  Goals      Patient Stated   . Increase physical activity (pt-stated)     Use stationary bike for exercise      Other   . Low carb diet      Depression Screen PHQ 2/9 Scores 11/24/2019 02/17/2019 12/17/2017 12/04/2016 01/10/2014  PHQ - 2 Score 0 0 0 0 0  PHQ- 9 Score - 3 9 - -    Fall Risk Fall Risk  11/24/2019 09/03/2019 12/04/2016  Falls in the past year? 0 0 No  Follow up Falls prevention discussed Falls evaluation completed -   Timed Get Up and Go Performed no, virtual visit  Cognitive Function:     6CIT Screen 11/24/2019  What Year? 0 points  What month?  0 points  What time? 0 points  Count back from 20 0 points  Months in reverse 0 points  Repeat phrase 0 points  Total Score  0    Screening Tests Health Maintenance  Topic Date Due  . FOOT EXAM  09/29/2019  . URINE MICROALBUMIN  11/18/2019  . HEMOGLOBIN A1C  12/07/2019  . OPHTHALMOLOGY EXAM  12/26/2019  . MAMMOGRAM  01/20/2020  . PNA vac Low Risk Adult (2 of 2 - PPSV23) 09/14/2020  . COLONOSCOPY  03/20/2021  . TETANUS/TDAP  01/11/2024  . INFLUENZA VACCINE  Completed  . DEXA SCAN  Completed  . Hepatitis C Screening  Completed     Plan:   Keep all routine maintenance appointments.   Next scheduled fasting lab 11/26/19 @ 9:15  Medicare Attestation I have personally reviewed: The patient's medical and social history Their use of alcohol, tobacco or illicit drugs Their current medications and supplements The patient's functional ability including ADLs,fall risks, home safety risks, cognitive, and hearing and visual impairment Diet and physical activities Evidence for depression   I have reviewed and discussed with patient certain preventive protocols, quality metrics, and best practice recommendations.     OBrien-Blaney, Petros Ahart L, LPN   [id number]    I have reviewed the above information and agree with above.   Deborra Medina, MD

## 2019-11-24 NOTE — Patient Instructions (Addendum)
  Sara Mills , Thank you for taking time to come for your Medicare Wellness Visit. I appreciate your ongoing commitment to your health goals. Please review the following plan we discussed and let me know if I can assist you in the future.   These are the goals we discussed: Goals      Patient Stated   . Increase physical activity (pt-stated)     Use stationary bike for exercise      Other   . Low carb diet       This is a list of the screening recommended for you and due dates:  Health Maintenance  Topic Date Due  . Complete foot exam   09/29/2019  . Urine Protein Check  11/18/2019  . Hemoglobin A1C  12/07/2019  . Eye exam for diabetics  12/26/2019  . Mammogram  01/20/2020  . Pneumonia vaccines (2 of 2 - PPSV23) 09/14/2020  . Colon Cancer Screening  03/20/2021  . Tetanus Vaccine  01/11/2024  . Flu Shot  Completed  . DEXA scan (bone density measurement)  Completed  .  Hepatitis C: One time screening is recommended by Center for Disease Control  (CDC) for  adults born from [birth year] through 1963.   Completed

## 2019-11-26 ENCOUNTER — Other Ambulatory Visit (INDEPENDENT_AMBULATORY_CARE_PROVIDER_SITE_OTHER): Payer: Medicare Other

## 2019-11-26 ENCOUNTER — Other Ambulatory Visit: Payer: Self-pay

## 2019-11-26 ENCOUNTER — Other Ambulatory Visit: Payer: Self-pay | Admitting: Internal Medicine

## 2019-11-26 DIAGNOSIS — E669 Obesity, unspecified: Secondary | ICD-10-CM

## 2019-11-26 DIAGNOSIS — T783XXA Angioneurotic edema, initial encounter: Secondary | ICD-10-CM

## 2019-11-26 DIAGNOSIS — E785 Hyperlipidemia, unspecified: Secondary | ICD-10-CM

## 2019-11-26 DIAGNOSIS — E041 Nontoxic single thyroid nodule: Secondary | ICD-10-CM | POA: Diagnosis not present

## 2019-11-26 DIAGNOSIS — E1169 Type 2 diabetes mellitus with other specified complication: Secondary | ICD-10-CM

## 2019-11-26 LAB — COMPREHENSIVE METABOLIC PANEL
ALT: 33 U/L (ref 0–35)
AST: 29 U/L (ref 0–37)
Albumin: 4.6 g/dL (ref 3.5–5.2)
Alkaline Phosphatase: 82 U/L (ref 39–117)
BUN: 21 mg/dL (ref 6–23)
CO2: 31 mEq/L (ref 19–32)
Calcium: 9.4 mg/dL (ref 8.4–10.5)
Chloride: 101 mEq/L (ref 96–112)
Creatinine, Ser: 0.83 mg/dL (ref 0.40–1.20)
GFR: 68.71 mL/min (ref >60.00)
Glucose, Bld: 117 mg/dL — ABNORMAL HIGH (ref 70–99)
Potassium: 3.8 mEq/L (ref 3.5–5.1)
Sodium: 140 mEq/L (ref 135–145)
Total Bilirubin: 0.6 mg/dL (ref 0.2–1.2)
Total Protein: 7.2 g/dL (ref 6.0–8.3)

## 2019-11-26 LAB — TSH: TSH: 2.42 u[IU]/mL (ref 0.35–4.50)

## 2019-11-26 LAB — CBC WITH DIFFERENTIAL/PLATELET
Basophils Absolute: 0.1 10*3/uL (ref 0.0–0.1)
Basophils Relative: 1.1 % (ref 0.0–3.0)
Eosinophils Absolute: 0.2 10*3/uL (ref 0.0–0.7)
Eosinophils Relative: 4.5 % (ref 0.0–5.0)
HCT: 37 % (ref 36.0–46.0)
Hemoglobin: 12.2 g/dL (ref 12.0–15.0)
Lymphocytes Relative: 32 % (ref 12.0–46.0)
Lymphs Abs: 1.7 10*3/uL (ref 0.7–4.0)
MCHC: 33 g/dL (ref 30.0–36.0)
MCV: 87.8 fl (ref 78.0–100.0)
Monocytes Absolute: 0.5 10*3/uL (ref 0.1–1.0)
Monocytes Relative: 9.1 % (ref 3.0–12.0)
Neutro Abs: 2.8 10*3/uL (ref 1.4–7.7)
Neutrophils Relative %: 53.3 % (ref 43.0–77.0)
Platelets: 123 10*3/uL — ABNORMAL LOW (ref 150.0–400.0)
RBC: 4.22 Mil/uL (ref 3.87–5.11)
RDW: 15.1 % (ref 11.5–15.5)
WBC: 5.2 10*3/uL (ref 4.0–10.5)

## 2019-11-26 LAB — LIPID PANEL
Cholesterol: 127 mg/dL (ref 0–200)
HDL: 39.4 mg/dL (ref >39.00)
LDL Cholesterol: 60 mg/dL (ref 0–99)
NonHDL: 87.79
Total CHOL/HDL Ratio: 3
Triglycerides: 138 mg/dL (ref 0.0–149.0)
VLDL: 27.6 mg/dL (ref 0.0–40.0)

## 2019-11-26 LAB — SEDIMENTATION RATE: Sed Rate: 4 mm/hr (ref 0–30)

## 2019-11-26 LAB — HEMOGLOBIN A1C: Hgb A1c MFr Bld: 6.9 % — ABNORMAL HIGH (ref 4.6–6.5)

## 2019-12-01 LAB — COMPLEMENT, TOTAL: Compl, Total (CH50): >60 U/mL — ABNORMAL HIGH (ref 31–60)

## 2019-12-01 LAB — PROTEIN ELECTROPHORESIS, SERUM, WITH REFLEX
Albumin ELP: 4.4 g/dL (ref 3.8–4.8)
Alpha 1: 0.3 g/dL (ref 0.2–0.3)
Alpha 2: 0.8 g/dL (ref 0.5–0.9)
Beta 2: 0.4 g/dL (ref 0.2–0.5)
Beta Globulin: 0.5 g/dL (ref 0.4–0.6)
Gamma Globulin: 1.1 g/dL (ref 0.8–1.7)
Total Protein: 7.4 g/dL (ref 6.1–8.1)

## 2019-12-01 LAB — C1 ESTERASE INHIBITOR, FUNCTIONAL: C1 Esterase Inhibitor Funct: 89 % (ref >=68)

## 2019-12-01 LAB — COMPLEMENT COMPONENT C1Q: Complement C1Q: 6.4 mg/dL (ref 5.0–8.6)

## 2019-12-23 DIAGNOSIS — D485 Neoplasm of uncertain behavior of skin: Secondary | ICD-10-CM | POA: Diagnosis not present

## 2019-12-23 DIAGNOSIS — L821 Other seborrheic keratosis: Secondary | ICD-10-CM | POA: Diagnosis not present

## 2019-12-23 DIAGNOSIS — D2239 Melanocytic nevi of other parts of face: Secondary | ICD-10-CM | POA: Diagnosis not present

## 2019-12-23 DIAGNOSIS — D2272 Melanocytic nevi of left lower limb, including hip: Secondary | ICD-10-CM | POA: Diagnosis not present

## 2019-12-23 DIAGNOSIS — D2372 Other benign neoplasm of skin of left lower limb, including hip: Secondary | ICD-10-CM | POA: Diagnosis not present

## 2019-12-23 DIAGNOSIS — L814 Other melanin hyperpigmentation: Secondary | ICD-10-CM | POA: Diagnosis not present

## 2019-12-23 DIAGNOSIS — D225 Melanocytic nevi of trunk: Secondary | ICD-10-CM | POA: Diagnosis not present

## 2019-12-23 DIAGNOSIS — D1801 Hemangioma of skin and subcutaneous tissue: Secondary | ICD-10-CM | POA: Diagnosis not present

## 2019-12-23 DIAGNOSIS — Z85828 Personal history of other malignant neoplasm of skin: Secondary | ICD-10-CM | POA: Diagnosis not present

## 2020-01-10 LAB — HM DIABETES EYE EXAM

## 2020-01-27 ENCOUNTER — Other Ambulatory Visit: Payer: Self-pay | Admitting: Internal Medicine

## 2020-01-28 ENCOUNTER — Other Ambulatory Visit: Payer: Self-pay | Admitting: Internal Medicine

## 2020-02-07 DIAGNOSIS — D369 Benign neoplasm, unspecified site: Secondary | ICD-10-CM | POA: Diagnosis not present

## 2020-02-07 DIAGNOSIS — H2513 Age-related nuclear cataract, bilateral: Secondary | ICD-10-CM | POA: Diagnosis not present

## 2020-02-07 DIAGNOSIS — E119 Type 2 diabetes mellitus without complications: Secondary | ICD-10-CM | POA: Diagnosis not present

## 2020-02-08 ENCOUNTER — Other Ambulatory Visit: Payer: Self-pay | Admitting: Internal Medicine

## 2020-03-10 ENCOUNTER — Other Ambulatory Visit: Payer: Self-pay | Admitting: Internal Medicine

## 2020-03-28 ENCOUNTER — Other Ambulatory Visit: Payer: Self-pay | Admitting: Internal Medicine

## 2020-04-27 ENCOUNTER — Other Ambulatory Visit: Payer: Self-pay

## 2020-04-27 MED ORDER — METFORMIN HCL 1000 MG PO TABS: 1000.0000 mg | ORAL_TABLET | Freq: Two times a day (BID) | ORAL | 0 refills | Status: DC

## 2020-05-06 ENCOUNTER — Telehealth: Payer: Self-pay | Admitting: Internal Medicine

## 2020-05-06 DIAGNOSIS — R748 Abnormal levels of other serum enzymes: Secondary | ICD-10-CM

## 2020-05-06 DIAGNOSIS — E669 Obesity, unspecified: Secondary | ICD-10-CM

## 2020-05-06 DIAGNOSIS — E1169 Type 2 diabetes mellitus with other specified complication: Secondary | ICD-10-CM

## 2020-05-09 NOTE — Telephone Encounter (Signed)
Refill denied until she has been seen.  She is overdue for follow up .  Fasting labs ordered , need to be done  Prior to visit

## 2020-05-10 NOTE — Telephone Encounter (Signed)
LMTCB. Need to schedule pt for a follow up appt and a fasting lab appt.

## 2020-05-11 NOTE — Telephone Encounter (Signed)
LMTCB

## 2020-05-11 NOTE — Telephone Encounter (Signed)
Labs and medication follow up has been scheduled.

## 2020-05-11 NOTE — Telephone Encounter (Signed)
noted 

## 2020-05-18 ENCOUNTER — Other Ambulatory Visit: Payer: Self-pay | Admitting: Internal Medicine

## 2020-05-19 ENCOUNTER — Other Ambulatory Visit: Payer: Self-pay

## 2020-05-19 ENCOUNTER — Encounter: Payer: Self-pay | Admitting: Nurse Practitioner

## 2020-05-19 ENCOUNTER — Telehealth (INDEPENDENT_AMBULATORY_CARE_PROVIDER_SITE_OTHER): Payer: Medicare Other | Admitting: Nurse Practitioner

## 2020-05-19 VITALS — Temp 97.4°F | Ht 61.0 in | Wt 148.0 lb

## 2020-05-19 DIAGNOSIS — J019 Acute sinusitis, unspecified: Secondary | ICD-10-CM | POA: Diagnosis not present

## 2020-05-19 DIAGNOSIS — J069 Acute upper respiratory infection, unspecified: Secondary | ICD-10-CM | POA: Diagnosis not present

## 2020-05-19 DIAGNOSIS — R058 Other specified cough: Secondary | ICD-10-CM | POA: Insufficient documentation

## 2020-05-19 MED ORDER — DEXTROMETHORPHAN-GUAIFENESIN 5-100 MG/5ML PO LIQD
10.0000 mL | Freq: Every evening | ORAL | 0 refills | Status: DC | PRN
Start: 2020-05-19 — End: 2020-10-04

## 2020-05-19 MED ORDER — BENZONATATE 100 MG PO CAPS: 100.0000 mg | ORAL_CAPSULE | Freq: Three times a day (TID) | ORAL | 0 refills | Status: AC | PRN

## 2020-05-19 MED ORDER — AMOXICILLIN-POT CLAVULANATE 875-125 MG PO TABS: 1.0000 | ORAL_TABLET | Freq: Two times a day (BID) | ORAL | 0 refills | Status: AC

## 2020-05-19 NOTE — Progress Notes (Signed)
Virtual Visit via Video Note  This visit type was conducted due to national recommendations for restrictions regarding the COVID-19 pandemic (e.g. social distancing).  This format is felt to be most appropriate for this patient at this time.  All issues noted in this document were discussed and addressed.  No physical exam was performed (except for noted visual exam findings with Video Visits).   I connected with@ on 05/19/20 at  4:30 PM EDT by a video enabled telemedicine application or telephone and verified that I am speaking with the correct person using two identifiers. Location patient: home Location provider: work  Persons participating in the virtual visit: patient, provider  I discussed the limitations, risks, security and privacy concerns of performing an evaluation and management service by telephone and the availability of in person appointments. I also discussed with the patient that there may be a patient responsible charge related to this service. The patient expressed understanding and agreed to proceed.  Reason for visit: head congestion and cough  HPI: 69 yo with history of diabetes developed cough started last Wed 05/10/20. She thought she had acid reflux.  On the first day she just coughed and then developed nasal congestion at night, and seemed to clear up initially during the day.  But as the days progressed, she developed more of a bad cough and  head congestion as well as chest congestion. Sputum is yellow. No SOB, wheezing, chest tightness. No fever or chills. No loss of  taste or smell.  No flu like body aches, but her upper chest hurts from coughing. No diarrhea. No rash. She has been taking  DayQuil during the day and NyQuil at night every day since last Thursday.  She is using a generic Robitussin cough syrup.   Patient hosted a huge family party for her granddaughter's graduation over the weekend had multiple people from out of state visiting. She had no chance to lay down  with the whole house full of people.  She has taken herself to bed Tuesday through yesterday.  She is feeling a little better with the extra rest. She is blowing bloody stuff from her nose and is very congested with maxillary pressure.  She has had no Covid exposure that she is aware.  She did receive the [place] vaccine on 01/01/2020 and 01/22/2020.  ROS: See pertinent positives and negatives per HPI.  Past Medical History:  Diagnosis Date  . Diabetes mellitus   . GERD (gastroesophageal reflux disease)   . Hyperlipidemia   . Hypertension     Past Surgical History:  Procedure Laterality Date  . ABDOMINAL HYSTERECTOMY  1995    and BSO  . APPENDECTOMY    . BILATERAL OOPHORECTOMY    . BUNIONECTOMY    . CARPAL TUNNEL RELEASE Right   . CESAREAN SECTION  1977 and 1978    Family History  Problem Relation Age of Onset  . Stroke Mother   . Heart murmur Mother   . Mitral valve prolapse Mother   . Cancer Father 67       carcinoid syndrome  . Stomach cancer Father   . Breast cancer Neg Hx     SOCIAL HX:non smoker   Current Outpatient Medications:  .  Accu-Chek FastClix Lancets MISC, Use to check blood sugars twice daily. E11.9., Disp: 102 each, Rfl: 11 .  amLODipine (NORVASC) 5 MG tablet, TAKE 1 TABLET BY MOUTH ONCE DAILY, Disp: 90 tablet, Rfl: 1 .  diclofenac (VOLTAREN) 75 MG EC tablet, TAKE  1 TABLET BY MOUTH TWICE DAILY, Disp: 60 tablet, Rfl: 2 .  gabapentin (NEURONTIN) 300 MG capsule, Take 300 mg by mouth 3 (three) times daily. , Disp: , Rfl: 0 .  glipiZIDE (GLUCOTROL) 5 MG tablet, TAKE 1/2 TABLET (2.5 MG TOTAL) BY MOUTH TWICE DAILY BEFORE A MEAL, Disp: 60 tablet, Rfl: 3 .  glucose blood test strip, Use to check blood sugars twice daily. E11.9. Accu-Chek  Guide Test Strips ( Rx [id number]), Disp: 100 each, Rfl: 12 .  hydrochlorothiazide (MICROZIDE) 12.5 MG capsule, TAKE 1 CAPSULE BY MOUTH ONCE DAILY, Disp: 90 capsule, Rfl: 1 .  metFORMIN (GLUCOPHAGE) 1000 MG tablet, Take 1 tablet (1,000  mg total) by mouth 2 (two) times daily with a meal., Disp: 180 tablet, Rfl: 0 .  montelukast (SINGULAIR) 10 MG tablet, TAKE 1 TABLET BY MOUTH AT BEDTIME, Disp: 90 tablet, Rfl: 1 .  omeprazole (PRILOSEC) 40 MG capsule, TAKE 1 CAPSULE BY MOUTH ONCE DAILY, Disp: 90 capsule, Rfl: 1 .  rosuvastatin (CRESTOR) 20 MG tablet, TAKE 1 TABLET BY MOUTH ONCE A DAY, Disp: 90 tablet, Rfl: 3 .  traMADol (ULTRAM) 50 MG tablet, TAKE 1 TABLET BY MOUTH EVERY 6 HOURS AS NEEDED, Disp: 30 tablet, Rfl: 0 .  triamcinolone cream (KENALOG) 0.1 %, Apply 1 application topically 2 (two) times daily., Disp: 30 g, Rfl: 0 .  valACYclovir (VALTREX) 500 MG tablet, Take 1 tablet (500 mg total) by mouth 3 (three) times daily., Disp: 21 tablet, Rfl: 2 .  zolpidem (AMBIEN) 10 MG tablet, TAKE 1 TABLET BY MOUTH AT BEDTIME AS NEEDED FOR SLEEP, Disp: 30 tablet, Rfl: 2 .  amoxicillin-clavulanate (AUGMENTIN) 875-125 MG tablet, Take 1 tablet by mouth 2 (two) times daily for 7 days., Disp: 14 tablet, Rfl: 0 .  benzonatate (TESSALON PERLES) 100 MG capsule, Take 1 capsule (100 mg total) by mouth 3 (three) times daily as needed for up to 7 days for cough., Disp: 21 capsule, Rfl: 0 .  Dextromethorphan-guaiFENesin 5-100 MG/5ML LIQD, Take 10 mLs by mouth at bedtime as needed (for cough)., Disp: 118 mL, Rfl: 0  EXAM:  VITALS per patient if applicable:  GENERAL: alert, oriented, appears fatigued with nasal congested tone to her voice.  HEENT: atraumatic, conjunctiva clear, no obvious abnormalities on inspection of external nose and ears  NECK: normal movements of the head and neck  LUNGS: on inspection no signs of respiratory distress, breathing rate appears normal, no obvious gross SOB. She coughed once during the interview and it is not deep or congested sounding. No audible wheezing.   CV: no obvious cyanosis  MS: moves all visible extremities without noticeable abnormality  PSYCH/NEURO: pleasant and cooperative, no obvious depression or  anxiety, speech and thought processing grossly intact  ASSESSMENT AND PLAN:  Discussed the following assessment and plan:  Acute non-recurrent sinusitis, unspecified location  Cough with sputum  Patient reports her symptoms started with a cough, then developed nasal congestion.  She has been coughing up yellow phlegm, and now she is blowing out yellow discharge with blood-tinged.  She has marked nasal congestion at this time with maxillary pressure.  I suspect she started with an upper respiratory infection that has turned into a bacterial sinus infection at this time.  She has had symptoms over 1 week.  She reports  no fevers or chills.  She has had fatigue, but has had a lot of company and has worked herself every day.  She has been resting for the last couple days and feels  a little better.   She does need to get Covid tested and we talked about this today.  She is agreeable.  She has had both her vaccines and that is encouraging.  Patient does not think she needs to have any steroids at this time.  She says she is diabetic, she is not tight or wheezing.  He does not have COPD, asthma, and is non-smoker.  She has never used inhalers before.  Patient said he just wants to have something to stop her cough during the day and to help her sleep at night.   PLAN: Advised  to stop her over-the-counter medications. She declines steroids. She does not think she needs an inhaler-no wheezing.   Begin Augmentin 875 mg twice daily for 7 days for sinusitis. Tessalon Perles for daytime cough,  Dextromethorphan-guaifenesin for bedtime.   Patient is to push fluids clear fluids, hydrate well.  She is to rest.  Monitor blood sugars.    Patient was advised to get a Covid test given the phone number for [facility] testing.  She is to wear a mask when she leaves the house.  She is to have no company over her house while she has upper respiratory infection symptoms.  We discussed the rationale for this and she voices  understanding.    She does have an appointment for lab work next week and an office visit with Dr. Derrel Nip.  She needs to get refills on her Ambien. She is not taking Ambien currently.  Patient advised that this will need to be a video visit and she can get her lab work done at [facility]. She will be given further instructions by the staff.   I discussed the assessment and treatment plan with the patient. The patient was provided an opportunity to ask questions and all were answered. The patient agreed with the plan and demonstrated an understanding of the instructions.   The patient was advised to call back or seek an in-person evaluation if the symptoms worsen or if the condition fails to improve as anticipated.  I provided 15 minutes of non-face-to-face time during this encounter.  Denice Paradise, NP Adult Nurse Practitioner [place] [phone]

## 2020-05-20 DIAGNOSIS — Z20822 Contact with and (suspected) exposure to covid-19: Secondary | ICD-10-CM | POA: Diagnosis not present

## 2020-05-20 NOTE — Patient Instructions (Addendum)
Please stop her over-the-counter medications. Begin Augmentin 875 mg twice daily for 7 days  Tessalon Perles for daytime cough,  Dextromethorphan-guaifenesin for bedtime.   Please stay very well hydrated, rest, monitor blood sugars.   Patient was advised to get a Covid test and was given the phone number for [facility] testing.  She is to wear a mask when she leaves the house.  She is advised to go no place else but to get a Covid test and then return home and  wait till she hears for the results.  She was advised to have no company over the house while she is waiting for the Covid test results to return.  We discussed the rationale for this and she voices understanding.    She does have an appointment for lab work next week and an office visit with Dr. Derrel Nip.  She needs to get refills on her Ambien.  Patient advised that this will need to be a video visit and she can get her lab work done at [facility]. She will be given further instructions by the staff.   Please seek in-person  care over the weekend at one of the local Acute Care clinics- such as [facility] or [facility] if symptoms worsen or if this fails to improve as expected.     Sinusitis, Adult Sinusitis is inflammation of your sinuses. Sinuses are hollow spaces in the bones around your face. Your sinuses are located:  Around your eyes.  In the middle of your forehead.  Behind your nose.  In your cheekbones. Mucus normally drains out of your sinuses. When your nasal tissues become inflamed or swollen, mucus can become trapped or blocked. This allows bacteria, viruses, and fungi to grow, which leads to infection. Most infections of the sinuses are caused by a virus. Sinusitis can develop quickly. It can last for up to 4 weeks (acute) or for more than 12 weeks (chronic). Sinusitis often develops after a cold. What are the causes? This condition is caused by anything that creates swelling in the sinuses or stops mucus  from draining. This includes:  Allergies.  Asthma.  Infection from bacteria or viruses.  Deformities or blockages in your nose or sinuses.  Abnormal growths in the nose (nasal polyps).  Pollutants, such as chemicals or irritants in the air.  Infection from fungi (rare). What increases the risk? You are more likely to develop this condition if you:  Have a weak body defense system (immune system).  Do a lot of swimming or diving.  Overuse nasal sprays.  Smoke. What are the signs or symptoms? The main symptoms of this condition are pain and a feeling of pressure around the affected sinuses. Other symptoms include:  Stuffy nose or congestion.  Thick drainage from your nose.  Swelling and warmth over the affected sinuses.  Headache.  Upper toothache.  A cough that may get worse at night.  Extra mucus that collects in the throat or the back of the nose (postnasal drip).  Decreased sense of smell and taste.  Fatigue.  A fever.  Sore throat.  Bad breath. How is this diagnosed? This condition is diagnosed based on:  Your symptoms.  Your medical history.  A physical exam.  Tests to find out if your condition is acute or chronic. This may include: ? Checking your nose for nasal polyps. ? Viewing your sinuses using a device that has a light (endoscope). ? Testing for allergies or bacteria. ? Imaging tests, such  as an MRI or CT scan. In rare cases, a bone biopsy may be done to rule out more serious types of fungal sinus disease. How is this treated? Treatment for sinusitis depends on the cause and whether your condition is chronic or acute.  If caused by a virus, your symptoms should go away on their own within 10 days. You may be given medicines to relieve symptoms. They include: ? Medicines that shrink swollen nasal passages (topical intranasal decongestants). ? Medicines that treat allergies (antihistamines). ? A spray that eases inflammation of the  nostrils (topical intranasal corticosteroids). ? Rinses that help get rid of thick mucus in your nose (nasal saline washes).  If caused by bacteria, your health care provider may recommend waiting to see if your symptoms improve. Most bacterial infections will get better without antibiotic medicine. You may be given antibiotics if you have: ? A severe infection. ? A weak immune system.  If caused by narrow nasal passages or nasal polyps, you may need to have surgery. Follow these instructions at home: Medicines  Take, use, or apply over-the-counter and prescription medicines only as told by your health care provider. These may include nasal sprays.  If you were prescribed an antibiotic medicine, take it as told by your health care provider. Do not stop taking the antibiotic even if you start to feel better. Hydrate and humidify   Drink enough fluid to keep your urine pale yellow. Staying hydrated will help to thin your mucus.  Use a cool mist humidifier to keep the humidity level in your home above 50%.  Inhale steam for 10-15 minutes, 3-4 times a day, or as told by your health care provider. You can do this in the bathroom while a hot shower is running.  Limit your exposure to cool or dry air. Rest  Rest as much as possible.  Sleep with your head raised (elevated).  Make sure you get enough sleep each night. General instructions   Apply a warm, moist washcloth to your face 3-4 times a day or as told by your health care provider. This will help with discomfort.  Wash your hands often with soap and water to reduce your exposure to germs. If soap and water are not available, use hand sanitizer.  Do not smoke. Avoid being around people who are smoking (secondhand smoke).  Keep all follow-up visits as told by your health care provider. This is important. Contact a health care provider if:  You have a fever.  Your symptoms get worse.  Your symptoms do not improve within 10  days. Get help right away if:  You have a severe headache.  You have persistent vomiting.  You have severe pain or swelling around your face or eyes.  You have vision problems.  You develop confusion.  Your neck is stiff.  You have trouble breathing. Summary  Sinusitis is soreness and inflammation of your sinuses. Sinuses are hollow spaces in the bones around your face.  This condition is caused by nasal tissues that become inflamed or swollen. The swelling traps or blocks the flow of mucus. This allows bacteria, viruses, and fungi to grow, which leads to infection.  If you were prescribed an antibiotic medicine, take it as told by your health care provider. Do not stop taking the antibiotic even if you start to feel better.  Keep all follow-up visits as told by your health care provider. This is important. This information is not intended to replace advice given to you by  your health care provider. Make sure you discuss any questions you have with your health care provider. Document Revised: 04/27/2018 Document Reviewed: 04/27/2018 Elsevier Patient Education  [place].  Cough, Adult Coughing is a reflex that clears your throat and your airways (respiratory system). Coughing helps to heal and protect your lungs. It is normal to cough occasionally, but a cough that happens with other symptoms or lasts a long time may be a sign of a condition that needs treatment. An acute cough may only last 2-3 weeks, while a chronic cough may last 8 or more weeks. Coughing is commonly caused by:  Infection of the respiratory system by viruses or bacteria.  Breathing in substances that irritate your lungs.  Allergies.  Asthma.  Mucus that runs down the back of your throat (postnasal drip).  Smoking.  Acid backing up from the stomach into the esophagus (gastroesophageal reflux).  Certain medicines.  Chronic lung problems.  Other medical conditions such as heart failure or a  blood clot in the lung (pulmonary embolism). Follow these instructions at home: Medicines  Take over-the-counter and prescription medicines only as told by your health care provider.  Talk with your health care provider before you take a cough suppressant medicine. Lifestyle   Avoid cigarette smoke. Do not use any products that contain nicotine or tobacco, such as cigarettes, e-cigarettes, and chewing tobacco. If you need help quitting, ask your health care provider.  Drink enough fluid to keep your urine pale yellow.  Avoid caffeine.  Do not drink alcohol if your health care provider tells you not to drink. General instructions   Pay close attention to changes in your cough. Tell your health care provider about them.  Always cover your mouth when you cough.  Avoid things that make you cough, such as perfume, candles, cleaning products, or campfire or tobacco smoke.  If the air is dry, use a cool mist vaporizer or humidifier in your bedroom or your home to help loosen secretions.  If your cough is worse at night, try to sleep in a semi-upright position.  Rest as needed.  Keep all follow-up visits as told by your health care provider. This is important. Contact a health care provider if you:  Have new symptoms.  Cough up pus.  Have a cough that does not get better after 2-3 weeks or gets worse.  Cannot control your cough with cough suppressant medicines and you are losing sleep.  Have pain that gets worse or pain that is not helped with medicine.  Have a fever.  Have unexplained weight loss.  Have night sweats. Get help right away if:  You cough up blood.  You have difficulty breathing.  Your heartbeat is very fast. These symptoms may represent a serious problem that is an emergency. Do not wait to see if the symptoms will go away. Get medical help right away. Call your local emergency services (911 in the [place] Do not drive yourself to the  hospital. Summary  Coughing is a reflex that clears your throat and your airways. It is normal to cough occasionally, but a cough that happens with other symptoms or lasts a long time may be a sign of a condition that needs treatment.  Take over-the-counter and prescription medicines only as told by your health care provider.  Always cover your mouth when you cough.  Contact a health care provider if you have new symptoms or a cough that does not get better after 2-3 weeks or gets  worse. This information is not intended to replace advice given to you by your health care provider. Make sure you discuss any questions you have with your health care provider. Document Revised: 12/14/2018 Document Reviewed: 12/14/2018 Elsevier Patient Education  [place].  Upper Respiratory Infection, Adult An upper respiratory infection (URI) is a common viral infection of the nose, throat, and upper air passages that lead to the lungs. The most common type of URI is the common cold. URIs usually get better on their own, without medical treatment. What are the causes? A URI is caused by a virus. You may catch a virus by:  Breathing in droplets from an infected person's cough or sneeze.  Touching something that has been exposed to the virus (contaminated) and then touching your mouth, nose, or eyes. What increases the risk? You are more likely to get a URI if:  You are very young or very old.  It is autumn or winter.  You have close contact with others, such as at a daycare, school, or health care facility.  You smoke.  You have long-term (chronic) heart or lung disease.  You have a weakened disease-fighting (immune) system.  You have nasal allergies or asthma.  You are experiencing a lot of stress.  You work in an area that has poor air circulation.  You have poor nutrition. What are the signs or symptoms? A URI usually involves some of the following symptoms:  Runny or stuffy  (congested) nose.  Sneezing.  Cough.  Sore throat.  Headache.  Fatigue.  Fever.  Loss of appetite.  Pain in your forehead, behind your eyes, and over your cheekbones (sinus pain).  Muscle aches.  Redness or irritation of the eyes.  Pressure in the ears or face. How is this diagnosed? This condition may be diagnosed based on your medical history and symptoms, and a physical exam. Your health care provider may use a cotton swab to take a mucus sample from your nose (nasal swab). This sample can be tested to determine what virus is causing the illness. How is this treated? URIs usually get better on their own within 7-10 days. You can take steps at home to relieve your symptoms. Medicines cannot cure URIs, but your health care provider may recommend certain medicines to help relieve symptoms, such as:  Over-the-counter cold medicines.  Cough suppressants. Coughing is a type of defense against infection that helps to clear the respiratory system, so take these medicines only as recommended by your health care provider.  Fever-reducing medicines. Follow these instructions at home: Activity  Rest as needed.  If you have a fever, stay home from work or school until your fever is gone or until your health care provider says you are no longer contagious. Your health care provider may have you wear a face mask to prevent your infection from spreading. Relieving symptoms  Gargle with a salt-water mixture 3-4 times a day or as needed. To make a salt-water mixture, completely dissolve -1 tsp of salt in 1 cup of warm water.  Use a cool-mist humidifier to add moisture to the air. This can help you breathe more easily. Eating and drinking   Drink enough fluid to keep your urine pale yellow.  Eat soups and other clear broths. General instructions   Take over-the-counter and prescription medicines only as told by your health care provider. These include cold medicines, fever  reducers, and cough suppressants.  Do not use any products that contain nicotine or tobacco,  such as cigarettes and e-cigarettes. If you need help quitting, ask your health care provider.  Stay away from secondhand smoke.  Stay up to date on all immunizations, including the yearly (annual) flu vaccine.  Keep all follow-up visits as told by your health care provider. This is important. How to prevent the spread of infection to others   URIs can be passed from person to person (are contagious). To prevent the infection from spreading: ? Wash your hands often with soap and water. If soap and water are not available, use hand sanitizer. ? Avoid touching your mouth, face, eyes, or nose. ? Cough or sneeze into a tissue or your sleeve or elbow instead of into your hand or into the air. Contact a health care provider if:  You are getting worse instead of better.  You have a fever or chills.  Your mucus is brown or red.  You have yellow or brown discharge coming from your nose.  You have pain in your face, especially when you bend forward.  You have swollen neck glands.  You have pain while swallowing.  You have white areas in the back of your throat. Get help right away if:  You have shortness of breath that gets worse.  You have severe or persistent: ? Headache. ? Ear pain. ? Sinus pain. ? Chest pain.  You have chronic lung disease along with any of the following: ? Wheezing. ? Prolonged cough. ? Coughing up blood. ? A change in your usual mucus.  You have a stiff neck.  You have changes in your: ? Vision. ? Hearing. ? Thinking. ? Mood. Summary  An upper respiratory infection (URI) is a common infection of the nose, throat, and upper air passages that lead to the lungs.  A URI is caused by a virus.  URIs usually get better on their own within 7-10 days.  Medicines cannot cure URIs, but your health care provider may recommend certain medicines to help relieve  symptoms. This information is not intended to replace advice given to you by your health care provider. Make sure you discuss any questions you have with your health care provider. Document Revised: 12/03/2018 Document Reviewed: 07/11/2017 Elsevier Patient Education  [place].

## 2020-05-22 ENCOUNTER — Other Ambulatory Visit: Payer: Medicare Other

## 2020-05-22 ENCOUNTER — Telehealth: Payer: Self-pay | Admitting: Internal Medicine

## 2020-05-22 NOTE — Telephone Encounter (Signed)
Error in note, Dr. Derrel Nip put lab orders in.

## 2020-05-22 NOTE — Telephone Encounter (Signed)
Patient is going to [facility] for labs tomorrow. Please change lab orders to hospital location, Maudie Mercury put lab orders in. Pt also was tested over the weekend at [facility] for COVID, test was negative.

## 2020-05-23 ENCOUNTER — Other Ambulatory Visit
Admission: RE | Admit: 2020-05-23 | Discharge: 2020-05-23 | Disposition: A | Discharge status: Home / Self Care | Payer: Medicare Other | Attending: Internal Medicine | Admitting: Internal Medicine

## 2020-05-23 DIAGNOSIS — E1169 Type 2 diabetes mellitus with other specified complication: Secondary | ICD-10-CM | POA: Diagnosis not present

## 2020-05-23 DIAGNOSIS — E785 Hyperlipidemia, unspecified: Secondary | ICD-10-CM | POA: Insufficient documentation

## 2020-05-23 DIAGNOSIS — E669 Obesity, unspecified: Secondary | ICD-10-CM

## 2020-05-23 DIAGNOSIS — R748 Abnormal levels of other serum enzymes: Secondary | ICD-10-CM

## 2020-05-23 LAB — LIPID PANEL
Cholesterol: 142 mg/dL (ref 0–200)
HDL: 40 mg/dL — ABNORMAL LOW (ref >40)
LDL Cholesterol: 72 mg/dL (ref 0–99)
Total CHOL/HDL Ratio: 3.6 RATIO
Triglycerides: 151 mg/dL — ABNORMAL HIGH (ref <150)
VLDL: 30 mg/dL (ref 0–40)

## 2020-05-23 LAB — COMPREHENSIVE METABOLIC PANEL
ALT: 43 U/L (ref 0–44)
AST: 63 U/L — ABNORMAL HIGH (ref 15–41)
Albumin: 4.6 g/dL (ref 3.5–5.0)
Alkaline Phosphatase: 78 U/L (ref 38–126)
Anion gap: 12 (ref 5–15)
BUN: 17 mg/dL (ref 8–23)
CO2: 26 mmol/L (ref 22–32)
Calcium: 9.6 mg/dL (ref 8.9–10.3)
Chloride: 101 mmol/L (ref 98–111)
Creatinine, Ser: 0.87 mg/dL (ref 0.44–1.00)
GFR calc Af Amer: >60 mL/min (ref >60)
GFR calc non Af Amer: >60 mL/min (ref >60)
Glucose, Bld: 150 mg/dL — ABNORMAL HIGH (ref 70–99)
Potassium: 3.5 mmol/L (ref 3.5–5.1)
Sodium: 139 mmol/L (ref 135–145)
Total Bilirubin: 0.9 mg/dL (ref 0.3–1.2)
Total Protein: 8.1 g/dL (ref 6.5–8.1)

## 2020-05-23 LAB — HEMOGLOBIN A1C
Hgb A1c MFr Bld: 6.8 % — ABNORMAL HIGH (ref 4.8–5.6)
Mean Plasma Glucose: 148.46 mg/dL

## 2020-05-24 ENCOUNTER — Telehealth (INDEPENDENT_AMBULATORY_CARE_PROVIDER_SITE_OTHER): Payer: Medicare Other | Admitting: Internal Medicine

## 2020-05-24 ENCOUNTER — Encounter: Payer: Self-pay | Admitting: Internal Medicine

## 2020-05-24 VITALS — Ht 61.0 in | Wt 145.0 lb

## 2020-05-24 DIAGNOSIS — R7401 Elevation of levels of liver transaminase levels: Secondary | ICD-10-CM | POA: Diagnosis not present

## 2020-05-24 DIAGNOSIS — E1169 Type 2 diabetes mellitus with other specified complication: Secondary | ICD-10-CM

## 2020-05-24 DIAGNOSIS — I10 Essential (primary) hypertension: Secondary | ICD-10-CM

## 2020-05-24 DIAGNOSIS — R748 Abnormal levels of other serum enzymes: Secondary | ICD-10-CM | POA: Diagnosis not present

## 2020-05-24 DIAGNOSIS — E669 Obesity, unspecified: Secondary | ICD-10-CM

## 2020-05-24 DIAGNOSIS — E785 Hyperlipidemia, unspecified: Secondary | ICD-10-CM

## 2020-05-24 LAB — MICROALBUMIN / CREATININE URINE RATIO
Creatinine, Urine: 205.4 mg/dL
Microalb Creat Ratio: 12 mg/g creat (ref 0–29)
Microalb, Ur: 25.5 ug/mL — ABNORMAL HIGH

## 2020-05-24 MED ORDER — HYDROCOD POLST-CPM POLST ER 10-8 MG/5ML PO SUER: 5.0000 mL | Freq: Every evening | ORAL | 0 refills | Status: DC | PRN

## 2020-05-24 MED ORDER — PREDNISONE 10 MG PO TABS
ORAL_TABLET | ORAL | 0 refills | Status: DC
Start: 2020-05-24 — End: 2020-10-04

## 2020-05-24 MED ORDER — ZOLPIDEM TARTRATE 10 MG PO TABS: 10.0000 mg | ORAL_TABLET | Freq: Every evening | ORAL | 5 refills | Status: DC | PRN

## 2020-05-24 MED ORDER — LOSARTAN POTASSIUM-HCTZ 50-12.5 MG PO TABS
1.0000 | ORAL_TABLET | Freq: Every day | ORAL | 3 refills | Status: DC
Start: 2020-05-24 — End: 2021-02-15

## 2020-05-25 NOTE — Assessment & Plan Note (Signed)
Current AST elevation noted.  May be due to excessive coughing.  Will repeat in a few weeks

## 2020-05-25 NOTE — Progress Notes (Signed)
Virtual Visit via [place]  This visit type was conducted due to national recommendations for restrictions regarding the COVID-19 pandemic (e.g. social distancing).  This format is felt to be most appropriate for this patient at this time.  All issues noted in this document were discussed and addressed.  No physical exam was performed (except for noted visual exam findings with Video Visits).   I connected with@ on 05/24/20 at  2:00 PM EDT by a video enabled telemedicine application and verified that I am speaking with the correct person using two identifiers. Location patient: home Location provider: work or home office Persons participating in the virtual visit: patient, provider  I discussed the limitations, risks, security and privacy concerns of performing an evaluation and management service by telephone and the availability of in person appointments. I also discussed with the patient that there may be a patient responsible charge related to this service. The patient expressed understanding and agreed to proceed.   Reason for visit:  Follow up on type 2 DM , recent episode of sinusitis   HPI:  69 yr old female with type 2 DM recently treated for acute sinusitis.  COVID TESTING Was negative. Still coughing, ribs hurting ,  Cough worse at night.    T2DM:  She is not  exercising regularly or trying to lose weight. Checking  blood sugars less than once daily at variable times, usually only if she feels she may be having a hypoglycemic event. .  BS have been under 130 fasting and < 150 post prandially.  Denies any recent hypoglyemic events.  Taking   medications as directed. Following a carbohydrate modified diet 6 days per week. Denies numbness, burning and tingling of extremities. Appetite is good.    ROS: See pertinent positives and negatives per HPI.  Past Medical History:  Diagnosis Date   Diabetes mellitus    GERD (gastroesophageal reflux disease)    Hyperlipidemia     Hypertension     Past Surgical History:  Procedure Laterality Date   ABDOMINAL HYSTERECTOMY  1995    and BSO   APPENDECTOMY     BILATERAL OOPHORECTOMY     BUNIONECTOMY     CARPAL TUNNEL RELEASE Right    CESAREAN SECTION  1977 and 1978    Family History  Problem Relation Age of Onset   Stroke Mother    Heart murmur Mother    Mitral valve prolapse Mother    Cancer Father 66       carcinoid syndrome   Stomach cancer Father    Breast cancer Neg Hx     SOCIAL HX:  reports that she has never smoked. She has never used smokeless tobacco. She reports that she does not drink alcohol and does not use drugs.   Current Outpatient Medications:    Accu-Chek FastClix Lancets MISC, Use to check blood sugars twice daily. E11.9., Disp: 102 each, Rfl: 11   amoxicillin-clavulanate (AUGMENTIN) 875-125 MG tablet, Take 1 tablet by mouth 2 (two) times daily for 7 days., Disp: 14 tablet, Rfl: 0   Dextromethorphan-guaiFENesin 5-100 MG/5ML LIQD, Take 10 mLs by mouth at bedtime as needed (for cough)., Disp: 118 mL, Rfl: 0   diclofenac (VOLTAREN) 75 MG EC tablet, TAKE 1 TABLET BY MOUTH TWICE DAILY, Disp: 60 tablet, Rfl: 2   gabapentin (NEURONTIN) 300 MG capsule, Take 300 mg by mouth 3 (three) times daily. , Disp: , Rfl: 0   glipiZIDE (GLUCOTROL) 5 MG tablet, TAKE 1/2 TABLET (2.5 MG TOTAL) BY  MOUTH TWICE DAILY BEFORE A MEAL, Disp: 60 tablet, Rfl: 3   glucose blood test strip, Use to check blood sugars twice daily. E11.9. Accu-Chek  Guide Test Strips ( Rx [id number]), Disp: 100 each, Rfl: 12   metFORMIN (GLUCOPHAGE) 1000 MG tablet, Take 1 tablet (1,000 mg total) by mouth 2 (two) times daily with a meal., Disp: 180 tablet, Rfl: 0   montelukast (SINGULAIR) 10 MG tablet, TAKE 1 TABLET BY MOUTH AT BEDTIME, Disp: 90 tablet, Rfl: 1   omeprazole (PRILOSEC) 40 MG capsule, TAKE 1 CAPSULE BY MOUTH ONCE DAILY, Disp: 90 capsule, Rfl: 1   rosuvastatin (CRESTOR) 20 MG tablet, TAKE 1 TABLET BY MOUTH  ONCE A DAY, Disp: 90 tablet, Rfl: 3   traMADol (ULTRAM) 50 MG tablet, TAKE 1 TABLET BY MOUTH EVERY 6 HOURS AS NEEDED, Disp: 30 tablet, Rfl: 0   triamcinolone cream (KENALOG) 0.1 %, Apply 1 application topically 2 (two) times daily., Disp: 30 g, Rfl: 0   valACYclovir (VALTREX) 500 MG tablet, Take 1 tablet (500 mg total) by mouth 3 (three) times daily., Disp: 21 tablet, Rfl: 2   zolpidem (AMBIEN) 10 MG tablet, Take 1 tablet (10 mg total) by mouth at bedtime as needed. for sleep, Disp: 30 tablet, Rfl: 5   benzonatate (TESSALON PERLES) 100 MG capsule, Take 1 capsule (100 mg total) by mouth 3 (three) times daily as needed for up to 7 days for cough. (Patient not taking: Reported on 05/24/2020), Disp: 21 capsule, Rfl: 0   chlorpheniramine-HYDROcodone (TUSSIONEX PENNKINETIC ER) 10-8 MG/5ML SUER, Take 5 mLs by mouth at bedtime as needed., Disp: 140 mL, Rfl: 0   losartan-hydrochlorothiazide (HYZAAR) 50-12.5 MG tablet, Take 1 tablet by mouth daily., Disp: 90 tablet, Rfl: 3   predniSONE (DELTASONE) 10 MG tablet, 6 tablets on Day 1 , then reduce by 1 tablet daily until gone, Disp: 21 tablet, Rfl: 0  EXAM:  VITALS per patient if applicable:  GENERAL: alert, oriented, appears well and in no acute distress  HEENT: atraumatic, conjunttiva clear, no obvious abnormalities on inspection of external nose and ears  NECK: normal movements of the head and neck  LUNGS: on inspection no signs of respiratory distress, breathing rate appears normal, no obvious gross SOB, gasping or wheezing  CV: no obvious cyanosis  MS: moves all visible extremities without noticeable abnormality  PSYCH/NEURO: pleasant and cooperative, no obvious depression or anxiety, speech and thought processing grossly intact  ASSESSMENT AND PLAN:  Discussed the following assessment and plan:  Elevated AST (SGOT) - Plan: Comprehensive metabolic panel  Diabetes mellitus type 2 in obese (HCC)  Elevated liver enzymes  Essential  hypertension  Hyperlipidemia due to type 2 diabetes mellitus ([place])  Diabetes mellitus type 2 in obese She has been taking the  Increased metformin dose of 1000 mg bid for management of postprandial hyperglycemia and  2.5 mg glipizide bid  For a1c  > 7.0.    She has microalbuminuria but has a history of allergic reaction to ACE Inhibitor.  Fasting sugars have improved and are generally < 140   Lab Results  Component Value Date   HGBA1C 6.8 (H) 05/23/2020   Lab Results  Component Value Date   MICROALBUR 25.5 (H) 05/23/2020       Elevated liver enzymes Current AST elevation noted.  May be due to excessive coughing.  Will repeat in a few weeks   Essential hypertension Well controlled on losartan hct.  Combining the two.  Renal function stable, no changes today.  Lab Results  Component Value Date   CREATININE 0.87 05/23/2020   Lab Results  Component Value Date   NA 139 05/23/2020   K 3.5 05/23/2020   CL 101 05/23/2020   CO2 26 05/23/2020     Hyperlipidemia due to type 2 diabetes mellitus ([place]) LDL is at goal on rosuvastatin.  No changes today  Lab Results  Component Value Date   CHOL 142 05/23/2020   HDL 40 (L) 05/23/2020   LDLCALC 72 05/23/2020   LDLDIRECT 71.0 06/02/2017   TRIG 151 (H) 05/23/2020   CHOLHDL 3.6 05/23/2020   Lab Results  Component Value Date   ALT 43 05/23/2020   AST 63 (H) 05/23/2020   ALKPHOS 78 05/23/2020   BILITOT 0.9 05/23/2020       I discussed the assessment and treatment plan with the patient. The patient was provided an opportunity to ask questions and all were answered. The patient agreed with the plan and demonstrated an understanding of the instructions.   The patient was advised to call back or seek an in-person evaluation if the symptoms worsen or if the condition fails to improve as anticipated.   I provided  30 minutes of non-face-to-face time during this encounter reviewing patient's current problems and past surgeries, labs  and imaging studies, providing counseling on the above mentioned problems , and coordination  of care .  Crecencio Mc, MD

## 2020-05-25 NOTE — Assessment & Plan Note (Signed)
Well controlled on losartan hct.  Combining the two.  Renal function stable, no changes today.  Lab Results  Component Value Date   CREATININE 0.87 05/23/2020   Lab Results  Component Value Date   NA 139 05/23/2020   K 3.5 05/23/2020   CL 101 05/23/2020   CO2 26 05/23/2020

## 2020-05-25 NOTE — Assessment & Plan Note (Signed)
She has been taking the  Increased metformin dose of 1000 mg bid for management of postprandial hyperglycemia and  2.5 mg glipizide bid  For a1c  > 7.0.    She has microalbuminuria but has a history of allergic reaction to ACE Inhibitor.  Fasting sugars have improved and are generally < 140   Lab Results  Component Value Date   HGBA1C 6.8 (H) 05/23/2020   Lab Results  Component Value Date   MICROALBUR 25.5 (H) 05/23/2020

## 2020-05-25 NOTE — Assessment & Plan Note (Signed)
LDL is at goal on rosuvastatin.  No changes today  Lab Results  Component Value Date   CHOL 142 05/23/2020   HDL 40 (L) 05/23/2020   LDLCALC 72 05/23/2020   LDLDIRECT 71.0 06/02/2017   TRIG 151 (H) 05/23/2020   CHOLHDL 3.6 05/23/2020   Lab Results  Component Value Date   ALT 43 05/23/2020   AST 63 (H) 05/23/2020   ALKPHOS 78 05/23/2020   BILITOT 0.9 05/23/2020

## 2020-06-26 ENCOUNTER — Other Ambulatory Visit: Payer: Self-pay | Admitting: Internal Medicine

## 2020-07-06 DIAGNOSIS — Z20822 Contact with and (suspected) exposure to covid-19: Secondary | ICD-10-CM | POA: Diagnosis not present

## 2020-07-27 ENCOUNTER — Other Ambulatory Visit: Payer: Self-pay

## 2020-07-27 MED ORDER — GLIPIZIDE 5 MG PO TABS: ORAL_TABLET | ORAL | 1 refills | Status: DC

## 2020-07-31 ENCOUNTER — Other Ambulatory Visit: Payer: Self-pay | Admitting: Internal Medicine

## 2020-08-07 ENCOUNTER — Other Ambulatory Visit: Payer: Self-pay

## 2020-08-25 ENCOUNTER — Other Ambulatory Visit: Payer: Self-pay | Admitting: Internal Medicine

## 2020-09-29 ENCOUNTER — Other Ambulatory Visit: Payer: Self-pay

## 2020-10-01 ENCOUNTER — Other Ambulatory Visit: Payer: Self-pay | Admitting: Internal Medicine

## 2020-10-02 DIAGNOSIS — Z23 Encounter for immunization: Secondary | ICD-10-CM | POA: Diagnosis not present

## 2020-10-04 ENCOUNTER — Other Ambulatory Visit: Payer: Self-pay

## 2020-10-04 ENCOUNTER — Ambulatory Visit (INDEPENDENT_AMBULATORY_CARE_PROVIDER_SITE_OTHER): Payer: Medicare Other | Admitting: Internal Medicine

## 2020-10-04 ENCOUNTER — Encounter: Payer: Self-pay | Admitting: Internal Medicine

## 2020-10-04 ENCOUNTER — Ambulatory Visit (INDEPENDENT_AMBULATORY_CARE_PROVIDER_SITE_OTHER): Payer: Medicare Other

## 2020-10-04 VITALS — BP 138/80 | HR 71 | Temp 98.0°F | Resp 16 | Ht 61.0 in | Wt 148.6 lb

## 2020-10-04 DIAGNOSIS — E1169 Type 2 diabetes mellitus with other specified complication: Secondary | ICD-10-CM

## 2020-10-04 DIAGNOSIS — D126 Benign neoplasm of colon, unspecified: Secondary | ICD-10-CM

## 2020-10-04 DIAGNOSIS — I10 Essential (primary) hypertension: Secondary | ICD-10-CM | POA: Diagnosis not present

## 2020-10-04 DIAGNOSIS — Z8701 Personal history of pneumonia (recurrent): Secondary | ICD-10-CM

## 2020-10-04 DIAGNOSIS — J189 Pneumonia, unspecified organism: Secondary | ICD-10-CM | POA: Diagnosis not present

## 2020-10-04 DIAGNOSIS — Z1231 Encounter for screening mammogram for malignant neoplasm of breast: Secondary | ICD-10-CM | POA: Diagnosis not present

## 2020-10-04 DIAGNOSIS — K760 Fatty (change of) liver, not elsewhere classified: Secondary | ICD-10-CM

## 2020-10-04 DIAGNOSIS — E669 Obesity, unspecified: Secondary | ICD-10-CM | POA: Diagnosis not present

## 2020-10-04 DIAGNOSIS — E785 Hyperlipidemia, unspecified: Secondary | ICD-10-CM | POA: Diagnosis not present

## 2020-10-04 DIAGNOSIS — Z1211 Encounter for screening for malignant neoplasm of colon: Secondary | ICD-10-CM

## 2020-10-04 DIAGNOSIS — E663 Overweight: Secondary | ICD-10-CM | POA: Diagnosis not present

## 2020-10-04 NOTE — Patient Instructions (Addendum)
Ok to contine diclofenac up to 2 times daily with omeprazole  40 mg once daily or GI protection    You can add up to 2000 mg of acetominophen (tylenol) every day safely  In divided doses (500 mg every 6 hours  Or 1000 mg every 12 hours.)   You may be due for colonoscopy again so I have made the referral  Your annual mammogram has been ordered.  You are encouraged (required) to call to make your appointment at [facility] [phone]

## 2020-10-04 NOTE — Progress Notes (Signed)
Subjective:  Patient ID: Sara Mills, female    DOB: [birth year]  Age: 69 y.o. MRN: [id number]  CC: The primary encounter diagnosis was Diabetes mellitus type 2 in obese ([place]). Diagnoses of Hyperlipidemia due to type 2 diabetes mellitus ([place]), Breast cancer screening by mammogram, Colon cancer screening, History of pneumonia, Benign neoplasm of colon, unspecified part of colon, Essential hypertension, Overweight (BMI 25.0-29.9), and Hepatic steatosis were also pertinent to this visit.  HPI Sara Mills presents for FOLLOW UP on type 2 DM and hypertension   She reports chronic daily fatigue and chronic pain involving the posterior neck and shoulder and back .  Using diclofenac bid   Treated for sinusitis /Bronchitis in June , resolved,  Then recurred I n July with accompanying symptoms of chest heaviness.  Lasted 3 weeks  .  Both times tested and Covid negative   Hypertension: patient checks blood pressure twice weekly at home.  Readings have been for the most part < 120/70 at rest . Patient is following a reduce salt diet most days and is taking medications as prescribed   T2DM:  She  feels generally well,  But is not  exercising regularly or trying to lose weight due to her neck, shoulder and back pain . Checking  blood sugars less than once daily at variable times, usually only if she feels she may be having a hypoglycemic event. .  BS have been under 130 fasting and < 150 post prandially.  Denies any recent hypoglyemic events.  Taking   medications as directed. Following a carbohydrate modified diet 6 days per week. Denies numbness, burning and tingling of extremities. Appetite is good.   Outpatient Medications Prior to Visit  Medication Sig Dispense Refill  . Accu-Chek FastClix Lancets MISC Use to check blood sugars twice daily. E11.9. 102 each 11  . diclofenac (VOLTAREN) 75 MG EC tablet TAKE 1 TABLET BY MOUTH TWICE DAILY 60 tablet 2  . glipiZIDE (GLUCOTROL) 5 MG tablet TAKE 1/2  TABLET (2.5 MG TOTAL) BY MOUTH TWICE DAILY BEFORE A MEAL 90 tablet 1  . glucose blood test strip Use to check blood sugars twice daily. E11.9. Accu-Chek  Guide Test Strips ( Rx [id number]) 100 each 12  . losartan-hydrochlorothiazide (HYZAAR) 50-12.5 MG tablet Take 1 tablet by mouth daily. 90 tablet 3  . metFORMIN (GLUCOPHAGE) 1000 MG tablet TAKE 1 TABLET (1,000 MG TOTAL) BY MOUTH 2 (TWO) TIMES DAILY WITH A MEAL. 180 tablet 0  . omeprazole (PRILOSEC) 40 MG capsule TAKE 1 CAPSULE BY MOUTH ONCE DAILY 90 capsule 1  . rosuvastatin (CRESTOR) 20 MG tablet TAKE 1 TABLET BY MOUTH ONCE DAILY 90 tablet 2  . traMADol (ULTRAM) 50 MG tablet TAKE 1 TABLET BY MOUTH EVERY 6 HOURS AS NEEDED 30 tablet 0  . zolpidem (AMBIEN) 10 MG tablet Take 1 tablet (10 mg total) by mouth at bedtime as needed. for sleep 30 tablet 5  . montelukast (SINGULAIR) 10 MG tablet TAKE 1 TABLET BY MOUTH AT BEDTIME 90 tablet 1  . valACYclovir (VALTREX) 500 MG tablet Take 1 tablet (500 mg total) by mouth 3 (three) times daily. (Patient not taking: Reported on 10/04/2020) 21 tablet 2  . chlorpheniramine-HYDROcodone (TUSSIONEX PENNKINETIC ER) 10-8 MG/5ML SUER Take 5 mLs by mouth at bedtime as needed. (Patient not taking: Reported on 10/04/2020) 140 mL 0  . Dextromethorphan-guaiFENesin 5-100 MG/5ML LIQD Take 10 mLs by mouth at bedtime as needed (for cough). (Patient not taking: Reported on 10/04/2020) 118 mL 0  .  gabapentin (NEURONTIN) 300 MG capsule Take 300 mg by mouth 3 (three) times daily.  (Patient not taking: Reported on 10/04/2020)  0  . hydrochlorothiazide (MICROZIDE) 12.5 MG capsule TAKE 1 CAPSULE BY MOUTH ONCE DAILY (Patient not taking: Reported on 10/04/2020) 90 capsule 1  . predniSONE (DELTASONE) 10 MG tablet 6 tablets on Day 1 , then reduce by 1 tablet daily until gone (Patient not taking: Reported on 10/04/2020) 21 tablet 0  . triamcinolone cream (KENALOG) 0.1 % Apply 1 application topically 2 (two) times daily. (Patient not taking:  Reported on 10/04/2020) 30 g 0   No facility-administered medications prior to visit.    Review of Systems;  Patient denies headache, fevers, malaise, unintentional weight loss, skin rash, eye pain, sinus congestion and sinus pain, sore throat, dysphagia,  hemoptysis , cough, dyspnea, wheezing, chest pain, palpitations, orthopnea, edema, abdominal pain, nausea, melena, diarrhea, constipation, flank pain, dysuria, hematuria, urinary  Frequency, nocturia, numbness, tingling, seizures,  Focal weakness, Loss of consciousness,  Tremor, insomnia, depression, anxiety, and suicidal ideation.      Objective:  BP 138/80 (BP Location: Left Arm, Patient Position: Sitting, Cuff Size: Normal)   Pulse 71   Temp 98 F (36.7 C) (Oral)   Resp 16   Ht 5\' 1"  (1.549 m)   Wt 148 lb 9.6 oz (67.4 kg)   SpO2 98%   BMI 28.08 kg/m   BP Readings from Last 3 Encounters:  10/04/20 138/80  09/03/19 122/80  02/17/19 120/78    Wt Readings from Last 3 Encounters:  10/04/20 148 lb 9.6 oz (67.4 kg)  05/24/20 145 lb (65.8 kg)  05/19/20 148 lb (67.1 kg)    General appearance: alert, cooperative and appears stated age Ears: normal TM's and external ear canals both ears Throat: lips, mucosa, and tongue normal; teeth and gums normal Neck: no adenopathy, no carotid bruit, supple, symmetrical, trachea midline and thyroid not enlarged, symmetric, no tenderness/mass/nodules Back: symmetric, no curvature. ROM normal. No CVA tenderness. Lungs: clear to auscultation bilaterally Heart: regular rate and rhythm, S1, S2 normal, no murmur, click, rub or gallop Abdomen: soft, non-tender; bowel sounds normal; no masses,  no organomegaly Pulses: 2+ and symmetric Skin: Skin color, texture, turgor normal. No rashes or lesions Lymph nodes: Cervical, supraclavicular, and axillary nodes normal.  Lab Results  Component Value Date   HGBA1C 7.3 (H) 10/04/2020   HGBA1C 6.8 (H) 05/23/2020   HGBA1C 6.9 (H) 11/26/2019    Lab  Results  Component Value Date   CREATININE 1.02 10/04/2020   CREATININE 0.87 05/23/2020   CREATININE 0.83 11/26/2019    Lab Results  Component Value Date   WBC 5.2 11/26/2019   HGB 12.2 11/26/2019   HCT 37.0 11/26/2019   PLT 123.0 (L) 11/26/2019   GLUCOSE 150 (H) 10/04/2020   CHOL 140 10/04/2020   TRIG 246.0 (H) 10/04/2020   HDL 41.20 10/04/2020   LDLDIRECT 72.0 10/04/2020   LDLCALC 72 05/23/2020   ALT 41 (H) 10/04/2020   AST 35 10/04/2020   NA 140 10/04/2020   K 3.7 10/04/2020   CL 98 10/04/2020   CREATININE 1.02 10/04/2020   BUN 21 10/04/2020   CO2 29 10/04/2020   TSH 2.42 11/26/2019   HGBA1C 7.3 (H) 10/04/2020   MICROALBUR 25.5 (H) 05/23/2020    No results found.  Assessment & Plan:   Problem List Items Addressed This Visit      Unprioritized   COLONIC POLYPS    She supposedly had an EGD/colonoscopy by Dr  mann Dec 2016 at [facility] for follow up on tubular adenoma.  No record of colonoscopy in chart. Referral made for follow up 5 yrs      Hyperlipidemia due to type 2 diabetes mellitus ([place])    LDL is at goal on rosuvastatin 20 mg daily .  No changes today  Lab Results  Component Value Date   CHOL 140 10/04/2020   HDL 41.20 10/04/2020   LDLCALC 72 05/23/2020   LDLDIRECT 72.0 10/04/2020   TRIG 246.0 (H) 10/04/2020   CHOLHDL 3 10/04/2020   Lab Results  Component Value Date   ALT 41 (H) 10/04/2020   AST 35 10/04/2020   ALKPHOS 82 10/04/2020   BILITOT 0.5 10/04/2020   20       Relevant Orders   Lipid panel (Completed)   Essential hypertension    Based on home readings her BP is Well controlled on losartan hct.  50/12.5 mg daily .  Renal function stable, no changes today.  Lab Results  Component Value Date   CREATININE 1.02 10/04/2020   Lab Results  Component Value Date   NA 140 10/04/2020   K 3.7 10/04/2020   CL 98 10/04/2020   CO2 29 10/04/2020         Overweight (BMI 25.0-29.9)     I have addressed  BMI and  recommended a low glycemic index diet regular participation in aerobic exercise with a goal of 30 minutes of aerobic exercise a minimum of 5 days per week.       Diabetes mellitus type 2 in obese ([place]) - Primary    She has been taking the  Increased metformin dose of 1000 mg bid for management of postprandial hyperglycemia and  2.5 mg glipizide bid  For a1c  > 7.0.    She has microalbuminuria,  has a history of allergic reaction to ACE Inhibitor, but is tolerating ARB. A!c has risen .  Will recommend trial of ozempic for weight loss and alternative to glipizide    Lab Results  Component Value Date   HGBA1C 7.3 (H) 10/04/2020   Lab Results  Component Value Date   MICROALBUR 25.5 (H) 05/23/2020           Relevant Orders   Hemoglobin A1c (Completed)   Comprehensive metabolic panel (Completed)   Hepatic steatosis    Taking metformin and statin.  Wt loss advised.  ALT still slightly elevated   Lab Results  Component Value Date   ALT 41 (H) 10/04/2020   AST 35 10/04/2020   ALKPHOS 82 10/04/2020   BILITOT 0.5 10/04/2020         History of pneumonia    Chest x ray ordered given recurrence of symptoms this summer.      Relevant Orders   DG Chest 2 View (Completed)    Other Visit Diagnoses    Breast cancer screening by mammogram       Relevant Orders   MM 3D SCREEN BREAST BILATERAL   Colon cancer screening       Relevant Orders   Ambulatory referral to Gastroenterology      I have discontinued Larena L. Vickrey's gabapentin, triamcinolone cream, Dextromethorphan-guaiFENesin, predniSONE, chlorpheniramine-HYDROcodone, and hydrochlorothiazide. I am also having her maintain her valACYclovir, omeprazole, traMADol, glucose blood, Accu-Chek FastClix Lancets, losartan-hydrochlorothiazide, zolpidem, glipiZIDE, metFORMIN, rosuvastatin, and diclofenac.  No orders of the defined types were placed in this encounter.   Medications Discontinued During This Encounter  Medication  Reason  . chlorpheniramine-HYDROcodone (TUSSIONEX  PENNKINETIC ER) 10-8 MG/5ML SUER Patient has not taken in last 30 days  . Dextromethorphan-guaiFENesin 5-100 MG/5ML LIQD Patient has not taken in last 30 days  . gabapentin (NEURONTIN) 300 MG capsule Patient has not taken in last 30 days  . hydrochlorothiazide (MICROZIDE) 12.5 MG capsule Change in therapy  . predniSONE (DELTASONE) 10 MG tablet Patient has not taken in last 30 days  . triamcinolone cream (KENALOG) 0.1 % Patient has not taken in last 30 days   I provided  30 minutes of  face-to-face time during this encounter reviewing patient's current problems and past surgeries, labs and imaging studies, providing counseling on the above mentioned problems , and coordination  of care .  Follow-up: Return in about 6 months (around 04/04/2021) for follow up diabetes.   Crecencio Mc, MD

## 2020-10-05 ENCOUNTER — Other Ambulatory Visit: Payer: Self-pay | Admitting: Internal Medicine

## 2020-10-05 ENCOUNTER — Encounter: Payer: Self-pay | Admitting: Internal Medicine

## 2020-10-05 DIAGNOSIS — Z8701 Personal history of pneumonia (recurrent): Secondary | ICD-10-CM | POA: Insufficient documentation

## 2020-10-05 LAB — COMPREHENSIVE METABOLIC PANEL
ALT: 41 U/L — ABNORMAL HIGH (ref 0–35)
AST: 35 U/L (ref 0–37)
Albumin: 4.6 g/dL (ref 3.5–5.2)
Alkaline Phosphatase: 82 U/L (ref 39–117)
BUN: 21 mg/dL (ref 6–23)
CO2: 29 mEq/L (ref 19–32)
Calcium: 9.9 mg/dL (ref 8.4–10.5)
Chloride: 98 mEq/L (ref 96–112)
Creatinine, Ser: 1.02 mg/dL (ref 0.40–1.20)
GFR: 57.05 mL/min — ABNORMAL LOW (ref >60.00)
Glucose, Bld: 150 mg/dL — ABNORMAL HIGH (ref 70–99)
Potassium: 3.7 mEq/L (ref 3.5–5.1)
Sodium: 140 mEq/L (ref 135–145)
Total Bilirubin: 0.5 mg/dL (ref 0.2–1.2)
Total Protein: 7 g/dL (ref 6.0–8.3)

## 2020-10-05 LAB — HEMOGLOBIN A1C: Hgb A1c MFr Bld: 7.3 % — ABNORMAL HIGH (ref 4.6–6.5)

## 2020-10-05 LAB — LIPID PANEL
Cholesterol: 140 mg/dL (ref 0–200)
HDL: 41.2 mg/dL (ref >39.00)
NonHDL: 98.58
Total CHOL/HDL Ratio: 3
Triglycerides: 246 mg/dL — ABNORMAL HIGH (ref 0.0–149.0)
VLDL: 49.2 mg/dL — ABNORMAL HIGH (ref 0.0–40.0)

## 2020-10-05 LAB — LDL CHOLESTEROL, DIRECT: Direct LDL: 72 mg/dL

## 2020-10-05 NOTE — Assessment & Plan Note (Signed)
Chest x ray ordered given recurrence of symptoms this summer.

## 2020-10-05 NOTE — Assessment & Plan Note (Signed)
Taking metformin and statin.  Wt loss advised.  ALT still slightly elevated   Lab Results  Component Value Date   ALT 41 (H) 10/04/2020   AST 35 10/04/2020   ALKPHOS 82 10/04/2020   BILITOT 0.5 10/04/2020

## 2020-10-05 NOTE — Assessment & Plan Note (Signed)
LDL is at goal on rosuvastatin 20 mg daily .  No changes today  Lab Results  Component Value Date   CHOL 140 10/04/2020   HDL 41.20 10/04/2020   LDLCALC 72 05/23/2020   LDLDIRECT 72.0 10/04/2020   TRIG 246.0 (H) 10/04/2020   CHOLHDL 3 10/04/2020   Lab Results  Component Value Date   ALT 41 (H) 10/04/2020   AST 35 10/04/2020   ALKPHOS 82 10/04/2020   BILITOT 0.5 10/04/2020   20

## 2020-10-05 NOTE — Assessment & Plan Note (Signed)
Based on home readings her BP is Well controlled on losartan hct.  50/12.5 mg daily .  Renal function stable, no changes today.  Lab Results  Component Value Date   CREATININE 1.02 10/04/2020   Lab Results  Component Value Date   NA 140 10/04/2020   K 3.7 10/04/2020   CL 98 10/04/2020   CO2 29 10/04/2020

## 2020-10-05 NOTE — Assessment & Plan Note (Signed)
She supposedly had an EGD/colonoscopy by Dr Collene Mares Dec 2016 at [facility] for follow up on tubular adenoma.  No record of colonoscopy in chart. Referral made for follow up 5 yrs

## 2020-10-05 NOTE — Progress Notes (Signed)
Your chest x ray has not been read yet.  Your a1c has increased to 7.3 and your other labs are stable.  I would like you to consider an alternative to glipizide to co manage your diabetes with the metformin you are already taking  Your diabetes  control has slipped a little.  Given your concurrent weight gain,  I am  recommending adding Ozempic to your current regimen.    ozempic is a medication that is taken as a weekly subcutaneous injection. It is not insulin.  It  causes your pancreas to increase its  own insulin secretion  And also slows down the emptying of your stomach,  So it decreases your appetite and helps you lose weight. If you are willing to learn how to take this medication, please let me know and I will send it to your pharmacy and schedule you a nurse visit to learn how to give it to yourself.    Regards,   Deborra Medina, MD

## 2020-10-05 NOTE — Assessment & Plan Note (Signed)
I have addressed  BMI and recommended a low glycemic index diet regular participation in aerobic exercise with a goal of 30 minutes of aerobic exercise a minimum of 5 days per week.

## 2020-10-05 NOTE — Assessment & Plan Note (Signed)
She has been taking the  Increased metformin dose of 1000 mg bid for management of postprandial hyperglycemia and  2.5 mg glipizide bid  For a1c  > 7.0.    She has microalbuminuria,  has a history of allergic reaction to ACE Inhibitor, but is tolerating ARB. A!c has risen .  Will recommend trial of ozempic for weight loss and alternative to glipizide    Lab Results  Component Value Date   HGBA1C 7.3 (H) 10/04/2020   Lab Results  Component Value Date   MICROALBUR 25.5 (H) 05/23/2020

## 2020-10-07 NOTE — Progress Notes (Signed)
Your chest x ray is clear.  There is no sign of recent pneumonia or mass.  Regards,   Deborra Medina, MD

## 2020-10-11 ENCOUNTER — Other Ambulatory Visit: Payer: Self-pay | Admitting: Internal Medicine

## 2020-10-11 MED ORDER — GLIPIZIDE 5 MG PO TABS: ORAL_TABLET | ORAL | 1 refills | Status: DC

## 2020-10-11 NOTE — Progress Notes (Signed)
There are no other recommended alternatives,  so I recommend continuining metformin and increasing glipizide to 5 mg with evening meal,, and continuing 2.5 mg with breakfast  Regards,   Deborra Medina, MD

## 2020-11-08 ENCOUNTER — Telehealth: Payer: Self-pay

## 2020-11-08 MED ORDER — OMEPRAZOLE 40 MG PO CPDR: 40.0000 mg | DELAYED_RELEASE_CAPSULE | Freq: Every day | ORAL | 1 refills | Status: DC

## 2020-11-08 NOTE — Telephone Encounter (Signed)
Pt would like a refill of omeprazole (PRILOSEC) 40 MG capsule sent to [facility] in [facility]

## 2020-11-16 ENCOUNTER — Other Ambulatory Visit: Payer: Self-pay | Admitting: Internal Medicine

## 2020-11-24 ENCOUNTER — Ambulatory Visit (INDEPENDENT_AMBULATORY_CARE_PROVIDER_SITE_OTHER): Payer: Medicare Other

## 2020-11-24 VITALS — BP 124/70 | HR 77 | Ht 61.0 in | Wt 148.0 lb

## 2020-11-24 DIAGNOSIS — Z0000 Encounter for general adult medical examination without abnormal findings: Secondary | ICD-10-CM | POA: Diagnosis not present

## 2020-11-24 NOTE — Patient Instructions (Addendum)
Sara Mills , Thank you for taking time to come for your Medicare Wellness Visit. I appreciate your ongoing commitment to your health goals. Please review the following plan we discussed and let me know if I can assist you in the future.   These are the goals we discussed: Goals      Patient Stated   .  Increase physical activity (pt-stated)      150 minutes exercise weekly, 5 days/30 minutes      Other   .  Low carb diet       This is a list of the screening recommended for you and due dates:  Health Maintenance  Topic Date Due  . Mammogram  01/20/2020  . Pneumonia vaccines (2 of 2 - PPSV23) 11/24/2021*  . Eye exam for diabetics  01/09/2021  . Colon Cancer Screening  03/20/2021  . Hemoglobin A1C  04/04/2021  . Complete foot exam   10/04/2021  . Tetanus Vaccine  01/11/2024  . Flu Shot  Completed  . DEXA scan (bone density measurement)  Completed  . COVID-19 Vaccine  Completed  .  Hepatitis C: One time screening is recommended by Center for Disease Control  (CDC) for  adults born from [birth year] through 1963.   Completed  *Topic was postponed. The date shown is not the original due date.    Immunizations Immunization History  Administered Date(s) Administered  . Fluad Quad(high Dose 65+) 09/03/2019, 10/02/2020  . Hep A / Hep B 07/13/2019, 09/06/2019, 02/11/2020  . Influenza Split 10/20/2013  . Influenza, High Dose Seasonal PF 09/28/2018  . Influenza,inj,Quad PF,6+ Mos 08/12/2014, 09/21/2016, 09/08/2017  . Influenza-Unspecified 09/08/2012, 09/22/2015  . PFIZER SARS-COV-2 Vaccination 01/01/2020, 01/22/2020, 10/02/2020  . Pneumococcal Conjugate-13 08/12/2014  . Pneumococcal Polysaccharide-23 09/15/2015  . Td 05/01/1998  . Tdap 01/10/2014  . Zoster Recombinat (Shingrix) 12/21/2018, 02/22/2019   Keep all routine maintenance appointments.   Follow up 04/04/21 @ 10:00.  Conditions/risks identified: None new.   Follow up in one year for your annual wellness visit   Preventive  Care 65 Years and Older, Female Preventive care refers to lifestyle choices and visits with your health care provider that can promote health and wellness. What does preventive care include?  A yearly physical exam. This is also called an annual well check.  Dental exams once or twice a year.  Routine eye exams. Ask your health care provider how often you should have your eyes checked.  Personal lifestyle choices, including:  Daily care of your teeth and gums.  Regular physical activity.  Eating a healthy diet.  Avoiding tobacco and drug use.  Limiting alcohol use.  Practicing safe sex.  Taking low-dose aspirin every day.  Taking vitamin and mineral supplements as recommended by your health care provider. What happens during an annual well check? The services and screenings done by your health care provider during your annual well check will depend on your age, overall health, lifestyle risk factors, and family history of disease. Counseling  Your health care provider may ask you questions about your:  Alcohol use.  Tobacco use.  Drug use.  Emotional well-being.  Home and relationship well-being.  Sexual activity.  Eating habits.  History of falls.  Memory and ability to understand (cognition).  Work and work Statistician.  Reproductive health. Screening  You may have the following tests or measurements:  Height, weight, and BMI.  Blood pressure.  Lipid and cholesterol levels. These may be checked every 5 years, or more frequently if  you are over 63 years old.  Skin check.  Lung cancer screening. You may have this screening every year starting at age 64 if you have a 30-pack-year history of smoking and currently smoke or have quit within the past 15 years.  Fecal occult blood test (FOBT) of the stool. You may have this test every year starting at age 5.  Flexible sigmoidoscopy or colonoscopy. You may have a sigmoidoscopy every 5 years or a  colonoscopy every 10 years starting at age 44.  Hepatitis C blood test.  Hepatitis B blood test.  Sexually transmitted disease (STD) testing.  Diabetes screening. This is done by checking your blood sugar (glucose) after you have not eaten for a while (fasting). You may have this done every 1-3 years.  Bone density scan. This is done to screen for osteoporosis. You may have this done starting at age 58.  Mammogram. This may be done every 1-2 years. Talk to your health care provider about how often you should have regular mammograms. Talk with your health care provider about your test results, treatment options, and if necessary, the need for more tests. Vaccines  Your health care provider may recommend certain vaccines, such as:  Influenza vaccine. This is recommended every year.  Tetanus, diphtheria, and acellular pertussis (Tdap, Td) vaccine. You may need a Td booster every 10 years.  Zoster vaccine. You may need this after age 62.  Pneumococcal 13-valent conjugate (PCV13) vaccine. One dose is recommended after age 42.  Pneumococcal polysaccharide (PPSV23) vaccine. One dose is recommended after age 2. Talk to your health care provider about which screenings and vaccines you need and how often you need them. This information is not intended to replace advice given to you by your health care provider. Make sure you discuss any questions you have with your health care provider. Document Released: 12/22/2015 Document Revised: 08/14/2016 Document Reviewed: 09/26/2015 Elsevier Interactive Patient Education  2017 [employer] Prevention in the Home Falls can cause injuries. They can happen to people of all ages. There are many things you can do to make your home safe and to help prevent falls. What can I do on the outside of my home?  Regularly fix the edges of walkways and driveways and fix any cracks.  Remove anything that might make you trip as you walk through a door,  such as a raised step or threshold.  Trim any bushes or trees on the path to your home.  Use bright outdoor lighting.  Clear any walking paths of anything that might make someone trip, such as rocks or tools.  Regularly check to see if handrails are loose or broken. Make sure that both sides of any steps have handrails.  Any raised decks and porches should have guardrails on the edges.  Have any leaves, snow, or ice cleared regularly.  Use sand or salt on walking paths during winter.  Clean up any spills in your garage right away. This includes oil or grease spills. What can I do in the bathroom?  Use night lights.  Install grab bars by the toilet and in the tub and shower. Do not use towel bars as grab bars.  Use non-skid mats or decals in the tub or shower.  If you need to sit down in the shower, use a plastic, non-slip stool.  Keep the floor dry. Clean up any water that spills on the floor as soon as it happens.  Remove soap buildup in the tub or  shower regularly.  Attach bath mats securely with double-sided non-slip rug tape.  Do not have throw rugs and other things on the floor that can make you trip. What can I do in the bedroom?  Use night lights.  Make sure that you have a light by your bed that is easy to reach.  Do not use any sheets or blankets that are too big for your bed. They should not hang down onto the floor.  Have a firm chair that has side arms. You can use this for support while you get dressed.  Do not have throw rugs and other things on the floor that can make you trip. What can I do in the kitchen?  Clean up any spills right away.  Avoid walking on wet floors.  Keep items that you use a lot in easy-to-reach places.  If you need to reach something above you, use a strong step stool that has a grab bar.  Keep electrical cords out of the way.  Do not use floor polish or wax that makes floors slippery. If you must use wax, use non-skid  floor wax.  Do not have throw rugs and other things on the floor that can make you trip. What can I do with my stairs?  Do not leave any items on the stairs.  Make sure that there are handrails on both sides of the stairs and use them. Fix handrails that are broken or loose. Make sure that handrails are as long as the stairways.  Check any carpeting to make sure that it is firmly attached to the stairs. Fix any carpet that is loose or worn.  Avoid having throw rugs at the top or bottom of the stairs. If you do have throw rugs, attach them to the floor with carpet tape.  Make sure that you have a light switch at the top of the stairs and the bottom of the stairs. If you do not have them, ask someone to add them for you. What else can I do to help prevent falls?  Wear shoes that:  Do not have high heels.  Have rubber bottoms.  Are comfortable and fit you well.  Are closed at the toe. Do not wear sandals.  If you use a stepladder:  Make sure that it is fully opened. Do not climb a closed stepladder.  Make sure that both sides of the stepladder are locked into place.  Ask someone to hold it for you, if possible.  Clearly mark and make sure that you can see:  Any grab bars or handrails.  First and last steps.  Where the edge of each step is.  Use tools that help you move around (mobility aids) if they are needed. These include:  Canes.  Walkers.  Scooters.  Crutches.  Turn on the lights when you go into a dark area. Replace any light bulbs as soon as they burn out.  Set up your furniture so you have a clear path. Avoid moving your furniture around.  If any of your floors are uneven, fix them.  If there are any pets around you, be aware of where they are.  Review your medicines with your doctor. Some medicines can make you feel dizzy. This can increase your chance of falling. Ask your doctor what other things that you can do to help prevent falls. This  information is not intended to replace advice given to you by your health care provider. Make sure you discuss any  questions you have with your health care provider. Document Released: 09/21/2009 Document Revised: 05/02/2016 Document Reviewed: 12/30/2014 Elsevier Interactive Patient Education  2017 [employer].

## 2020-11-24 NOTE — Progress Notes (Signed)
Subjective:   Sara Mills is a 69 y.o. female who presents for Medicare Annual (Subsequent) preventive examination.  Review of Systems    No ROS.  Medicare Wellness Virtual Visit.     Cardiac Risk Factors include: advanced age (>49men, >63 women);diabetes mellitus;hypertension     Objective:    Today's Vitals   11/24/20 1103  BP: 124/70  Pulse: 77  Weight: 148 lb (67.1 kg)  Height: 5\' 1"  (1.549 m)   Body mass index is 27.96 kg/m.  Advanced Directives 11/24/2020 11/24/2019  Does Patient Have a Medical Advance Directive? No No  Would patient like information on creating a medical advance directive? - Yes (MAU/Ambulatory/Procedural Areas - Information given)    Current Medications (verified) Outpatient Encounter Medications as of 11/24/2020  Medication Sig  . Accu-Chek FastClix Lancets MISC Use to check blood sugars twice daily. E11.9.  . diclofenac (VOLTAREN) 75 MG EC tablet TAKE 1 TABLET BY MOUTH TWICE DAILY  . glipiZIDE (GLUCOTROL) 5 MG tablet TAKE 1/2 TABLET (2.5 MG TOTAL) Before breakfast and 1 tablet (5 mg ) by mouth before dinner  . glucose blood test strip Use to check blood sugars twice daily. E11.9. Accu-Chek  Guide Test Strips ( Rx [id number])  . losartan-hydrochlorothiazide (HYZAAR) 50-12.5 MG tablet Take 1 tablet by mouth daily.  . metFORMIN (GLUCOPHAGE) 1000 MG tablet TAKE 1 TABLET (1,000 MG TOTAL) BY MOUTH 2 (TWO) TIMES DAILY WITH A MEAL.  . montelukast (SINGULAIR) 10 MG tablet TAKE 1 TABLET BY MOUTH AT BEDTIME  . omeprazole (PRILOSEC) 40 MG capsule Take 1 capsule (40 mg total) by mouth daily.  . rosuvastatin (CRESTOR) 20 MG tablet TAKE 1 TABLET BY MOUTH ONCE DAILY  . traMADol (ULTRAM) 50 MG tablet TAKE 1 TABLET BY MOUTH EVERY 6 HOURS AS NEEDED  . valACYclovir (VALTREX) 500 MG tablet Take 1 tablet (500 mg total) by mouth 3 (three) times daily. (Patient not taking: Reported on 10/04/2020)  . zolpidem (AMBIEN) 10 MG tablet Take 1 tablet (10 mg total) by mouth  at bedtime as needed. for sleep   No facility-administered encounter medications on file as of 11/24/2020.    Allergies (verified) Lisinopril   History: Past Medical History:  Diagnosis Date  . Angioedema 06/08/2017  . Diabetes mellitus   . GERD (gastroesophageal reflux disease)   . Hyperlipidemia   . Hypertension    Past Surgical History:  Procedure Laterality Date  . ABDOMINAL HYSTERECTOMY  1995    and BSO  . APPENDECTOMY    . BILATERAL OOPHORECTOMY    . BUNIONECTOMY    . CARPAL TUNNEL RELEASE Right   . CESAREAN SECTION  1977 and 1978   Family History  Problem Relation Age of Onset  . Stroke Mother   . Heart murmur Mother   . Mitral valve prolapse Mother   . Cancer Father 10       carcinoid syndrome  . Stomach cancer Father   . Breast cancer Neg Hx    Social History   Socioeconomic History  . Marital status: Married    Spouse name: Not on file  . Number of children: Not on file  . Years of education: Not on file  . Highest education level: Not on file  Occupational History  . Not on file  Tobacco Use  . Smoking status: Never Smoker  . Smokeless tobacco: Never Used  Substance and Sexual Activity  . Alcohol use: No  . Drug use: No  . Sexual activity: Not on file  Other Topics Concern  . Not on file  Social History Narrative  . Not on file   Social Determinants of Health   Financial Resource Strain: Low Risk   . Difficulty of Paying Living Expenses: Not hard at all  Food Insecurity: No Food Insecurity  . Worried About Charity fundraiser in the Last Year: Never true  . Ran Out of Food in the Last Year: Never true  Transportation Needs: No Transportation Needs  . Lack of Transportation (Medical): No  . Lack of Transportation (Non-Medical): No  Physical Activity: Unknown  . Days of Exercise per Week: 0 days  . Minutes of Exercise per Session: Not on file  Stress: No Stress Concern Present  . Feeling of Stress : Not at all  Social Connections:  Unknown  . Frequency of Communication with Friends and Family: Not on file  . Frequency of Social Gatherings with Friends and Family: Not on file  . Attends Religious Services: Not on file  . Active Member of Clubs or Organizations: Not on file  . Attends Archivist Meetings: Not on file  . Marital Status: Married    Tobacco Counseling Counseling given: Not Answered   Clinical Intake:  Pre-visit preparation completed: Yes        Diabetes: Yes (Followed by PCP)  How often do you need to have someone help you when you read instructions, pamphlets, or other written materials from your doctor or pharmacy?: 1 - Never  Nutrition Risk Assessment: Has the patient had any N/V/D within the last 2 weeks?  No  Does the patient have any non-healing wounds?  No  Has the patient had any unintentional weight loss or weight gain?  No   Diabetes: If diabetic, was a CBG obtained today?  FBS 137.  Did the patient bring in their glucometer from home?  No  How often do you monitor your CBG's? Random.   Financial Strains and Diabetes Management: Are you having any financial strains with the device, your supplies or your medication? No .  Does the patient want to be seen by Chronic Care Management for management of their diabetes?  No  Would the patient like to be referred to a Nutritionist or for Diabetic Management?  No   Diabetic Exams: Diabetic Eye Exam: Completed 01/10/20 Diabetic Foot Exam: Completed 10/04/20  Interpreter Needed?: No      Activities of Daily Living In your present state of health, do you have any difficulty performing the following activities: 11/24/2020  Hearing? N  Vision? N  Difficulty concentrating or making decisions? N  Walking or climbing stairs? N  Dressing or bathing? N  Doing errands, shopping? N  Preparing Food and eating ? N  Using the Toilet? N  In the past six months, have you accidently leaked urine? N  Do you have problems with loss  of bowel control? N  Managing your Medications? N  Managing your Finances? N  Housekeeping or managing your Housekeeping? N  Some recent data might be hidden    Patient Care Team: Crecencio Mc, MD as PCP - General (Internal Medicine)  Indicate any recent Medical Services you may have received from other than [facility] providers in the past year (date may be approximate).     Assessment:   This is a routine wellness examination for Gabby.  I connected with Tyrone today by telephone and verified that I am speaking with the correct person using two identifiers. Location patient: home Location provider:  work Persons participating in the virtual visit: patient, Marine scientist.    I discussed the limitations, risks, security and privacy concerns of performing an evaluation and management service by telephone and the availability of in person appointments. The patient expressed understanding and verbally consented to this telephonic visit.    Interactive audio and video telecommunications were attempted between this provider and patient, however failed, due to patient having technical difficulties OR patient did not have access to video capability.  We continued and completed visit with audio only.  Some vital signs may be absent or patient reported.   Hearing/Vision screen  Hearing Screening   125Hz  250Hz  500Hz  1000Hz  2000Hz  3000Hz  4000Hz  6000Hz  8000Hz   Right ear:           Left ear:           Comments: Patient is able to hear conversational tones without difficulty.  No issues reported.  Vision Screening Comments: Wears corrective lenses Visual acuity not assessed, virtual visit.  They have seen their ophthalmologist in the last 12 months.     Dietary issues and exercise activities discussed: Current Exercise Habits: Home exercise routine, Intensity: Mild  Low carb diet Good water intake  Goals      Patient Stated   .  Increase physical activity (pt-stated)      150 minutes  exercise weekly, 5 days/30 minutes      Other   .  Low carb diet      Depression Screen PHQ 2/9 Scores 11/24/2020 11/24/2019 02/17/2019 12/17/2017 12/04/2016 01/10/2014  PHQ - 2 Score 0 0 0 0 0 0  PHQ- 9 Score - - 3 9 - -    Fall Risk Fall Risk  11/24/2020 10/04/2020 05/24/2020 11/24/2019 09/03/2019  Falls in the past year? 0 0 0 0 0  Number falls in past yr: 0 - - - -  Injury with Fall? 0 - - - -  Follow up Falls evaluation completed Falls evaluation completed Falls evaluation completed Falls prevention discussed Falls evaluation completed    [place]: Handrails in use when climbing stairs? yes Home free of loose throw rugs in walkways, pet beds, electrical cords, etc? Yes  Adequate lighting in your home to reduce risk of falls? Yes   ASSISTIVE DEVICES UTILIZED TO PREVENT FALLS: Use of a cane, walker or w/c? No   TIMED UP AND GO: Was the test performed? No . Virtual visit.   Cognitive Function: Patient is alert and oriented x3.  Denies difficulty focusing, making decisions, memory loss.  Enjoys brain challenging exercises via phone.  MMSE/6CIT deferred. Normal by direct communication/observation.      6CIT Screen 11/24/2020 11/24/2019  What Year? 0 points 0 points  What month? 0 points 0 points  What time? 0 points 0 points  Count back from 20 0 points 0 points  Months in reverse 0 points 0 points  Repeat phrase 0 points 0 points  Total Score 0 0    Immunizations Immunization History  Administered Date(s) Administered  . Fluad Quad(high Dose 65+) 09/03/2019, 10/02/2020  . Hep A / Hep B 07/13/2019, 09/06/2019, 02/11/2020  . Influenza Split 10/20/2013  . Influenza, High Dose Seasonal PF 09/28/2018  . Influenza,inj,Quad PF,6+ Mos 08/12/2014, 09/21/2016, 09/08/2017  . Influenza-Unspecified 09/08/2012, 09/22/2015  . PFIZER SARS-COV-2 Vaccination 01/01/2020, 01/22/2020, 10/02/2020  . Pneumococcal Conjugate-13 08/12/2014  . Pneumococcal  Polysaccharide-23 09/15/2015  . Td 05/01/1998  . Tdap 01/10/2014  . Zoster Recombinat (Shingrix) 12/21/2018, 02/22/2019  Pneumococcal vaccine- deferred per patient preference.   Health Maintenance Health Maintenance  Topic Date Due  . MAMMOGRAM  01/20/2020  . PNA vac Low Risk Adult (2 of 2 - PPSV23) 11/24/2021 (Originally 09/14/2020)  . OPHTHALMOLOGY EXAM  01/09/2021  . COLONOSCOPY  03/20/2021  . HEMOGLOBIN A1C  04/04/2021  . FOOT EXAM  10/04/2021  . TETANUS/TDAP  01/11/2024  . INFLUENZA VACCINE  Completed  . DEXA SCAN  Completed  . COVID-19 Vaccine  Completed  . Hepatitis C Screening  Completed   Colorectal cancer screening: Type of screening: Cologuard. Completed 03/21/11. Repeat every 03/20/21 years.  Mammogram status: Completed 01/19/19. Repeat every year. 3D Screen Bilateral.   Bone Density- 01/19/19. Repeat 5 years.   Lung Cancer Screening: (Low Dose CT Chest recommended if Age 34-80 years, 30 pack-year currently smoking OR have quit w/in 15years.) does not qualify.   Hepatitis C Screening: Completed 12/17/17.  Vision Screening: Recommended annual ophthalmology exams for early detection of glaucoma and other disorders of the eye.  Dental Screening: Recommended annual dental exams for proper oral hygiene.  Community Resource Referral / Chronic Care Management: CRR required this visit?  No   CCM required this visit?  No      Plan:   Keep all routine maintenance appointments.   Follow up 04/04/21 @ 10:00.  I have personally reviewed and noted the following in the patient's chart:   . Medical and social history . Use of alcohol, tobacco or illicit drugs  . Current medications and supplements . Functional ability and status . Nutritional status . Physical activity . Advanced directives . List of other physicians . Hospitalizations, surgeries, and ER visits in previous 12 months . Vitals . Screenings to include cognitive, depression, and falls . Referrals and  appointments  In addition, I have reviewed and discussed with patient certain preventive protocols, quality metrics, and best practice recommendations. A written personalized care plan for preventive services as well as general preventive health recommendations were provided to patient via mychart.     Varney Biles, LPN   [phone]

## 2020-11-29 ENCOUNTER — Other Ambulatory Visit: Payer: Self-pay | Admitting: Internal Medicine

## 2020-11-29 NOTE — Telephone Encounter (Signed)
Refilled: 05/24/2020 Last OV: 10/04/2020 Next OV: 04/04/2021

## 2020-12-22 ENCOUNTER — Telehealth: Payer: Self-pay | Admitting: Internal Medicine

## 2020-12-22 NOTE — Telephone Encounter (Signed)
Rejection Reason - Other - 10/04/20 LVM 10/18/20 LVM Colon due 10/09/22" [facility] said on Dec 21, 2020 4:57 PM

## 2021-01-18 ENCOUNTER — Ambulatory Visit
Admission: RE | Admit: 2021-01-18 | Discharge: 2021-01-18 | Disposition: A | Discharge status: Home / Self Care | Payer: Medicare Other | Source: Ambulatory Visit | Attending: Internal Medicine | Admitting: Internal Medicine

## 2021-01-18 ENCOUNTER — Other Ambulatory Visit: Payer: Self-pay

## 2021-01-18 DIAGNOSIS — Z1231 Encounter for screening mammogram for malignant neoplasm of breast: Secondary | ICD-10-CM | POA: Insufficient documentation

## 2021-01-23 ENCOUNTER — Other Ambulatory Visit: Payer: Self-pay | Admitting: Internal Medicine

## 2021-01-23 DIAGNOSIS — R921 Mammographic calcification found on diagnostic imaging of breast: Secondary | ICD-10-CM

## 2021-01-23 DIAGNOSIS — R928 Other abnormal and inconclusive findings on diagnostic imaging of breast: Secondary | ICD-10-CM

## 2021-01-31 ENCOUNTER — Other Ambulatory Visit: Payer: Self-pay | Admitting: Internal Medicine

## 2021-02-02 ENCOUNTER — Ambulatory Visit
Admission: RE | Admit: 2021-02-02 | Discharge: 2021-02-02 | Disposition: A | Discharge status: Home / Self Care | Payer: Medicare Other | Source: Ambulatory Visit | Attending: Internal Medicine | Admitting: Internal Medicine

## 2021-02-02 ENCOUNTER — Other Ambulatory Visit: Payer: Self-pay

## 2021-02-02 DIAGNOSIS — R921 Mammographic calcification found on diagnostic imaging of breast: Secondary | ICD-10-CM | POA: Insufficient documentation

## 2021-02-02 DIAGNOSIS — R928 Other abnormal and inconclusive findings on diagnostic imaging of breast: Secondary | ICD-10-CM | POA: Diagnosis not present

## 2021-02-03 ENCOUNTER — Encounter: Payer: Self-pay | Admitting: Internal Medicine

## 2021-02-03 DIAGNOSIS — N6310 Unspecified lump in the right breast, unspecified quadrant: Secondary | ICD-10-CM | POA: Insufficient documentation

## 2021-02-03 DIAGNOSIS — R921 Mammographic calcification found on diagnostic imaging of breast: Secondary | ICD-10-CM | POA: Insufficient documentation

## 2021-02-05 ENCOUNTER — Telehealth: Payer: Self-pay | Admitting: Internal Medicine

## 2021-02-05 ENCOUNTER — Other Ambulatory Visit: Payer: Self-pay | Admitting: Internal Medicine

## 2021-02-05 DIAGNOSIS — R921 Mammographic calcification found on diagnostic imaging of breast: Secondary | ICD-10-CM

## 2021-02-05 DIAGNOSIS — R928 Other abnormal and inconclusive findings on diagnostic imaging of breast: Secondary | ICD-10-CM

## 2021-02-05 NOTE — Telephone Encounter (Signed)
Yes I am fine with dr Leontine Locket

## 2021-02-05 NOTE — Telephone Encounter (Signed)
Patient called to let Dr.Tullo know that she has to have a biopsy

## 2021-02-05 NOTE — Telephone Encounter (Signed)
Spoke with pt and she stated that she is okay with going ahead with Dr. Leontine Locket but wanted to make sure that was fine with you and there was not some reason she needed to have a surgeon do it.

## 2021-02-06 NOTE — Telephone Encounter (Signed)
Left detailed message with pt letting her know that it is fine to go forward with the biopsy with Dr. Leontine Locket.

## 2021-02-09 ENCOUNTER — Other Ambulatory Visit: Payer: Self-pay

## 2021-02-09 MED ORDER — METFORMIN HCL 1000 MG PO TABS: 1000.0000 mg | ORAL_TABLET | Freq: Two times a day (BID) | ORAL | 1 refills | Status: DC

## 2021-02-15 ENCOUNTER — Ambulatory Visit
Admission: RE | Admit: 2021-02-15 | Discharge: 2021-02-15 | Disposition: A | Discharge status: Home / Self Care | Payer: Medicare Other | Source: Ambulatory Visit | Attending: Internal Medicine | Admitting: Internal Medicine

## 2021-02-15 ENCOUNTER — Other Ambulatory Visit: Payer: Self-pay

## 2021-02-15 DIAGNOSIS — R928 Other abnormal and inconclusive findings on diagnostic imaging of breast: Secondary | ICD-10-CM | POA: Diagnosis not present

## 2021-02-15 DIAGNOSIS — R921 Mammographic calcification found on diagnostic imaging of breast: Secondary | ICD-10-CM | POA: Diagnosis not present

## 2021-02-15 HISTORY — PX: BREAST BIOPSY: SHX20

## 2021-02-15 MED ORDER — LOSARTAN POTASSIUM-HCTZ 50-12.5 MG PO TABS: 1.0000 | ORAL_TABLET | Freq: Every day | ORAL | 5 refills | Status: DC

## 2021-02-15 MED ORDER — ROSUVASTATIN CALCIUM 20 MG PO TABS: 20.0000 mg | ORAL_TABLET | Freq: Every day | ORAL | 5 refills | Status: DC

## 2021-02-15 MED ORDER — METFORMIN HCL 1000 MG PO TABS: 1000.0000 mg | ORAL_TABLET | Freq: Two times a day (BID) | ORAL | 5 refills | Status: DC

## 2021-02-15 MED ORDER — MONTELUKAST SODIUM 10 MG PO TABS: 10.0000 mg | ORAL_TABLET | Freq: Every day | ORAL | 5 refills | Status: DC

## 2021-02-15 MED ORDER — OMEPRAZOLE 40 MG PO CPDR: 40.0000 mg | DELAYED_RELEASE_CAPSULE | Freq: Every day | ORAL | 5 refills | Status: DC

## 2021-02-16 LAB — SURGICAL PATHOLOGY

## 2021-02-26 LAB — HM DIABETES EYE EXAM

## 2021-03-17 ENCOUNTER — Other Ambulatory Visit: Payer: Self-pay | Admitting: Internal Medicine

## 2021-03-27 DIAGNOSIS — M542 Cervicalgia: Secondary | ICD-10-CM | POA: Diagnosis not present

## 2021-03-27 DIAGNOSIS — M5416 Radiculopathy, lumbar region: Secondary | ICD-10-CM | POA: Diagnosis not present

## 2021-03-31 DIAGNOSIS — M5412 Radiculopathy, cervical region: Secondary | ICD-10-CM | POA: Diagnosis not present

## 2021-04-04 ENCOUNTER — Other Ambulatory Visit: Payer: Self-pay

## 2021-04-04 ENCOUNTER — Telehealth: Payer: Self-pay | Admitting: Internal Medicine

## 2021-04-04 ENCOUNTER — Encounter: Payer: Self-pay | Admitting: Internal Medicine

## 2021-04-04 ENCOUNTER — Ambulatory Visit (INDEPENDENT_AMBULATORY_CARE_PROVIDER_SITE_OTHER): Payer: Medicare Other | Admitting: Internal Medicine

## 2021-04-04 VITALS — BP 150/84 | HR 66 | Temp 97.7°F | Resp 16 | Ht 61.0 in | Wt 154.4 lb

## 2021-04-04 DIAGNOSIS — M5412 Radiculopathy, cervical region: Secondary | ICD-10-CM

## 2021-04-04 DIAGNOSIS — E041 Nontoxic single thyroid nodule: Secondary | ICD-10-CM

## 2021-04-04 DIAGNOSIS — K760 Fatty (change of) liver, not elsewhere classified: Secondary | ICD-10-CM | POA: Diagnosis not present

## 2021-04-04 DIAGNOSIS — E785 Hyperlipidemia, unspecified: Secondary | ICD-10-CM

## 2021-04-04 DIAGNOSIS — I10 Essential (primary) hypertension: Secondary | ICD-10-CM

## 2021-04-04 DIAGNOSIS — E663 Overweight: Secondary | ICD-10-CM | POA: Diagnosis not present

## 2021-04-04 DIAGNOSIS — N6310 Unspecified lump in the right breast, unspecified quadrant: Secondary | ICD-10-CM | POA: Diagnosis not present

## 2021-04-04 DIAGNOSIS — R748 Abnormal levels of other serum enzymes: Secondary | ICD-10-CM | POA: Diagnosis not present

## 2021-04-04 DIAGNOSIS — E119 Type 2 diabetes mellitus without complications: Secondary | ICD-10-CM

## 2021-04-04 DIAGNOSIS — E559 Vitamin D deficiency, unspecified: Secondary | ICD-10-CM

## 2021-04-04 DIAGNOSIS — R9389 Abnormal findings on diagnostic imaging of other specified body structures: Secondary | ICD-10-CM | POA: Diagnosis not present

## 2021-04-04 DIAGNOSIS — E669 Obesity, unspecified: Secondary | ICD-10-CM

## 2021-04-04 DIAGNOSIS — E1169 Type 2 diabetes mellitus with other specified complication: Secondary | ICD-10-CM | POA: Diagnosis not present

## 2021-04-04 DIAGNOSIS — M130 Polyarthritis, unspecified: Secondary | ICD-10-CM | POA: Diagnosis not present

## 2021-04-04 LAB — COMPREHENSIVE METABOLIC PANEL
ALT: 51 U/L — ABNORMAL HIGH (ref 0–35)
AST: 51 U/L — ABNORMAL HIGH (ref 0–37)
Albumin: 4.6 g/dL (ref 3.5–5.2)
Alkaline Phosphatase: 89 U/L (ref 39–117)
BUN: 25 mg/dL — ABNORMAL HIGH (ref 6–23)
CO2: 29 mEq/L (ref 19–32)
Calcium: 9.9 mg/dL (ref 8.4–10.5)
Chloride: 101 mEq/L (ref 96–112)
Creatinine, Ser: 1.46 mg/dL — ABNORMAL HIGH (ref 0.40–1.20)
GFR: 36.97 mL/min — ABNORMAL LOW (ref >60.00)
Glucose, Bld: 119 mg/dL — ABNORMAL HIGH (ref 70–99)
Potassium: 4 mEq/L (ref 3.5–5.1)
Sodium: 140 mEq/L (ref 135–145)
Total Bilirubin: 0.5 mg/dL (ref 0.2–1.2)
Total Protein: 7.3 g/dL (ref 6.0–8.3)

## 2021-04-04 LAB — MICROALBUMIN / CREATININE URINE RATIO
Creatinine,U: 190.8 mg/dL
Microalb Creat Ratio: 1.2 mg/g (ref 0.0–30.0)
Microalb, Ur: 2.3 mg/dL — ABNORMAL HIGH (ref 0.0–1.9)

## 2021-04-04 LAB — VITAMIN D 25 HYDROXY (VIT D DEFICIENCY, FRACTURES): VITD: 31.12 ng/mL (ref 30.00–100.00)

## 2021-04-04 LAB — SEDIMENTATION RATE: Sed Rate: 14 mm/hr (ref 0–30)

## 2021-04-04 LAB — HEMOGLOBIN A1C: Hgb A1c MFr Bld: 7.4 % — ABNORMAL HIGH (ref 4.6–6.5)

## 2021-04-04 NOTE — Assessment & Plan Note (Addendum)
Continued loss of control,  But having daily diarrhea.  Advised to increase glipizide to 5 mg twice daily suspend metformin for 1-2 weeks to rule out drug induced diarrhea

## 2021-04-04 NOTE — Telephone Encounter (Signed)
[facility] called and said they needed a patient signed ROI form faxed before releasing any information. Fax number is [phone]

## 2021-04-04 NOTE — Patient Instructions (Addendum)
For pain control:  1) Max out the tylenol first.  You can add up to 2000 mg of acetominophen (tylenol) every day safely  In divided doses (500 mg every 6 hours  Or 1000 mg every 12 hours.)  2) Continue diclofenac as you anti inflammatory  Twice daily    3) You May increase gabapentin to three times daily  If tolerated.  This should help the sciatica    4)  Add tramadol for severe pain, , up to 4 times  daily if needed.      Suspend metformin for 2 weeks to see if diarrhea resolves

## 2021-04-04 NOTE — Progress Notes (Signed)
Subjective:  Patient ID: Sara Mills, female    DOB: [birth year]  Age: 70 y.o. MRN: [id number]  CC: The primary encounter diagnosis was Calcifying cyst of thyroid. Diagnoses of Abnormal imaging of thyroid, Polyarthritis, Hepatic steatosis, Diabetes mellitus type 2 in obese ([place]), Vitamin D deficiency, Cervical radiculopathy, Elevated liver enzymes, Hyperlipidemia due to type 2 diabetes mellitus ([place]), Overweight (BMI 25.0-29.9), THYROID NODULE, Essential hypertension, and Breast mass, right were also pertinent to this visit.  HPI ARILLA HICE presents for follow up on type 2 DM with hypertension, and  fatty liver .   This visit occurred during the SARS-CoV-2 public health emergency.  Safety protocols were in place, including screening questions prior to the visit, additional usage of staff PPE, and extensive cleaning of exam room while observing appropriate contact time as indicated for disinfecting solutions.   She has been seeing [facility]  For persistent left hip, back, knee, and  Right sided Neck pain. She reports that she has been pretty miserable, and for the last several months her  arms going numb at night.    Taking gabapentin and rare use of tramadol    T2DM:  She  feels generally unwell,  And  is not  exercising regularly due to joint pain . Not cChecking  blood sugars regularly,  usually only if she feels she may be having a hypoglycemic event. .  BS have been under 140 fasting and < 170 post prandially.  Denies any recent hypoglyemic events.  Taking glipizide (2.5 mg  bid) and metformin  (1000 mg bid ) as directed, but having frequent loose stools.  . Following a carbohydrate modified diet 6 days per week. Denies numbness, burning and tingling of extremities. Appetite is good.   Thyroid calcifications:  Her orthopedist noted this on plain cervical films and has requested workup.  Thyroid panel with antibodies ordered.   Lab Results  Component Value Date   MICROALBUR 2.3 (H)  04/04/2021   MICROALBUR 25.5 (H) 05/23/2020      Thyroid looked calcified on cervical spine films,  Workup needed . MRI cervical spine ordered.   BP at home always under 130/80   Type 2 DM:  Morning fasting 95 to 120  . p ost prandials under  175.  Taking 1000 mg metformin bid   And glipizide   1/2 pill in am and 1 pill at night. Thinks it is causing loose stools.   Outpatient Medications Prior to Visit  Medication Sig Dispense Refill  . Accu-Chek FastClix Lancets MISC Use to check blood sugars twice daily. E11.9. 102 each 11  . diclofenac (VOLTAREN) 75 MG EC tablet TAKE 1 TABLET BY MOUTH TWICE DAILY 60 tablet 2  . gabapentin (NEURONTIN) 300 MG capsule Take 300 mg by mouth 2 (two) times daily.    [name] glucose blood test strip Use to check blood sugars twice daily. E11.9. Accu-Chek  Guide Test Strips ( Rx [id number]) 100 each 12  . losartan-hydrochlorothiazide (HYZAAR) 50-12.5 MG tablet Take 1 tablet by mouth daily. 30 tablet 5  . metFORMIN (GLUCOPHAGE) 1000 MG tablet Take 1 tablet (1,000 mg total) by mouth 2 (two) times daily with a meal. 60 tablet 5  . montelukast (SINGULAIR) 10 MG tablet Take 1 tablet (10 mg total) by mouth at bedtime. 30 tablet 5  . omeprazole (PRILOSEC) 40 MG capsule Take 1 capsule (40 mg total) by mouth daily. 30 capsule 5  . rosuvastatin (CRESTOR) 20 MG tablet Take 1 tablet (20 mg  total) by mouth daily. 30 tablet 5  . traMADol (ULTRAM) 50 MG tablet TAKE 1 TABLET BY MOUTH EVERY 6 HOURS AS NEEDED 30 tablet 0  . valACYclovir (VALTREX) 500 MG tablet Take 1 tablet (500 mg total) by mouth 3 (three) times daily. 21 tablet 2  . zolpidem (AMBIEN) 10 MG tablet TAKE 1 TABLET (10 MG TOTAL) BY MOUTH AT BEDTIME AS NEEDED. FOR SLEEP 30 tablet 5  . glipiZIDE (GLUCOTROL) 5 MG tablet TAKE 1/2 TABLET (2.5 MG TOTAL) BY MOUTH TWICE DAILY BEFORE A MEAL 90 tablet 1   No facility-administered medications prior to visit.    Review of Systems;  Patient denies headache, fevers, malaise,  unintentional weight loss, skin rash, eye pain, sinus congestion and sinus pain, sore throat, dysphagia,  hemoptysis , cough, dyspnea, wheezing, chest pain, palpitations, orthopnea, edema, abdominal pain, nausea, melena, diarrhea, constipation, flank pain, dysuria, hematuria, urinary  Frequency, nocturia, numbness, tingling, seizures,  Focal weakness, Loss of consciousness,  Tremor, insomnia, depression, anxiety, and suicidal ideation.      Objective:  BP (!) 150/84 (BP Location: Left Arm, Patient Position: Sitting, Cuff Size: Normal)   Pulse 66   Temp 97.7 F (36.5 C) (Oral)   Resp 16   Ht 5\' 1"  (1.549 m)   Wt 154 lb 6.4 oz (70 kg)   SpO2 99%   BMI 29.17 kg/m   BP Readings from Last 3 Encounters:  04/04/21 (!) 150/84  11/24/20 124/70  10/04/20 138/80    Wt Readings from Last 3 Encounters:  04/04/21 154 lb 6.4 oz (70 kg)  11/24/20 148 lb (67.1 kg)  10/04/20 148 lb 9.6 oz (67.4 kg)    General appearance: alert, cooperative and appears stated age Ears: normal TM's and external ear canals both ears Throat: lips, mucosa, and tongue normal; teeth and gums normal Neck: no adenopathy, no carotid bruit, supple, symmetrical, trachea midline and thyroid not enlarged, symmetric, no tenderness/mass/nodules Back: symmetric, no curvature. ROM normal. No CVA tenderness. Lungs: clear to auscultation bilaterally Heart: regular rate and rhythm, S1, S2 normal, no murmur, click, rub or gallop Abdomen: soft, non-tender; bowel sounds normal; no masses,  no organomegaly Pulses: 2+ and symmetric Skin: Skin color, texture, turgor normal. No rashes or lesions Lymph nodes: Cervical, supraclavicular, and axillary nodes normal.  Lab Results  Component Value Date   HGBA1C 7.4 (H) 04/04/2021   HGBA1C 7.3 (H) 10/04/2020   HGBA1C 6.8 (H) 05/23/2020    Lab Results  Component Value Date   CREATININE 1.46 (H) 04/04/2021   CREATININE 1.02 10/04/2020   CREATININE 0.87 05/23/2020    Lab Results   Component Value Date   WBC 5.2 11/26/2019   HGB 12.2 11/26/2019   HCT 37.0 11/26/2019   PLT 123.0 (L) 11/26/2019   GLUCOSE 119 (H) 04/04/2021   CHOL 140 10/04/2020   TRIG 246.0 (H) 10/04/2020   HDL 41.20 10/04/2020   LDLDIRECT 72.0 10/04/2020   LDLCALC 72 05/23/2020   ALT 51 (H) 04/04/2021   AST 51 (H) 04/04/2021   NA 140 04/04/2021   K 4.0 04/04/2021   CL 101 04/04/2021   CREATININE 1.46 (H) 04/04/2021   BUN 25 (H) 04/04/2021   CO2 29 04/04/2021   TSH 1.47 04/04/2021   HGBA1C 7.4 (H) 04/04/2021   MICROALBUR 2.3 (H) 04/04/2021    MM CLIP PLACEMENT RIGHT  Result Date: 02/15/2021 CLINICAL DATA:  Post procedure mammogram for clip placement. EXAM: DIAGNOSTIC RIGHT MAMMOGRAM POST STEREOTACTIC BIOPSY COMPARISON:  Previous exam(s). FINDINGS: Mammographic images  were obtained following stereotactic guided biopsy of calcifications in the upper inner right breast. The X biopsy marking clip is in expected position at the site of biopsy. IMPRESSION: Appropriate positioning of the X shaped biopsy marking clip at the site of biopsy in the upper inner right breast. Final Assessment: Post Procedure Mammograms for Marker Placement Electronically Signed   By: Audie Pinto M.D.   On: 02/15/2021 08:33   MM RT BREAST BX W LOC DEV 1ST LESION IMAGE BX SPEC STEREO GUIDE  Addendum Date: 02/16/2021   ADDENDUM REPORT: 02/16/2021 13:59 ADDENDUM: PATHOLOGY revealed: A. RIGHT BREAST, CALCIFICATIONS; STEREOTACTIC BIOPSY: - BENIGN BREAST TISSUE WITH FIBROADENOMATOID CHANGES AND ASSOCIATED CALCIFICATIONS. - NEGATIVE FOR ATYPIA AND MALIGNANCY. Pathology results are CONCORDANT with imaging findings, per Dr. Audie Pinto. Pathology results and recommendations below were discussed with patient by telephone on 02/16/2021. Patient reported biopsy site within normal limits with slight tenderness at the site, and no significant bruising. Post biopsy care instructions were reviewed, questions were answered and my  direct phone number was provided to patient. Patient was instructed to call [facility] if any concerns or questions arise related to the biopsy. Recommendation: Resume annual bilateral screening mammogram due February 2023. Pathology results reported by Electa Sniff RN on 02/16/2021. Electronically Signed   By: Audie Pinto M.D.   On: 02/16/2021 13:59   Result Date: 02/16/2021 CLINICAL DATA:  70 year old female presenting for biopsy of right breast calcifications. EXAM: RIGHT BREAST STEREOTACTIC CORE NEEDLE BIOPSY COMPARISON:  Previous exams. FINDINGS: The patient and I discussed the procedure of stereotactic-guided biopsy including benefits and alternatives. We discussed the high likelihood of a successful procedure. We discussed the risks of the procedure including infection, bleeding, tissue injury, clip migration, and inadequate sampling. Informed written consent was given. The usual time out protocol was performed immediately prior to the procedure. Using sterile technique and 1% Lidocaine as local anesthetic, under stereotactic guidance, a 9 gauge vacuum assisted device was used to perform core needle biopsy of calcifications in the upper inner right breast using a superior approach. Specimen radiograph was performed showing 2 specimens with calcifications. Specimens with calcifications are identified for pathology. Lesion quadrant: Upper inner quadrant At the conclusion of the procedure, an X tissue marker clip was deployed into the biopsy cavity. Follow-up 2-view mammogram was performed and dictated separately. IMPRESSION: Stereotactic-guided biopsy of calcifications in the upper inner right breast. No apparent complications. Electronically Signed: By: Audie Pinto M.D. On: 02/15/2021 08:34    Assessment & Plan:   Problem List Items Addressed This Visit      Unprioritized   Breast mass, right    She underwent breast biopsy several weeks ago,  Normal path. Resume annual screening  Feb 2023       Cervical radiculopathy    MRI cervical spine was recently done by [facility].  Results requested       Relevant Medications   gabapentin (NEURONTIN) 300 MG capsule   Diabetes mellitus type 2 in obese ([place])    Continued loss of control,  But having daily diarrhea.  Advised to increase glipizide to 5 mg twice daily suspend metformin for 1-2 weeks to rule out drug induced diarrhea       Relevant Medications   glipiZIDE (GLUCOTROL) 5 MG tablet   Other Relevant Orders   Hemoglobin A1c (Completed)   Comprehensive metabolic panel (Completed)   Microalbumin / creatinine urine ratio (Completed)   Elevated liver enzymes    Persistent elevation noted; hepatic  steatosis suggested by abd ultrasound done in 2020 for same. Recommend weight loss,  Will suspend statin for one month and repeat lfts       Relevant Orders   Comprehensive metabolic panel   Essential hypertension    Elevated today on  current regimen of losartan/hct . she reports compliance with medication regimen  but has an elevated reading today in office.  She is not using NSAIDs daily.  Discussed goal of 120/70  (130/80 for patients over 70)  to preserve renal function.  She has been asked to check her  BP  at home and  submit readings for evaluation. Renal function, electrolytes and screen for proteinuria are all normal ..      Hepatic steatosis   Hyperlipidemia due to type 2 diabetes mellitus ([facility])    LDL has been  at goal on rosuvastatin 20 mg daily .  Suspension of statin for 1 month  to recheck lfts recommended   Lab Results  Component Value Date   CHOL 140 10/04/2020   HDL 41.20 10/04/2020   LDLCALC 72 05/23/2020   LDLDIRECT 72.0 10/04/2020   TRIG 246.0 (H) 10/04/2020   CHOLHDL 3 10/04/2020   Lab Results  Component Value Date   ALT 51 (H) 04/04/2021   AST 51 (H) 04/04/2021   ALKPHOS 89 04/04/2021   BILITOT 0.5 04/04/2021   20       Relevant Medications   glipiZIDE (GLUCOTROL) 5 MG tablet    Overweight (BMI 25.0-29.9)    With diabetes, hypertension and fatty liver already diagnosed.  exercise currently hindered by joint pain       THYROID NODULE    Thyroid  calcs noted by orthopedics on cervical spine films recently. She has a known nontoxic MNG that was followed with serial imaging for years by ENT(last one in chart 2012)   Given her normal thyroid levels,  No further workup needed        Other Visit Diagnoses    Calcifying cyst of thyroid    -  Primary   Abnormal imaging of thyroid       Relevant Orders   Thyroid Panel With TSH (Completed)   Thyroid peroxidase antibody (Completed)   Thyroglobulin antibody (Completed)   Thyroglobulin Level (Completed)   Polyarthritis       Relevant Orders   Sedimentation rate (Completed)   Cyclic citrul peptide antibody, IgG (Completed)   Rheumatoid Factor (Completed)   Vitamin D deficiency       Relevant Orders   VITAMIN D 25 Hydroxy (Vit-D Deficiency, Fractures) (Completed)      I have changed Cera L. Akens's glipiZIDE. I am also having her maintain her valACYclovir, traMADol, glucose blood, Accu-Chek FastClix Lancets, zolpidem, diclofenac, losartan-hydrochlorothiazide, metFORMIN, montelukast, omeprazole, rosuvastatin, and gabapentin.  Meds ordered this encounter  Medications  . glipiZIDE (GLUCOTROL) 5 MG tablet    Sig: Take 1 tablet (5 mg total) by mouth 2 (two) times daily before a meal.    Dispense:  180 tablet    Refill:  1    .note dose increase KEEP ON FILE FOR FUTURE REFILLS    Medications Discontinued During This Encounter  Medication Reason  . glipiZIDE (GLUCOTROL) 5 MG tablet     Follow-up: Return in about 6 months (around 10/04/2021) for follow up diabetes.   Crecencio Mc, MD

## 2021-04-04 NOTE — Assessment & Plan Note (Signed)
MRI cervical spine was recently done by [facility].  Results requested

## 2021-04-05 DIAGNOSIS — E041 Nontoxic single thyroid nodule: Secondary | ICD-10-CM | POA: Diagnosis not present

## 2021-04-05 DIAGNOSIS — M4802 Spinal stenosis, cervical region: Secondary | ICD-10-CM | POA: Diagnosis not present

## 2021-04-05 LAB — THYROID PEROXIDASE ANTIBODY: Thyroperoxidase Ab SerPl-aCnc: 1 IU/mL (ref <9)

## 2021-04-05 LAB — CYCLIC CITRUL PEPTIDE ANTIBODY, IGG: Cyclic Citrullin Peptide Ab: <16 UNITS

## 2021-04-05 LAB — THYROGLOBULIN LEVEL: Thyroglobulin: 143.8 ng/mL — ABNORMAL HIGH

## 2021-04-05 LAB — THYROID PANEL WITH TSH
Free Thyroxine Index: 3.1 (ref 1.4–3.8)
T3 Uptake: 33 % (ref 22–35)
T4, Total: 9.3 ug/dL (ref 5.1–11.9)
TSH: 1.47 mIU/L (ref 0.40–4.50)

## 2021-04-05 LAB — THYROGLOBULIN ANTIBODY: Thyroglobulin Ab: <1 IU/mL (ref <=1)

## 2021-04-05 LAB — RHEUMATOID FACTOR: Rheumatoid fact SerPl-aCnc: <14 IU/mL (ref <14)

## 2021-04-05 MED ORDER — GLIPIZIDE 5 MG PO TABS: 5.0000 mg | ORAL_TABLET | Freq: Two times a day (BID) | ORAL | 1 refills | Status: DC

## 2021-04-05 NOTE — Assessment & Plan Note (Signed)
She underwent breast biopsy several weeks ago,  Normal path. Resume annual screening Feb 2023

## 2021-04-05 NOTE — Assessment & Plan Note (Signed)
Elevated today on  current regimen of losartan/hct . she reports compliance with medication regimen  but has an elevated reading today in office.  She is not using NSAIDs daily.  Discussed goal of 120/70  (130/80 for patients over 70)  to preserve renal function.  She has been asked to check her  BP  at home and  submit readings for evaluation. Renal function, electrolytes and screen for proteinuria are all normal ..

## 2021-04-05 NOTE — Assessment & Plan Note (Signed)
With diabetes, hypertension and fatty liver already diagnosed.  exercise currently hindered by joint pain

## 2021-04-05 NOTE — Progress Notes (Signed)
Correction to first message:  We discussed suspending metformin during your visit due to new onset diarrhea.  I still want you to suspend the metformin and INCREASE the glipzide to 5 mg (full tablet) twice daily.

## 2021-04-05 NOTE — Assessment & Plan Note (Signed)
Persistent elevation noted; hepatic steatosis suggested by abd ultrasound done in 2020 for same. Recommend weight loss,  Will suspend statin for one month and repeat lfts

## 2021-04-05 NOTE — Assessment & Plan Note (Signed)
LDL has been  at goal on rosuvastatin 20 mg daily .  Suspension of statin for 1 month  to recheck lfts recommended   Lab Results  Component Value Date   CHOL 140 10/04/2020   HDL 41.20 10/04/2020   LDLCALC 72 05/23/2020   LDLDIRECT 72.0 10/04/2020   TRIG 246.0 (H) 10/04/2020   CHOLHDL 3 10/04/2020   Lab Results  Component Value Date   ALT 51 (H) 04/04/2021   AST 51 (H) 04/04/2021   ALKPHOS 89 04/04/2021   BILITOT 0.5 04/04/2021   20

## 2021-04-05 NOTE — Assessment & Plan Note (Addendum)
Thyroid  calcs noted by orthopedics on cervical spine films recently. She has a known nontoxic MNG that was followed with serial imaging for years by ENT(last one in chart 2012)   Given her normal thyroid levels,  No further workup needed

## 2021-04-06 ENCOUNTER — Other Ambulatory Visit: Payer: Self-pay | Admitting: Physical Medicine and Rehabilitation

## 2021-04-06 DIAGNOSIS — E041 Nontoxic single thyroid nodule: Secondary | ICD-10-CM

## 2021-04-10 DIAGNOSIS — M4802 Spinal stenosis, cervical region: Secondary | ICD-10-CM | POA: Diagnosis not present

## 2021-04-10 DIAGNOSIS — M5416 Radiculopathy, lumbar region: Secondary | ICD-10-CM | POA: Diagnosis not present

## 2021-04-12 DIAGNOSIS — M5416 Radiculopathy, lumbar region: Secondary | ICD-10-CM | POA: Diagnosis not present

## 2021-04-12 DIAGNOSIS — M5412 Radiculopathy, cervical region: Secondary | ICD-10-CM | POA: Diagnosis not present

## 2021-04-17 ENCOUNTER — Other Ambulatory Visit: Payer: Medicare Other

## 2021-04-17 ENCOUNTER — Ambulatory Visit
Admission: RE | Admit: 2021-04-17 | Discharge: 2021-04-17 | Disposition: A | Discharge status: Home / Self Care | Payer: Medicare Other | Source: Ambulatory Visit | Attending: Physical Medicine and Rehabilitation | Admitting: Physical Medicine and Rehabilitation

## 2021-04-17 DIAGNOSIS — E041 Nontoxic single thyroid nodule: Secondary | ICD-10-CM | POA: Diagnosis not present

## 2021-05-01 ENCOUNTER — Other Ambulatory Visit: Payer: Self-pay | Admitting: Internal Medicine

## 2021-05-02 DIAGNOSIS — M5416 Radiculopathy, lumbar region: Secondary | ICD-10-CM | POA: Diagnosis not present

## 2021-05-02 DIAGNOSIS — M5412 Radiculopathy, cervical region: Secondary | ICD-10-CM | POA: Diagnosis not present

## 2021-05-08 DIAGNOSIS — M5412 Radiculopathy, cervical region: Secondary | ICD-10-CM | POA: Diagnosis not present

## 2021-05-08 DIAGNOSIS — M5416 Radiculopathy, lumbar region: Secondary | ICD-10-CM | POA: Diagnosis not present

## 2021-06-08 ENCOUNTER — Other Ambulatory Visit: Payer: Self-pay | Admitting: Internal Medicine

## 2021-06-15 DIAGNOSIS — Z20822 Contact with and (suspected) exposure to covid-19: Secondary | ICD-10-CM | POA: Diagnosis not present

## 2021-06-27 ENCOUNTER — Other Ambulatory Visit: Payer: Self-pay

## 2021-06-27 ENCOUNTER — Telehealth: Payer: Self-pay | Admitting: Internal Medicine

## 2021-06-27 ENCOUNTER — Other Ambulatory Visit: Payer: Self-pay | Admitting: Internal Medicine

## 2021-06-27 MED ORDER — ACCU-CHEK FASTCLIX LANCETS MISC: 1 refills | Status: DC

## 2021-06-27 MED ORDER — ACCU-CHEK GUIDE VI STRP: ORAL_STRIP | 1 refills | Status: DC

## 2021-06-27 NOTE — Telephone Encounter (Signed)
PT called to advise she needs a refill of the following meds. She had gone to pick them up but was told someone from our office had called and canceled it.   ACCU-CHEK GUIDE test strip Accu-Chek FastClix Lancets MISC

## 2021-06-27 NOTE — Telephone Encounter (Signed)
Medication has been refilled.

## 2021-06-27 NOTE — Telephone Encounter (Signed)
Called and spoke to Sara Mills. Sara Mills verbalized understanding and had no further questions.

## 2021-07-04 DIAGNOSIS — R42 Dizziness and giddiness: Secondary | ICD-10-CM | POA: Diagnosis not present

## 2021-07-17 ENCOUNTER — Other Ambulatory Visit: Payer: Self-pay

## 2021-07-17 ENCOUNTER — Telehealth: Payer: Self-pay | Admitting: Internal Medicine

## 2021-07-17 MED ORDER — ACCU-CHEK GUIDE VI STRP: ORAL_STRIP | 1 refills | Status: DC

## 2021-07-17 NOTE — Telephone Encounter (Signed)
[facility] needs a diagnoses code on hard copy of her glucose blood (ACCU-CHEK GUIDE) test strip  and faxed to [phone]

## 2021-07-26 ENCOUNTER — Other Ambulatory Visit: Payer: Self-pay | Admitting: Internal Medicine

## 2021-08-24 ENCOUNTER — Other Ambulatory Visit: Payer: Self-pay | Admitting: Internal Medicine

## 2021-08-28 ENCOUNTER — Telehealth: Payer: Self-pay | Admitting: Internal Medicine

## 2021-08-28 NOTE — Telephone Encounter (Signed)
Patient did a home covid test and it was negative.Patient has a low grade fever, cough, head and lung congestion, weak, body aches. No appointments available. Patient was transferred to [facility] [place].

## 2021-08-28 NOTE — Telephone Encounter (Signed)
Access nurse instructed patient to be evaluated. Also gave patient home care advise. Patient was transferred back to clinic. She stated she is going to try OTC medication if sx worsen or she feels up to it she will do a virtual UC visit through [employer].

## 2021-08-29 ENCOUNTER — Other Ambulatory Visit: Payer: Self-pay

## 2021-08-29 ENCOUNTER — Ambulatory Visit
Admission: RE | Admit: 2021-08-29 | Discharge: 2021-08-29 | Disposition: A | Discharge status: Home / Self Care | Payer: Medicare Other | Source: Ambulatory Visit | Attending: Emergency Medicine | Admitting: Emergency Medicine

## 2021-08-29 VITALS — BP 119/76 | HR 91 | Temp 99.8°F | Resp 22

## 2021-08-29 DIAGNOSIS — J22 Unspecified acute lower respiratory infection: Secondary | ICD-10-CM | POA: Diagnosis not present

## 2021-08-29 MED ORDER — DOXYCYCLINE HYCLATE 100 MG PO CAPS: 100.0000 mg | ORAL_CAPSULE | Freq: Two times a day (BID) | ORAL | 0 refills | Status: DC

## 2021-08-29 MED ORDER — ALBUTEROL SULFATE HFA 108 (90 BASE) MCG/ACT IN AERS: 1.0000 | INHALATION_SPRAY | Freq: Four times a day (QID) | RESPIRATORY_TRACT | 0 refills | Status: DC | PRN

## 2021-08-29 MED ORDER — BENZONATATE 100 MG PO CAPS: 100.0000 mg | ORAL_CAPSULE | Freq: Three times a day (TID) | ORAL | 0 refills | Status: DC

## 2021-08-29 NOTE — ED Provider Notes (Signed)
CHIEF COMPLAINT:   Chief Complaint  Patient presents with   Appointment     SUBJECTIVE/HPI:  HPI A very pleasant 70 y.o.Female presents today with cough, intermittent low-grade fever to 99.5 F, general aches and pains secondary to forceful constant cough.  Patient also reports head and chest congestion along with diarrhea.  Symptom onset Saturday.  Patient reports a history of pneumonia.  Patient reports that she has been able to cough out mucus, but states that this has since gone quite dry and she has not been able to cough out quite as much recently. Patient does not report any shortness of breath, chest pain, palpitations, visual changes, weakness, tingling, headache, nausea, vomiting, diarrhea, fever, chills.   has a past medical history of Angioedema (06/08/2017), Diabetes mellitus, GERD (gastroesophageal reflux disease), Hyperlipidemia, and Hypertension.  ROS:  Review of Systems See Subjective/HPI Medications, Allergies and Problem List personally reviewed in Epic today OBJECTIVE:   Vitals:   08/29/21 1510  BP: 119/76  Pulse: 91  Resp: (!) 22  Temp: 99.8 F (37.7 C)  SpO2: 95%    Physical Exam   General: Appears well-developed and well-nourished. No acute distress.  HEENT Head: Normocephalic and atraumatic.   Ears: Hearing grossly intact, no drainage or visible deformity.  Nose: No nasal deviation. Mouth/Throat: No stridor or tracheal deviation.  Non erythematous posterior pharynx noted with clear drainage present.  No white patchy exudate noted. Eyes: Conjunctivae and EOM are normal. No eye drainage or scleral icterus bilaterally.  Neck: Normal range of motion, neck is supple.  Cardiovascular: Normal rate. Regular rhythm; no murmurs, gallops, or rubs.  Pulm/Chest: No respiratory distress. Breath sounds normal bilaterally without wheezes, rhonchi, or rales.  Intermittent forceful cough noted. Neurological: Alert and oriented to person, place, and time.  Skin: Skin is  warm and dry.  No rashes, lesions, abrasions or bruising noted to skin.   Psychiatric: Normal mood, affect, behavior, and thought content.   Vital signs and nursing note reviewed.   Patient stable and cooperative with examination. PROCEDURES:    LABS/X-RAYS/EKG/MEDS:   No results found for any visits on 08/29/21.  MEDICAL DECISION MAKING:   Patient presents with cough, intermittent low-grade fever to 99.5 F, general aches and pains secondary to forceful constant cough.  Patient also reports head and chest congestion along with diarrhea.  Symptom onset Saturday.  Patient reports a history of pneumonia.  Patient reports that she has been able to cough out mucus, but states that this has since gone quite dry and she has not been able to cough out quite as much recently. Patient does not report any shortness of breath, chest pain, palpitations, visual changes, weakness, tingling, headache, nausea, vomiting, diarrhea, fever, chills.  Given symptoms along with assessment findings, likely lower respiratory tract infection.  Rx doxycycline, albuterol and Tessalon Perles to the patient's preferred pharmacy.  Advised about home treatment and care to include rest, fluids, Tylenol, ibuprofen and Mucinex.  Return to clinic for any new onset fever, difficulty breathing, chest pain, symptoms lasting longer than 3 to 4 weeks or bloody sputum.  Patient verbalized understanding and agreed with treatment plan.  Patient stable upon discharge. ASSESSMENT/PLAN:  1. Lower respiratory tract infection - doxycycline (VIBRAMYCIN) 100 MG capsule; Take 1 capsule (100 mg total) by mouth 2 (two) times daily.  Dispense: 20 capsule; Refill: 0 - albuterol (VENTOLIN HFA) 108 (90 Base) MCG/ACT inhaler; Inhale 1-2 puffs into the lungs every 6 (six) hours as needed (cough).  Dispense: 6.7 g;  Refill: 0 - benzonatate (TESSALON) 100 MG capsule; Take 1 capsule (100 mg total) by mouth every 8 (eight) hours.  Dispense: 21 capsule;  Refill: 0  Instructions about new medications and side effects provided.  Plan:   Discharge Instructions      Take doxycycline and tessalon perles as prescribed. Use your albuterol inhaler as directed.  Rest, push lots of fluids (especially water), and utilize supportive care for symptoms. You may take take acetaminophen (Tylenol) every 4-6 hours or ibuprofen every 6-8 hours for muscle pain, joint pain, headaches. Mucinex (guaifenesin) may be taken over the counter for cough as needed and can loosen phlegm. Please read the instructions and take as directed. Saline nasal sprays to rinse congestion can help as well. Warm tea with lemon and honey can sooth sore throat and cough, as can cough drops.  Take Tessalon Perles with Mucinex for cough.  Return to clinic for new-onset fever, difficulty breathing, chest pain, symptoms lasting >3 to 4 weeks, or bloody sputum.         Serafina Royals, [place] 08/29/21 1535

## 2021-08-29 NOTE — ED Triage Notes (Addendum)
Patient started having symptoms on Saturday.  Patient has had general aches, cough, intermittent low grade fever ( 99.5).  complains of head and chest congestion, diarrhea, itchy ears  Stopped gabapentin and diclofenac abruptly 3 weeks ago

## 2021-08-29 NOTE — Discharge Instructions (Signed)
Take doxycycline and tessalon perles as prescribed. Use your albuterol inhaler as directed.  Rest, push lots of fluids (especially water), and utilize supportive care for symptoms. You may take take acetaminophen (Tylenol) every 4-6 hours or ibuprofen every 6-8 hours for muscle pain, joint pain, headaches. Mucinex (guaifenesin) may be taken over the counter for cough as needed and can loosen phlegm. Please read the instructions and take as directed. Saline nasal sprays to rinse congestion can help as well. Warm tea with lemon and honey can sooth sore throat and cough, as can cough drops.  Take Tessalon Perles with Mucinex for cough.  Return to clinic for new-onset fever, difficulty breathing, chest pain, symptoms lasting >3 to 4 weeks, or bloody sputum.

## 2021-09-18 ENCOUNTER — Other Ambulatory Visit: Payer: Self-pay | Admitting: Internal Medicine

## 2021-09-21 DIAGNOSIS — Z23 Encounter for immunization: Secondary | ICD-10-CM | POA: Diagnosis not present

## 2021-10-04 ENCOUNTER — Ambulatory Visit (INDEPENDENT_AMBULATORY_CARE_PROVIDER_SITE_OTHER): Payer: Medicare Other

## 2021-10-04 ENCOUNTER — Other Ambulatory Visit: Payer: Self-pay

## 2021-10-04 ENCOUNTER — Encounter: Payer: Self-pay | Admitting: Internal Medicine

## 2021-10-04 ENCOUNTER — Ambulatory Visit (INDEPENDENT_AMBULATORY_CARE_PROVIDER_SITE_OTHER): Payer: Medicare Other | Admitting: Internal Medicine

## 2021-10-04 VITALS — BP 142/80 | HR 66 | Temp 95.9°F | Ht 61.0 in | Wt 152.8 lb

## 2021-10-04 DIAGNOSIS — R058 Other specified cough: Secondary | ICD-10-CM | POA: Diagnosis not present

## 2021-10-04 DIAGNOSIS — I10 Essential (primary) hypertension: Secondary | ICD-10-CM | POA: Diagnosis not present

## 2021-10-04 DIAGNOSIS — K760 Fatty (change of) liver, not elsewhere classified: Secondary | ICD-10-CM

## 2021-10-04 DIAGNOSIS — R509 Fever, unspecified: Secondary | ICD-10-CM | POA: Diagnosis not present

## 2021-10-04 DIAGNOSIS — E669 Obesity, unspecified: Secondary | ICD-10-CM | POA: Diagnosis not present

## 2021-10-04 DIAGNOSIS — E1169 Type 2 diabetes mellitus with other specified complication: Secondary | ICD-10-CM

## 2021-10-04 DIAGNOSIS — E785 Hyperlipidemia, unspecified: Secondary | ICD-10-CM | POA: Diagnosis not present

## 2021-10-04 DIAGNOSIS — R059 Cough, unspecified: Secondary | ICD-10-CM | POA: Diagnosis not present

## 2021-10-04 DIAGNOSIS — D126 Benign neoplasm of colon, unspecified: Secondary | ICD-10-CM

## 2021-10-04 LAB — COMPREHENSIVE METABOLIC PANEL
ALT: 42 U/L — ABNORMAL HIGH (ref 0–35)
AST: 35 U/L (ref 0–37)
Albumin: 4.4 g/dL (ref 3.5–5.2)
Alkaline Phosphatase: 70 U/L (ref 39–117)
BUN: 21 mg/dL (ref 6–23)
CO2: 30 mEq/L (ref 19–32)
Calcium: 9.1 mg/dL (ref 8.4–10.5)
Chloride: 102 mEq/L (ref 96–112)
Creatinine, Ser: 1.05 mg/dL (ref 0.40–1.20)
GFR: 54.71 mL/min — ABNORMAL LOW (ref >60.00)
Glucose, Bld: 84 mg/dL (ref 70–99)
Potassium: 3.9 mEq/L (ref 3.5–5.1)
Sodium: 140 mEq/L (ref 135–145)
Total Bilirubin: 0.6 mg/dL (ref 0.2–1.2)
Total Protein: 6.8 g/dL (ref 6.0–8.3)

## 2021-10-04 LAB — LIPID PANEL
Cholesterol: 148 mg/dL (ref 0–200)
HDL: 42.6 mg/dL (ref >39.00)
NonHDL: 105.27
Total CHOL/HDL Ratio: 3
Triglycerides: 201 mg/dL — ABNORMAL HIGH (ref 0.0–149.0)
VLDL: 40.2 mg/dL — ABNORMAL HIGH (ref 0.0–40.0)

## 2021-10-04 LAB — LDL CHOLESTEROL, DIRECT: Direct LDL: 79 mg/dL

## 2021-10-04 LAB — HEMOGLOBIN A1C: Hgb A1c MFr Bld: 6.9 % — ABNORMAL HIGH (ref 4.6–6.5)

## 2021-10-04 NOTE — Progress Notes (Signed)
Subjective:  Patient ID: Sara Mills, female    DOB: Oct 16, 1951  Age: 70 y.o. MRN: [id number]  CC: The primary encounter diagnosis was Essential hypertension. Diagnoses of Diabetes mellitus type 2 in obese ([place]), Hyperlipidemia due to type 2 diabetes mellitus ([place]), Tubular adenoma of colon, Cough productive of purulent sputum, and Hepatic steatosis were also pertinent to this visit.  HPI Sara Mills presents for  follow up on type 2 DM Chief Complaint  Patient presents with   Follow-up    6 month follow up on diabetes, hypertension, hyperlipidemia   Turned away from clinic on Sept 20 for URI symptoms .  Went to urgent  care.  Not tested for flu/COPD/RSV bc she has tested   had  tested twice for covid and negative.   Treated with doxycycline .  Stayed in bed for a week.  No appetite, coughed constantly , had bloody sinus drainage ,  pleuriSy   dod not take probiotic (was not told to)  Type 2 DM:   She  feels generally well,  But is not  exercising regularly or trying to lose weight. Checking  blood sugars less than once daily at variable times, usually only if she feels she may be having a hypoglycemic event. .  BS have been under 130 fasting and < 150 post prandially.    Taking   medications as directed. Following a carbohydrate modified diet 6 days per week. Denies numbness, burning and tingling of extremities. Appetite is good.  Recurrent hypoglycemic episdoes in the afternoon   Hypertension: patient checks blood pressure twice weekly at home.  Readings have been for the most part < 140/80 at rest . Patient is following a reduce salt diet most days and is taking medications as prescribed    Outpatient Medications Prior to Visit  Medication Sig Dispense Refill   Accu-Chek FastClix Lancets MISC USE 1 LANCET TO CHECK GLUCOSE TWICE DAILY 102 each 1   diclofenac (VOLTAREN) 75 MG EC tablet TAKE 1 TABLET BY MOUTH TWICE A DAY 60 tablet 2   gabapentin (NEURONTIN) 300 MG capsule Take 300 mg  by mouth 2 (two) times daily.     glipiZIDE (GLUCOTROL) 5 MG tablet Take 1 tablet (5 mg total) by mouth 2 (two) times daily before a meal. 180 tablet 1   glucose blood (ACCU-CHEK GUIDE) test strip USE 1 STRIP TO CHECK BLOOD GLUCOSE TWICE DAILY DX:E11.69 100 each 1   losartan-hydrochlorothiazide (HYZAAR) 50-12.5 MG tablet Take 1 tablet by mouth daily. 30 tablet 5   metFORMIN (GLUCOPHAGE) 1000 MG tablet Take 1 tablet (1,000 mg total) by mouth 2 (two) times daily with a meal. 60 tablet 5   montelukast (SINGULAIR) 10 MG tablet TAKE 1 TABLET BY MOUTH EVERYDAY AT BEDTIME 90 tablet 1   omeprazole (PRILOSEC) 40 MG capsule Take 1 capsule (40 mg total) by mouth daily. 30 capsule 5   rosuvastatin (CRESTOR) 20 MG tablet TAKE 1 TABLET BY MOUTH EVERY DAY 90 tablet 1   traMADol (ULTRAM) 50 MG tablet TAKE 1 TABLET BY MOUTH EVERY 6 HOURS AS NEEDED 30 tablet 0   valACYclovir (VALTREX) 500 MG tablet Take 1 tablet (500 mg total) by mouth 3 (three) times daily. 21 tablet 2   zolpidem (AMBIEN) 10 MG tablet TAKE 1 TABLET (10 MG TOTAL) BY MOUTH AT BEDTIME AS NEEDED. FOR SLEEP 30 tablet 5   albuterol (VENTOLIN HFA) 108 (90 Base) MCG/ACT inhaler Inhale 1-2 puffs into the lungs every 6 (six) hours  as needed (cough). (Patient not taking: Reported on 10/04/2021) 6.7 g 0   benzonatate (TESSALON) 100 MG capsule Take 1 capsule (100 mg total) by mouth every 8 (eight) hours. (Patient not taking: Reported on 10/04/2021) 21 capsule 0   doxycycline (VIBRAMYCIN) 100 MG capsule Take 1 capsule (100 mg total) by mouth 2 (two) times daily. (Patient not taking: Reported on 10/04/2021) 20 capsule 0   No facility-administered medications prior to visit.    Review of Systems;  Patient denies headache, fevers, malaise, unintentional weight loss, skin rash, eye pain, sinus congestion and sinus pain, sore throat, dysphagia,  hemoptysis , cough, dyspnea, wheezing, chest pain, palpitations, orthopnea, edema, abdominal pain, nausea, melena,  diarrhea, constipation, flank pain, dysuria, hematuria, urinary  Frequency, nocturia, numbness, tingling, seizures,  Focal weakness, Loss of consciousness,  Tremor, insomnia, depression, anxiety, and suicidal ideation.      Objective:  BP (!) 142/80 (BP Location: Left Arm, Patient Position: Sitting, Cuff Size: Normal)   Pulse 66   Temp (!) 95.9 F (35.5 C) (Temporal)   Ht 5\' 1"  (1.549 m)   Wt 152 lb 12.8 oz (69.3 kg)   SpO2 98%   BMI 28.87 kg/m   BP Readings from Last 3 Encounters:  10/04/21 (!) 142/80  08/29/21 119/76  04/04/21 (!) 150/84    Wt Readings from Last 3 Encounters:  10/04/21 152 lb 12.8 oz (69.3 kg)  04/04/21 154 lb 6.4 oz (70 kg)  11/24/20 148 lb (67.1 kg)    General appearance: alert, cooperative and appears stated age Ears: normal TM's and external ear canals both ears Throat: lips, mucosa, and tongue normal; teeth and gums normal Neck: no adenopathy, no carotid bruit, supple, symmetrical, trachea midline and thyroid not enlarged, symmetric, no tenderness/mass/nodules Back: symmetric, no curvature. ROM normal. No CVA tenderness. Lungs: clear to auscultation bilaterally Heart: regular rate and rhythm, S1, S2 normal, no murmur, click, rub or gallop Abdomen: soft, non-tender; bowel sounds normal; no masses,  no organomegaly Pulses: 2+ and symmetric Skin: Skin color, texture, turgor normal. No rashes or lesions Lymph nodes: Cervical, supraclavicular, and axillary nodes normal.  Lab Results  Component Value Date   HGBA1C 6.9 (H) 10/04/2021   HGBA1C 7.4 (H) 04/04/2021   HGBA1C 7.3 (H) 10/04/2020    Lab Results  Component Value Date   CREATININE 1.05 10/04/2021   CREATININE 1.46 (H) 04/04/2021   CREATININE 1.02 10/04/2020    Lab Results  Component Value Date   WBC 5.2 11/26/2019   HGB 12.2 11/26/2019   HCT 37.0 11/26/2019   PLT 123.0 (L) 11/26/2019   GLUCOSE 84 10/04/2021   CHOL 148 10/04/2021   TRIG 201.0 (H) 10/04/2021   HDL 42.60 10/04/2021    LDLDIRECT 79.0 10/04/2021   LDLCALC 72 05/23/2020   ALT 42 (H) 10/04/2021   AST 35 10/04/2021   NA 140 10/04/2021   K 3.9 10/04/2021   CL 102 10/04/2021   CREATININE 1.05 10/04/2021   BUN 21 10/04/2021   CO2 30 10/04/2021   TSH 1.47 04/04/2021   HGBA1C 6.9 (H) 10/04/2021   MICROALBUR 2.3 (H) 04/04/2021    No results found.  Assessment & Plan:   Problem List Items Addressed This Visit     Diabetes mellitus type 2 in obese ([place])    Improved control since increasing glipizide to 5 mg twice daily but recurrent episodes of hypoglycemia occurring in the afternoon.  Advised to reduce morning dose of glipizide to 2.5 mg  Renal function stable, no changes  today.  .  Continue glipizide, metformin,  Statin and ARB   Lab Results  Component Value Date   HGBA1C 6.9 (H) 10/04/2021   Lab Results  Component Value Date   MICROALBUR 2.3 (H) 04/04/2021   MICROALBUR 25.5 (H) 05/23/2020   Lab Results  Component Value Date   CHOL 148 10/04/2021   HDL 42.60 10/04/2021   LDLCALC 72 05/23/2020   LDLDIRECT 79.0 10/04/2021   TRIG 201.0 (H) 10/04/2021   CHOLHDL 3 10/04/2021          Relevant Orders   Comprehensive metabolic panel (Completed)   Hemoglobin A1c (Completed)   Essential hypertension - Primary    Based on home readings.[name]  His blood pressure is well controlled on current regimen       Relevant Orders   Comprehensive metabolic panel (Completed)   Hepatic steatosis    Taking metformin and statin.  Wt loss advised.  ALT still slightly elevated   Lab Results  Component Value Date   ALT 42 (H) 10/04/2021   AST 35 10/04/2021   ALKPHOS 70 10/04/2021   BILITOT 0.6 10/04/2021         Hyperlipidemia due to type 2 diabetes mellitus ([place])    LDL has been  at goal on rosuvastatin 20 mg daily .    Lab Results  Component Value Date   CHOL 148 10/04/2021   HDL 42.60 10/04/2021   LDLCALC 72 05/23/2020   LDLDIRECT 79.0 10/04/2021   TRIG 201.0 (H) 10/04/2021   CHOLHDL 3  10/04/2021   Lab Results  Component Value Date   ALT 42 (H) 10/04/2021   AST 35 10/04/2021   ALKPHOS 70 10/04/2021   BILITOT 0.6 10/04/2021         Relevant Orders   Lipid panel (Completed)   Other Visit Diagnoses     Tubular adenoma of colon       Relevant Orders   Ambulatory referral to Gastroenterology   Cough productive of purulent sputum       Relevant Orders   DG Chest 2 View       I have discontinued Dazani L. Albro's doxycycline, albuterol, and benzonatate. I am also having her maintain her valACYclovir, traMADol, losartan-hydrochlorothiazide, metFORMIN, omeprazole, gabapentin, glipiZIDE, Accu-Chek FastClix Lancets, zolpidem, Accu-Chek Guide, diclofenac, rosuvastatin, and montelukast.  No orders of the defined types were placed in this encounter.   Medications Discontinued During This Encounter  Medication Reason   albuterol (VENTOLIN HFA) 108 (90 Base) MCG/ACT inhaler    benzonatate (TESSALON) 100 MG capsule    doxycycline (VIBRAMYCIN) 100 MG capsule     Follow-up: Return in about 6 months (around 04/04/2022).   Crecencio Mc, MD

## 2021-10-04 NOTE — Patient Instructions (Addendum)
Your low blood sugar is due to your morning dose of glipizide being too high for you  Reduce morning glipizide dose to 1/2 tablet (2.5 mg) and continue whole tablet ( 5mg  ) with dinner   Try the low GI KIND BARS AT     Please take a probiotic ( Align, Floraque or Culturelle), the generic version of one of these over the counter medications,Yogurt, for a minimum of 3 weeks  ANY TIME YOU ARE TREATED WITH AN ANTIBIOTIIC to prevent a serious antibiotic associated diarrhea  Called clostridium dificile colitis.  Taking a probiotic may also prevent vaginitis due to yeast infections and can be continued indefinitely if you feel that it improves your digestion or your elimination (bowels).

## 2021-10-05 DIAGNOSIS — L570 Actinic keratosis: Secondary | ICD-10-CM | POA: Diagnosis not present

## 2021-10-05 DIAGNOSIS — Z85828 Personal history of other malignant neoplasm of skin: Secondary | ICD-10-CM | POA: Diagnosis not present

## 2021-10-05 DIAGNOSIS — L821 Other seborrheic keratosis: Secondary | ICD-10-CM | POA: Diagnosis not present

## 2021-10-05 DIAGNOSIS — R208 Other disturbances of skin sensation: Secondary | ICD-10-CM | POA: Diagnosis not present

## 2021-10-05 DIAGNOSIS — L720 Epidermal cyst: Secondary | ICD-10-CM | POA: Diagnosis not present

## 2021-10-07 NOTE — Assessment & Plan Note (Addendum)
Based on home readings.[name]  His blood pressure is well controlled on current regimen

## 2021-10-07 NOTE — Assessment & Plan Note (Addendum)
Improved control since increasing glipizide to 5 mg twice daily but recurrent episodes of hypoglycemia occurring in the afternoon.  Advised to reduce morning dose of glipizide to 2.5 mg  Renal function stable, no changes today.  .  Continue glipizide, metformin,  Statin and ARB   Lab Results  Component Value Date   HGBA1C 6.9 (H) 10/04/2021   Lab Results  Component Value Date   MICROALBUR 2.3 (H) 04/04/2021   MICROALBUR 25.5 (H) 05/23/2020   Lab Results  Component Value Date   CHOL 148 10/04/2021   HDL 42.60 10/04/2021   LDLCALC 72 05/23/2020   LDLDIRECT 79.0 10/04/2021   TRIG 201.0 (H) 10/04/2021   CHOLHDL 3 10/04/2021

## 2021-10-07 NOTE — Assessment & Plan Note (Signed)
Taking metformin and statin.  Wt loss advised.  ALT still slightly elevated   Lab Results  Component Value Date   ALT 42 (H) 10/04/2021   AST 35 10/04/2021   ALKPHOS 70 10/04/2021   BILITOT 0.6 10/04/2021

## 2021-10-07 NOTE — Assessment & Plan Note (Signed)
LDL has been  at goal on rosuvastatin 20 mg daily .    Lab Results  Component Value Date   CHOL 148 10/04/2021   HDL 42.60 10/04/2021   LDLCALC 72 05/23/2020   LDLDIRECT 79.0 10/04/2021   TRIG 201.0 (H) 10/04/2021   CHOLHDL 3 10/04/2021   Lab Results  Component Value Date   ALT 42 (H) 10/04/2021   AST 35 10/04/2021   ALKPHOS 70 10/04/2021   BILITOT 0.6 10/04/2021

## 2021-10-15 ENCOUNTER — Other Ambulatory Visit: Payer: Self-pay | Admitting: Internal Medicine

## 2021-10-19 DIAGNOSIS — Z20828 Contact with and (suspected) exposure to other viral communicable diseases: Secondary | ICD-10-CM | POA: Diagnosis not present

## 2021-10-28 ENCOUNTER — Other Ambulatory Visit: Payer: Self-pay | Admitting: Internal Medicine

## 2021-11-05 ENCOUNTER — Telehealth: Payer: Self-pay | Admitting: Internal Medicine

## 2021-11-05 NOTE — Telephone Encounter (Signed)
Rejection Reason - Other - 10/04/20 LVM 10/18/20 LVM Colon due 10/2022" Sara Mills said on Oct 26, 2021 12:34 PM  Msg from [facility]

## 2021-11-23 ENCOUNTER — Other Ambulatory Visit: Payer: Self-pay | Admitting: Internal Medicine

## 2021-11-27 ENCOUNTER — Ambulatory Visit (INDEPENDENT_AMBULATORY_CARE_PROVIDER_SITE_OTHER): Payer: Medicare Other

## 2021-11-27 VITALS — Ht 61.0 in | Wt 152.0 lb

## 2021-11-27 DIAGNOSIS — Z1231 Encounter for screening mammogram for malignant neoplasm of breast: Secondary | ICD-10-CM | POA: Diagnosis not present

## 2021-11-27 DIAGNOSIS — Z0000 Encounter for general adult medical examination without abnormal findings: Secondary | ICD-10-CM | POA: Diagnosis not present

## 2021-11-27 NOTE — Patient Instructions (Addendum)
Sara Mills , Thank you for taking time to come for your Medicare Wellness Visit. I appreciate your ongoing commitment to your health goals. Please review the following plan we discussed and let me know if I can assist you in the future.   These are the goals we discussed:  Goals       Patient Stated     Increase physical activity (pt-stated)      I would like to lose a little weight      Other     Low carb diet        This is a list of the screening recommended for you and due dates:  Health Maintenance  Topic Date Due   Colon Cancer Screening  11/07/2020   COVID-19 Vaccine (4 - Booster for Pfizer series) 12/13/2021*   Pneumonia Vaccine (3 - PPSV23 if available, else PCV20) 11/27/2022*   Mammogram  02/02/2022   Eye exam for diabetics  02/26/2022   Complete foot exam   04/04/2022   Hemoglobin A1C  04/04/2022   Tetanus Vaccine  01/11/2024   Flu Shot  Completed   DEXA scan (bone density measurement)  Completed   Hepatitis C Screening: USPSTF Recommendation to screen - Ages 47-79 yo.  Completed   Zoster (Shingles) Vaccine  Completed   HPV Vaccine  Aged Out  *Topic was postponed. The date shown is not the original due date.    Advanced directives: not at this time  Conditions/risks identified: none new  Mammogram -ordered. Number provided for scheduling [phone]. [facility]. Schedule on or after 02/02/22.   Follow up in one year for your annual wellness visit    Preventive Care 65 Years and Older, Female Preventive care refers to lifestyle choices and visits with your health care provider that can promote health and wellness. What does preventive care include? A yearly physical exam. This is also called an annual well check. Dental exams once or twice a year. Routine eye exams. Ask your health care provider how often you should have your eyes checked. Personal lifestyle choices, including: Daily care of your teeth and gums. Regular physical  activity. Eating a healthy diet. Avoiding tobacco and drug use. Limiting alcohol use. Practicing safe sex. Taking low-dose aspirin every day. Taking vitamin and mineral supplements as recommended by your health care provider. What happens during an annual well check? The services and screenings done by your health care provider during your annual well check will depend on your age, overall health, lifestyle risk factors, and family history of disease. Counseling  Your health care provider may ask you questions about your: Alcohol use. Tobacco use. Drug use. Emotional well-being. Home and relationship well-being. Sexual activity. Eating habits. History of falls. Memory and ability to understand (cognition). Work and work Statistician. Reproductive health. Screening  You may have the following tests or measurements: Height, weight, and BMI. Blood pressure. Lipid and cholesterol levels. These may be checked every 5 years, or more frequently if you are over 47 years old. Skin check. Lung cancer screening. You may have this screening every year starting at age 85 if you have a 30-pack-year history of smoking and currently smoke or have quit within the past 15 years. Fecal occult blood test (FOBT) of the stool. You may have this test every year starting at age 48. Flexible sigmoidoscopy or colonoscopy. You may have a sigmoidoscopy every 5 years or a colonoscopy every 10 years starting at age 22. Hepatitis C blood test. Hepatitis B  blood test. Sexually transmitted disease (STD) testing. Diabetes screening. This is done by checking your blood sugar (glucose) after you have not eaten for a while (fasting). You may have this done every 1-3 years. Bone density scan. This is done to screen for osteoporosis. You may have this done starting at age 61. Mammogram. This may be done every 1-2 years. Talk to your health care provider about how often you should have regular mammograms. Talk with your  health care provider about your test results, treatment options, and if necessary, the need for more tests. Vaccines  Your health care provider may recommend certain vaccines, such as: Influenza vaccine. This is recommended every year. Tetanus, diphtheria, and acellular pertussis (Tdap, Td) vaccine. You may need a Td booster every 10 years. Zoster vaccine. You may need this after age 67. Pneumococcal 13-valent conjugate (PCV13) vaccine. One dose is recommended after age 7. Pneumococcal polysaccharide (PPSV23) vaccine. One dose is recommended after age 55. Talk to your health care provider about which screenings and vaccines you need and how often you need them. This information is not intended to replace advice given to you by your health care provider. Make sure you discuss any questions you have with your health care provider. Document Released: 12/22/2015 Document Revised: 08/14/2016 Document Reviewed: 09/26/2015 Elsevier Interactive Patient Education  2017 [place] Prevention in the Home Falls can cause injuries. They can happen to people of all ages. There are many things you can do to make your home safe and to help prevent falls. What can I do on the outside of my home? Regularly fix the edges of walkways and driveways and fix any cracks. Remove anything that might make you trip as you walk through a door, such as a raised step or threshold. Trim any bushes or trees on the path to your home. Use bright outdoor lighting. Clear any walking paths of anything that might make someone trip, such as rocks or tools. Regularly check to see if handrails are loose or broken. Make sure that both sides of any steps have handrails. Any raised decks and porches should have guardrails on the edges. Have any leaves, snow, or ice cleared regularly. Use sand or salt on walking paths during winter. Clean up any spills in your garage right away. This includes oil or grease spills. What can I  do in the bathroom? Use night lights. Install grab bars by the toilet and in the tub and shower. Do not use towel bars as grab bars. Use non-skid mats or decals in the tub or shower. If you need to sit down in the shower, use a plastic, non-slip stool. Keep the floor dry. Clean up any water that spills on the floor as soon as it happens. Remove soap buildup in the tub or shower regularly. Attach bath mats securely with double-sided non-slip rug tape. Do not have throw rugs and other things on the floor that can make you trip. What can I do in the bedroom? Use night lights. Make sure that you have a light by your bed that is easy to reach. Do not use any sheets or blankets that are too big for your bed. They should not hang down onto the floor. Have a firm chair that has side arms. You can use this for support while you get dressed. Do not have throw rugs and other things on the floor that can make you trip. What can I do in the kitchen? Clean up any spills  right away. Avoid walking on wet floors. Keep items that you use a lot in easy-to-reach places. If you need to reach something above you, use a strong step stool that has a grab bar. Keep electrical cords out of the way. Do not use floor polish or wax that makes floors slippery. If you must use wax, use non-skid floor wax. Do not have throw rugs and other things on the floor that can make you trip. What can I do with my stairs? Do not leave any items on the stairs. Make sure that there are handrails on both sides of the stairs and use them. Fix handrails that are broken or loose. Make sure that handrails are as long as the stairways. Check any carpeting to make sure that it is firmly attached to the stairs. Fix any carpet that is loose or worn. Avoid having throw rugs at the top or bottom of the stairs. If you do have throw rugs, attach them to the floor with carpet tape. Make sure that you have a light switch at the top of the stairs  and the bottom of the stairs. If you do not have them, ask someone to add them for you. What else can I do to help prevent falls? Wear shoes that: Do not have high heels. Have rubber bottoms. Are comfortable and fit you well. Are closed at the toe. Do not wear sandals. If you use a stepladder: Make sure that it is fully opened. Do not climb a closed stepladder. Make sure that both sides of the stepladder are locked into place. Ask someone to hold it for you, if possible. Clearly mark and make sure that you can see: Any grab bars or handrails. First and last steps. Where the edge of each step is. Use tools that help you move around (mobility aids) if they are needed. These include: Canes. Walkers. Scooters. Crutches. Turn on the lights when you go into a dark area. Replace any light bulbs as soon as they burn out. Set up your furniture so you have a clear path. Avoid moving your furniture around. If any of your floors are uneven, fix them. If there are any pets around you, be aware of where they are. Review your medicines with your doctor. Some medicines can make you feel dizzy. This can increase your chance of falling. Ask your doctor what other things that you can do to help prevent falls. This information is not intended to replace advice given to you by your health care provider. Make sure you discuss any questions you have with your health care provider. Document Released: 09/21/2009 Document Revised: 05/02/2016 Document Reviewed: 12/30/2014 Elsevier Interactive Patient Education  2017 [place].  Opioid Pain Medicine Management Opioids are powerful medicines that are used to treat moderate to severe pain. When used for short periods of time, they can help you to: Sleep better. Do better in physical or occupational therapy. Feel better in the first few days after an injury. Recover from surgery. Opioids should be taken with the supervision of a trained health care provider.  They should be taken for the shortest period of time possible. This is because opioids can be addictive, and the longer you take opioids, the greater your risk of addiction. This addiction can also be called opioid use disorder. What are the risks? Using opioid pain medicines for longer than 3 days increases your risk of side effects. Side effects include: Constipation. Nausea and vomiting. Breathing difficulties (respiratory depression). Drowsiness. Confusion.  Opioid use disorder. Itching. Taking opioid pain medicine for a long period of time can affect your ability to do daily tasks. It also puts you at risk for: Motor vehicle crashes. Depression. Suicide. Heart attack. Overdose, which can be life-threatening. What is a pain treatment plan? A pain treatment plan is an agreement between you and your health care provider. Pain is unique to each person, and treatments vary depending on your condition. To manage your pain, you and your health care provider need to work together. To help you do this: Discuss the goals of your treatment, including how much pain you might expect to have and how you will manage the pain. Review the risks and benefits of taking opioid medicines. Remember that a good treatment plan uses more than one approach and minimizes the chance of side effects. Be honest about the amount of medicines you take and about any drug or alcohol use. Get pain medicine prescriptions from only one health care provider. Pain can be managed with many types of alternative treatments. Ask your health care provider to refer you to one or more specialists who can help you manage pain through: Physical or occupational therapy. Counseling (cognitive behavioral therapy). Good nutrition. Biofeedback. Massage. Meditation. Non-opioid medicine. Following a gentle exercise program. How to use opioid pain medicine Taking medicine Take your pain medicine exactly as told by your health care  provider. Take it only when you need it. If your pain gets less severe, you may take less than your prescribed dose if your health care provider approves. If you are not having pain, do nottake pain medicine unless your health care provider tells you to take it. If your pain is severe, do nottry to treat it yourself by taking more pills than instructed on your prescription. Contact your health care provider for help. Write down the times when you take your pain medicine. It is easy to become confused while on pain medicine. Writing the time can help you avoid overdose. Take other over-the-counter or prescription medicines only as told by your health care provider. Keeping yourself and others safe  While you are taking opioid pain medicine: Do not drive, use machinery, or power tools. Do not sign legal documents. Do not drink alcohol. Do not take sleeping pills. Do not supervise children by yourself. Do not do activities that require climbing or being in high places. Do not go to a lake, river, ocean, spa, or swimming pool. Do not share your pain medicine with anyone. Keep pain medicine in a locked cabinet or in a secure area where pets and children cannot reach it. Stopping your use of opioids If you have been taking opioid medicine for more than a few weeks, you may need to slowly decrease (taper) how much you take until you stop completely. Tapering your use of opioids can decrease your risk of symptoms of withdrawal, such as: Pain and cramping in the abdomen. Nausea. Sweating. Sleepiness. Restlessness. Uncontrollable shaking (tremors). Cravings for the medicine. Do not attempt to taper your use of opioids on your own. Talk with your health care provider about how to do this. Your health care provider may prescribe a step-down schedule based on how much medicine you are taking and how long you have been taking it. Getting rid of leftover pills Do not save any leftover pills. Get rid of  leftover pills safely by: Taking the medicine to a prescription take-back program. This is usually offered by the county or law enforcement. Bringing them to  a pharmacy that has a drug disposal container. Flushing them down the toilet. Check the label or package insert of your medicine to see whether this is safe to do. Throwing them out in the trash. Check the label or package insert of your medicine to see whether this is safe to do. If it is safe to throw it out, remove the medicine from the original container, put it into a sealable bag or container, and mix it with used coffee grounds, food scraps, dirt, or cat litter before putting it in the trash. Follow these instructions at home: Activity Do exercises as told by your health care provider. Avoid activities that make your pain worse. Return to your normal activities as told by your health care provider. Ask your health care provider what activities are safe for you. General instructions You may need to take these actions to prevent or treat constipation: Drink enough fluid to keep your urine pale yellow. Take over-the-counter or prescription medicines. Eat foods that are high in fiber, such as beans, whole grains, and fresh fruits and vegetables. Limit foods that are high in fat and processed sugars, such as fried or sweet foods. Keep all follow-up visits. This is important. Where to find support If you have been taking opioids for a long time, you may benefit from receiving support for quitting from a local support group or counselor. Ask your health care provider for a referral to these resources in your area. Where to find more information Centers for Disease Control and Prevention (CDC): http://www.wolf.info/ [place] Food and Drug Administration (FDA): [place] Get help right away if: You may have taken too much of an opioid (overdosed). Common symptoms of an overdose: Your breathing is slower or more shallow than normal. You have a very  slow heartbeat (pulse). You have slurred speech. You have nausea and vomiting. Your pupils become very small. You have other potential symptoms: You are very confused. You faint or feel like you will faint. You have cold, clammy skin. You have blue lips or fingernails. You have thoughts of harming yourself or harming others. These symptoms may represent a serious problem that is an emergency. Do not wait to see if the symptoms will go away. Get medical help right away. Call your local emergency services (911 in the [place] Do not drive yourself to the hospital.  If you ever feel like you may hurt yourself or others, or have thoughts about taking your own life, get help right away. Go to your nearest emergency department or: Call your local emergency services (911 in the [place] Call the [facility] 828-638-0605 in the [place] Call a suicide crisis helpline, such as the [place] at 586-379-4939 or 988 in the [place]. This is open 24 hours a day in the [place] Text the Crisis Text Line at 616-703-8245 (in the [place].). Summary Opioid medicines can help you manage moderate to severe pain for a short period of time. A pain treatment plan is an agreement between you and your health care provider. Discuss the goals of your treatment, including how much pain you might expect to have and how you will manage the pain. If you think that you or someone else may have taken too much of an opioid, get medical help right away. This information is not intended to replace advice given to you by your health care provider. Make sure you discuss any questions you have with your health care provider. Document Revised: 06/20/2021 Document Reviewed: 03/07/2021  Elsevier Patient Education  2022 [place] A mammogram is an X-ray of the breasts. This procedure can screen for and detect any changes that may indicate breast cancer. Mammograms are regularly done beginning  at age 20 for women with average risk. A man may have a mammogram if he has a lump or swelling in his breast tissue. A mammogram can also identify other changes and variations in the breast, such as: Inflammation of the breast tissue (mastitis). An infected area that contains a collection of pus (abscess). A fluid-filled sac (cyst). Tumors that are not cancerous (benign). Fibrocystic changes. This is when breast tissue becomes denser and can make the tissue feel rope-like or uneven under the skin. Women at higher risk for breast cancer need earlier and more comprehensive screening for abnormal changes. Breast tomosynthesis, or three-dimensional (3D) mammography, and digital breast tomosynthesis are advanced forms of imaging that create 3D pictures of the breasts. Tell a health care provider: About any allergies you have. If you have breast implants. If you have had previous breast disease, biopsy, or surgery. If you have a family history of breast cancer. If you are breastfeeding. Whether you are pregnant or may be pregnant. What are the risks? Generally, this is a safe procedure. However, problems may occur, including: Exposure to radiation. Radiation levels are very low with this test. The need for more tests. The mammogram fails to detect certain cancers or the results are misinterpreted. Difficulty with detecting breast cancer in women with dense breasts. What happens before the procedure? Schedule your test about 1-2 weeks after your menstrual period if you are menstruating. This is usually when your breasts are the least tender. If you have had a mammogram done at a different facility in the past, get the mammogram X-rays or have them sent to your current exam facility. The new and old images will be compared. Wash your breasts and underarms on the day of the test. Do not wear deodorants, perfumes, lotions, or powders anywhere on your body on the day of the test. Remove any jewelry  from your neck. Wear clothes that you can change into and out of easily. What happens during the procedure?  You will undress from the waist up and put on a gown that opens in the front. You will stand in front of the X-ray machine. Each breast will be placed between two plastic or glass plates. The plates will compress your breast for a few seconds. Try to stay as relaxed as possible. This procedure does not cause any harm to your breasts. Any discomfort you feel will be very brief. X-rays will be taken from different angles of each breast. The procedure may vary among health care providers and hospitals. What can I expect after the procedure? The mammogram will be examined by a specialist (radiologist). You may need to repeat certain parts of the test, depending on the quality of the images. This is done if the radiologist needs a better view of the breast tissue. You may resume your normal activities. It is up to you to get the results of your procedure. Ask your health care provider, or the department that is doing the procedure, when your results will be ready. Summary A mammogram is an X-ray of the breasts. This procedure can screen for and detect any changes that may indicate breast cancer. A man may have a mammogram if he has a lump or swelling in his breast tissue. If you have had a mammogram done  at a different facility in the past, get the mammogram X-rays or have them sent to your current exam facility in order to compare them. Schedule your test about 1-2 weeks after your menstrual period if you are menstruating. Ask when your test results will be ready. Make sure you get your test results. This information is not intended to replace advice given to you by your health care provider. Make sure you discuss any questions you have with your health care provider. Document Revised: 08/08/2021 Document Reviewed: 09/25/2020 Elsevier Patient Education  2022 [employer].

## 2021-11-27 NOTE — Progress Notes (Addendum)
Subjective:   Sara Mills is a 70 y.o. female who presents for Medicare Annual (Subsequent) preventive examination.  Review of Systems    No ROS.  Medicare Wellness Virtual Visit.  Visual/audio telehealth visit, UTA vital signs.   See social history for additional risk factors.   Cardiac Risk Factors include: advanced age (>82men, >68 women);diabetes mellitus;hypertension     Objective:    Today's Vitals   11/27/21 1120  Weight: 152 lb (68.9 kg)  Height: 5\' 1"  (1.549 m)   Body mass index is 28.72 kg/m.  Advanced Directives 11/27/2021 11/24/2020 11/24/2019  Does Patient Have a Medical Advance Directive? No No No  Would patient like information on creating a medical advance directive? No - Patient declined - Yes (MAU/Ambulatory/Procedural Areas - Information given)    Current Medications (verified) Outpatient Encounter Medications as of 11/27/2021  Medication Sig   Accu-Chek FastClix Lancets MISC USE 1 LANCET TO CHECK GLUCOSE TWICE DAILY   diclofenac (VOLTAREN) 75 MG EC tablet TAKE 1 TABLET BY MOUTH TWICE A DAY   gabapentin (NEURONTIN) 300 MG capsule Take 300 mg by mouth 2 (two) times daily.   glipiZIDE (GLUCOTROL) 5 MG tablet Take 1 tablet (5 mg total) by mouth 2 (two) times daily before a meal.   glucose blood (ACCU-CHEK GUIDE) test strip USE 1 STRIP TO CHECK BLOOD GLUCOSE TWICE DAILY DX:E11.69   losartan-hydrochlorothiazide (HYZAAR) 50-12.5 MG tablet Take 1 tablet by mouth daily.   metFORMIN (GLUCOPHAGE) 1000 MG tablet TAKE 1 TABLET (1,000 MG TOTAL) BY MOUTH 2 (TWO) TIMES DAILY WITH A MEAL.   montelukast (SINGULAIR) 10 MG tablet TAKE 1 TABLET BY MOUTH EVERYDAY AT BEDTIME   omeprazole (PRILOSEC) 40 MG capsule TAKE 1 CAPSULE (40 MG TOTAL) BY MOUTH DAILY.   rosuvastatin (CRESTOR) 20 MG tablet TAKE 1 TABLET BY MOUTH EVERY DAY   traMADol (ULTRAM) 50 MG tablet TAKE 1 TABLET BY MOUTH EVERY 6 HOURS AS NEEDED   valACYclovir (VALTREX) 500 MG tablet Take 1 tablet (500 mg total)  by mouth 3 (three) times daily.   zolpidem (AMBIEN) 10 MG tablet TAKE 1 TABLET (10 MG TOTAL) BY MOUTH AT BEDTIME AS NEEDED. FOR SLEEP   No facility-administered encounter medications on file as of 11/27/2021.    Allergies (verified) Lisinopril   History: Past Medical History:  Diagnosis Date   Angioedema 06/08/2017   Diabetes mellitus    GERD (gastroesophageal reflux disease)    Hyperlipidemia    Hypertension    Past Surgical History:  Procedure Laterality Date   ABDOMINAL HYSTERECTOMY  1995    and BSO   APPENDECTOMY     BILATERAL OOPHORECTOMY     BREAST BIOPSY Right 02/15/2021   right breast calcs, x marker, path pending   BUNIONECTOMY     CARPAL TUNNEL RELEASE Right    CESAREAN SECTION  1977 and 1978   Family History  Problem Relation Age of Onset   Stroke Mother    Heart murmur Mother    Mitral valve prolapse Mother    Cancer Father 64       carcinoid syndrome   Stomach cancer Father    Breast cancer Neg Hx    Social History   Socioeconomic History   Marital status: Married    Spouse name: Not on file   Number of children: Not on file   Years of education: Not on file   Highest education level: Not on file  Occupational History   Not on file  Tobacco Use  Smoking status: Never   Smokeless tobacco: Never  Vaping Use   Vaping Use: Never used  Substance and Sexual Activity   Alcohol use: No   Drug use: No   Sexual activity: Not on file  Other Topics Concern   Not on file  Social History Narrative   Not on file   Social Determinants of Health   Financial Resource Strain: Low Risk    Difficulty of Paying Living Expenses: Not hard at all  Food Insecurity: No Food Insecurity   Worried About Charity fundraiser in the Last Year: Never true   [place] in the Last Year: Never true  Transportation Needs: No Transportation Needs   Lack of Transportation (Medical): No   Lack of Transportation (Non-Medical): No  Physical Activity: Not on file   Stress: No Stress Concern Present   Feeling of Stress : Not at all  Social Connections: Unknown   Frequency of Communication with Friends and Family: Not on file   Frequency of Social Gatherings with Friends and Family: Not on file   Attends Religious Services: Not on file   Active Member of Clubs or Organizations: Not on file   Attends Archivist Meetings: Not on file   Marital Status: Married    Tobacco Counseling Counseling given: Not Answered   Clinical Intake:  Pre-visit preparation completed: Yes        Diabetes: Yes (Followed by pcp)  How often do you need to have someone help you when you read instructions, pamphlets, or other written materials from your doctor or pharmacy?: 1 - Never    Interpreter Needed?: No      Activities of Daily Living In your present state of health, do you have any difficulty performing the following activities: 11/27/2021  Hearing? N  Vision? N  Difficulty concentrating or making decisions? N  Walking or climbing stairs? N  Dressing or bathing? N  Doing errands, shopping? N  Preparing Food and eating ? N  Using the Toilet? N  In the past six months, have you accidently leaked urine? N  Do you have problems with loss of bowel control? N  Managing your Medications? N  Managing your Finances? N  Housekeeping or managing your Housekeeping? N  Some recent data might be hidden    Patient Care Team: Crecencio Mc, MD as PCP - General (Internal Medicine)  Indicate any recent Medical Services you may have received from other than [facility] providers in the past year (date may be approximate).     Assessment:   This is a routine wellness examination for Sara Mills.  Virtual Visit via Telephone Note  I connected with  Sara Mills on 11/27/21 at 11:15 AM EST by telephone and verified that I am speaking with the correct person using two identifiers.  Persons participating in the virtual visit: patient/Nurse Health  Advisor   I discussed the limitations, risks, security and privacy concerns of performing an evaluation and management service by telephone and the availability of in person appointments. The patient expressed understanding and agreed to proceed.  Interactive audio and video telecommunications were attempted between this nurse and patient, however failed, due to patient having technical difficulties OR patient did not have access to video capability.  We continued and completed visit with audio only.  Some vital signs may be absent or patient reported.   Hearing/Vision screen Hearing Screening - Comments:: Patient is able to hear conversational tones without difficulty.  No issues  reported. Vision Screening - Comments:: Wears corrective lenses  No retinopathy reported They have seen their ophthalmologist in the last 12 months.   Dietary issues and exercise activities discussed: Current Exercise Habits: Home exercise routine, Type of exercise: walking, Intensity: Mild Healthy diet  Good water intake   Goals Addressed               This Visit's Progress     Patient Stated     Increase physical activity (pt-stated)        I would like to lose a little weight       Depression Screen PHQ 2/9 Scores 11/27/2021 10/04/2021 04/04/2021 11/24/2020 11/24/2019 02/17/2019 12/17/2017  PHQ - 2 Score 0 0 0 0 0 0 0  PHQ- 9 Score - - 7 - - 3 9    Fall Risk Fall Risk  11/27/2021 10/04/2021 04/04/2021 11/24/2020 10/04/2020  Falls in the past year? 0 0 0 0 0  Number falls in past yr: - - - 0 -  Injury with Fall? - - - 0 -  Risk for fall due to : - No Fall Risks - - -  Follow up Falls evaluation completed Falls evaluation completed Falls evaluation completed Falls evaluation completed Falls evaluation completed    [place]: Home free of loose throw rugs in walkways, pet beds, electrical cords, etc? Yes  Adequate lighting in your home to reduce risk of falls?  Yes   ASSISTIVE DEVICES UTILIZED TO PREVENT FALLS: Life alert? No  Use of a cane, walker or w/c? No   TIMED UP AND GO: Was the test performed? No .   Cognitive Function:  Patient is alert and oriented x3.    6CIT Screen 11/24/2020 11/24/2019  What Year? 0 points 0 points  What month? 0 points 0 points  What time? 0 points 0 points  Count back from 20 0 points 0 points  Months in reverse 0 points 0 points  Repeat phrase 0 points 0 points  Total Score 0 0    Immunizations Immunization History  Administered Date(s) Administered   Fluad Quad(high Dose 65+) 09/03/2019, 10/02/2020   Hep A / Hep B 07/13/2019, 09/06/2019, 02/11/2020   Influenza Split 10/20/2013   Influenza, High Dose Seasonal PF 09/28/2018, 09/21/2021   Influenza,inj,Quad PF,6+ Mos 08/12/2014, 09/21/2016, 09/08/2017   Influenza-Unspecified 09/08/2012, 09/22/2015   PFIZER(Purple Top)SARS-COV-2 Vaccination 01/01/2020, 01/22/2020, 10/02/2020   Pneumococcal Conjugate-13 08/12/2014   Pneumococcal Polysaccharide-23 09/15/2015   Td 05/01/1998   Tdap 01/10/2014   Zoster Recombinat (Shingrix) 12/21/2018, 02/22/2019   Pneumococcal vaccine status: Declined,  Education has been provided regarding the importance of this vaccine but patient still declined. Advised may receive this vaccine at local pharmacy or Health Dept. Aware to provide a copy of the vaccination record if obtained from local pharmacy or Health Dept. Verbalized acceptance and understanding.  Deferred.   Screening Tests Health Maintenance  Topic Date Due   COLONOSCOPY (Pts 45-64yrs Insurance coverage will need to be confirmed)  11/07/2020   COVID-19 Vaccine (4 - Booster for Pfizer series) 12/13/2021 (Originally 11/27/2020)   Pneumonia Vaccine 63+ Years old (3 - PPSV23 if available, else PCV20) 11/27/2022 (Originally 09/14/2020)   MAMMOGRAM  02/02/2022   OPHTHALMOLOGY EXAM  02/26/2022   FOOT EXAM  04/04/2022   HEMOGLOBIN A1C  04/04/2022   TETANUS/TDAP   01/11/2024   INFLUENZA VACCINE  Completed   DEXA SCAN  Completed   Hepatitis C Screening  Completed   Zoster  Vaccines- Shingrix  Completed   HPV VACCINES  Aged Out   Health Maintenance Health Maintenance Due  Topic Date Due   COLONOSCOPY (Pts 45-6yrs Insurance coverage will need to be confirmed)  11/07/2020   Colonoscopy- patient notes she has contacted GI and reports it is not due until 2023. That office will contact for scheduling. No order placed today.   Mammogram -ordered. Number provided for scheduling [phone]. [facility]. Schedule on or after 02/02/22.   Lung Cancer Screening: (Low Dose CT Chest recommended if Age 85-80 years, 30 pack-year currently smoking OR have quit w/in 15years.) does not qualify.   Vision Screening: Recommended annual ophthalmology exams for early detection of glaucoma and other disorders of the eye.  Dental Screening: Recommended annual dental exams for proper oral hygiene  Community Resource Referral / Chronic Care Management: CRR required this visit?  No   CCM required this visit?  No      Plan:   Keep all routine maintenance appointments.   I have personally reviewed and noted the following in the patients chart:   Medical and social history Use of alcohol, tobacco or illicit drugs  Current medications and supplements including opioid prescriptions. Taking Tramadol. Followed by pcp.  Functional ability and status Nutritional status Physical activity Advanced directives List of other physicians Hospitalizations, surgeries, and ER visits in previous 12 months Vitals Screenings to include cognitive, depression, and falls Referrals and appointments  In addition, I have reviewed and discussed with patient certain preventive protocols, quality metrics, and best practice recommendations. A written personalized care plan for preventive services as well as general preventive health recommendations were provided to  patient.     OBrien-Blaney, Sara Frate L, LPN   [id number]      I have reviewed the above information and agree with above.   Deborra Medina, MD

## 2021-11-28 ENCOUNTER — Other Ambulatory Visit: Payer: Self-pay | Admitting: Internal Medicine

## 2021-12-13 DIAGNOSIS — D485 Neoplasm of uncertain behavior of skin: Secondary | ICD-10-CM | POA: Diagnosis not present

## 2021-12-13 DIAGNOSIS — Z85828 Personal history of other malignant neoplasm of skin: Secondary | ICD-10-CM | POA: Diagnosis not present

## 2021-12-13 DIAGNOSIS — L821 Other seborrheic keratosis: Secondary | ICD-10-CM | POA: Diagnosis not present

## 2021-12-13 DIAGNOSIS — L814 Other melanin hyperpigmentation: Secondary | ICD-10-CM | POA: Diagnosis not present

## 2021-12-13 DIAGNOSIS — D225 Melanocytic nevi of trunk: Secondary | ICD-10-CM | POA: Diagnosis not present

## 2021-12-13 DIAGNOSIS — D1801 Hemangioma of skin and subcutaneous tissue: Secondary | ICD-10-CM | POA: Diagnosis not present

## 2021-12-13 DIAGNOSIS — L570 Actinic keratosis: Secondary | ICD-10-CM | POA: Diagnosis not present

## 2021-12-31 ENCOUNTER — Other Ambulatory Visit: Payer: Self-pay | Admitting: Internal Medicine

## 2021-12-31 NOTE — Telephone Encounter (Signed)
Okay to refill?   LOV: 10/04/21

## 2022-01-14 DIAGNOSIS — H0012 Chalazion right lower eyelid: Secondary | ICD-10-CM | POA: Diagnosis not present

## 2022-01-15 DIAGNOSIS — L814 Other melanin hyperpigmentation: Secondary | ICD-10-CM | POA: Diagnosis not present

## 2022-01-15 DIAGNOSIS — Z85828 Personal history of other malignant neoplasm of skin: Secondary | ICD-10-CM | POA: Diagnosis not present

## 2022-02-02 ENCOUNTER — Other Ambulatory Visit: Payer: Self-pay | Admitting: Internal Medicine

## 2022-02-04 ENCOUNTER — Other Ambulatory Visit: Payer: Self-pay | Admitting: Internal Medicine

## 2022-02-06 DIAGNOSIS — Z20822 Contact with and (suspected) exposure to covid-19: Secondary | ICD-10-CM | POA: Diagnosis not present

## 2022-02-11 ENCOUNTER — Other Ambulatory Visit: Payer: Self-pay | Admitting: Internal Medicine

## 2022-02-23 ENCOUNTER — Other Ambulatory Visit: Payer: Self-pay | Admitting: Internal Medicine

## 2022-03-12 ENCOUNTER — Ambulatory Visit
Admission: RE | Admit: 2022-03-12 | Discharge: 2022-03-12 | Disposition: A | Discharge status: Home / Self Care | Payer: Medicare Other | Source: Ambulatory Visit | Attending: Internal Medicine | Admitting: Internal Medicine

## 2022-03-12 DIAGNOSIS — Z1231 Encounter for screening mammogram for malignant neoplasm of breast: Secondary | ICD-10-CM | POA: Diagnosis not present

## 2022-03-26 DIAGNOSIS — Z20822 Contact with and (suspected) exposure to covid-19: Secondary | ICD-10-CM | POA: Diagnosis not present

## 2022-04-04 ENCOUNTER — Encounter: Payer: Self-pay | Admitting: Internal Medicine

## 2022-04-04 ENCOUNTER — Ambulatory Visit (INDEPENDENT_AMBULATORY_CARE_PROVIDER_SITE_OTHER): Payer: Medicare Other | Admitting: Internal Medicine

## 2022-04-04 VITALS — BP 118/78 | HR 72 | Temp 97.7°F | Ht 61.0 in | Wt 148.6 lb

## 2022-04-04 DIAGNOSIS — E785 Hyperlipidemia, unspecified: Secondary | ICD-10-CM | POA: Diagnosis not present

## 2022-04-04 DIAGNOSIS — R921 Mammographic calcification found on diagnostic imaging of breast: Secondary | ICD-10-CM

## 2022-04-04 DIAGNOSIS — E669 Obesity, unspecified: Secondary | ICD-10-CM | POA: Diagnosis not present

## 2022-04-04 DIAGNOSIS — D126 Benign neoplasm of colon, unspecified: Secondary | ICD-10-CM | POA: Diagnosis not present

## 2022-04-04 DIAGNOSIS — K760 Fatty (change of) liver, not elsewhere classified: Secondary | ICD-10-CM

## 2022-04-04 DIAGNOSIS — H9312 Tinnitus, left ear: Secondary | ICD-10-CM

## 2022-04-04 DIAGNOSIS — M4722 Other spondylosis with radiculopathy, cervical region: Secondary | ICD-10-CM | POA: Diagnosis not present

## 2022-04-04 DIAGNOSIS — E1169 Type 2 diabetes mellitus with other specified complication: Secondary | ICD-10-CM

## 2022-04-04 DIAGNOSIS — G8929 Other chronic pain: Secondary | ICD-10-CM | POA: Diagnosis not present

## 2022-04-04 DIAGNOSIS — I10 Essential (primary) hypertension: Secondary | ICD-10-CM

## 2022-04-04 DIAGNOSIS — M79672 Pain in left foot: Secondary | ICD-10-CM

## 2022-04-04 LAB — COMPREHENSIVE METABOLIC PANEL
ALT: 45 U/L — ABNORMAL HIGH (ref 0–35)
AST: 48 U/L — ABNORMAL HIGH (ref 0–37)
Albumin: 4.7 g/dL (ref 3.5–5.2)
Alkaline Phosphatase: 84 U/L (ref 39–117)
BUN: 29 mg/dL — ABNORMAL HIGH (ref 6–23)
CO2: 30 mEq/L (ref 19–32)
Calcium: 9.6 mg/dL (ref 8.4–10.5)
Chloride: 100 mEq/L (ref 96–112)
Creatinine, Ser: 1.24 mg/dL — ABNORMAL HIGH (ref 0.40–1.20)
GFR: 44.66 mL/min — ABNORMAL LOW (ref >60.00)
Glucose, Bld: 145 mg/dL — ABNORMAL HIGH (ref 70–99)
Potassium: 4 mEq/L (ref 3.5–5.1)
Sodium: 139 mEq/L (ref 135–145)
Total Bilirubin: 0.6 mg/dL (ref 0.2–1.2)
Total Protein: 7.4 g/dL (ref 6.0–8.3)

## 2022-04-04 LAB — LIPID PANEL
Cholesterol: 151 mg/dL (ref 0–200)
HDL: 41.2 mg/dL (ref >39.00)
NonHDL: 109.51
Total CHOL/HDL Ratio: 4
Triglycerides: 201 mg/dL — ABNORMAL HIGH (ref 0.0–149.0)
VLDL: 40.2 mg/dL — ABNORMAL HIGH (ref 0.0–40.0)

## 2022-04-04 LAB — MICROALBUMIN / CREATININE URINE RATIO
Creatinine,U: 203.8 mg/dL
Microalb Creat Ratio: 1.7 mg/g (ref 0.0–30.0)
Microalb, Ur: 3.4 mg/dL — ABNORMAL HIGH (ref 0.0–1.9)

## 2022-04-04 LAB — LDL CHOLESTEROL, DIRECT: Direct LDL: 85 mg/dL

## 2022-04-04 LAB — HEMOGLOBIN A1C: Hgb A1c MFr Bld: 7.2 % — ABNORMAL HIGH (ref 4.6–6.5)

## 2022-04-04 MED ORDER — METFORMIN HCL ER 750 MG PO TB24: 750.0000 mg | ORAL_TABLET | Freq: Every day | ORAL | 2 refills | Status: DC

## 2022-04-04 MED ORDER — TRAMADOL HCL 50 MG PO TABS: 50.0000 mg | ORAL_TABLET | Freq: Two times a day (BID) | ORAL | 5 refills | Status: DC

## 2022-04-04 MED ORDER — VALACYCLOVIR HCL 500 MG PO TABS: 500.0000 mg | ORAL_TABLET | Freq: Three times a day (TID) | ORAL | 2 refills | Status: DC

## 2022-04-04 NOTE — Assessment & Plan Note (Addendum)
MRI cervical spine was done by [facility] In April 2022.  Results requested but not received.  Cervical spondylosis suggested by office note produced by patient. (see above)   She has no loss of strength on exam today but reports dropping things a lot and having intermittend numbness and tingling of both upper extremeties.  Advised to resume PT exercises given to her last year by PT  .  Tramadol refilled

## 2022-04-04 NOTE — Assessment & Plan Note (Signed)
Metformin causing daily diarrhea,  So she has been suspending glip and met on days her husband has doctor;s appts.  Which is frequent;ly.  BS have been elevated per patient .  Switching metformin to XR and advised NOT to suspend glipizide unless not eating  Labs pending .. foot exam normal  ?

## 2022-04-04 NOTE — Assessment & Plan Note (Signed)
LDL has been  at goal on rosuvastatin 20 mg daily .  Repeat lipids aee pending  ?  ?Lab Results  ?Component Value Date  ? CHOL 148 10/04/2021  ? HDL 42.60 10/04/2021  ? [place] 72 05/23/2020  ? LDLDIRECT 79.0 10/04/2021  ? TRIG 201.0 (H) 10/04/2021  ? CHOLHDL 3 10/04/2021  ? ?Lab Results  ?Component Value Date  ? ALT 42 (H) 10/04/2021  ? AST 35 10/04/2021  ? ALKPHOS 70 10/04/2021  ? BILITOT 0.6 10/04/2021  ? ? ?

## 2022-04-04 NOTE — Patient Instructions (Addendum)
Rethink your yard work,  for both your hearing and your neck's sake ? ? ?Tramadol and valacyclovir refilled ? ?ado ?

## 2022-04-04 NOTE — Assessment & Plan Note (Addendum)
TA on 2016 colonsocopy by [place] man,, overdue for 5 yr follow up.  Referral has been made but office is telling her she is not due yet.  Message sent directly to Dr Collene Mares ?

## 2022-04-04 NOTE — Assessment & Plan Note (Signed)
Secondary to hearing loss per ENT.  Uses leaf blowers and weed eaters regularly without ear protection.  Advice given

## 2022-04-04 NOTE — Progress Notes (Signed)
? ?Subjective:  ?Patient ID: Sara Mills, female    DOB: 08/07/1951  Age: 71 y.o. MRN: [id number] ? ?CC: The primary encounter diagnosis was Essential hypertension. Diagnoses of Hyperlipidemia due to type 2 diabetes mellitus ([place]), Diabetes mellitus type 2 in obese [facility]), Chronic foot pain, left, Cervical spondylosis with radiculopathy, Breast calcifications on mammogram, Benign neoplasm of colon, unspecified part of colon, and Tinnitus aurium, left were also pertinent to this visit. ? ? ?This visit occurred during the SARS-CoV-2 public health emergency.  Safety protocols were in place, including screening questions prior to the visit, additional usage of staff PPE, and extensive cleaning of exam room while observing appropriate contact time as indicated for disinfecting solutions.   ? ?HPI ?Sara Mills presents for  ?Chief Complaint  ?Patient presents with  ? Follow-up  ? ?1) hears a "cricket" in left ear that started a month ago. Uses a leaf blower on a daily without ear protection . Occurs every year this time when earwax build up.  Saw Tami Ribas last year and hearing loss noted.  ? ?2) DM:    taking glipizide and metformin except  ? ?30 left foot discomfort : feels like she is walking on a pebble.  Buring pain on dorsum of foot and has developed a separation between toes 3 and 4  ? ?4) neck pain is getting worse.  Had MRI by [facility] last April ,  cervical spondylosis was referred by Nelva Bush to neurosurgeon Rolena Infante,  who recommended PT  .  Not doing the home exercises and pain is worse  by the end of the day  better by morning.  Does not use a neck pillow.  She  does not have neck pain in the morning  but hadns arms become intermittently numb  has not had ESI Using tramadol sparingly , needs refill  ?Outpatient Medications Prior to Visit  ?Medication Sig Dispense Refill  ? Accu-Chek FastClix Lancets MISC USE 1 LANCET TO CHECK GLUCOSE TWICE DAILY 102 each 1  ? diclofenac (VOLTAREN) 75 MG EC tablet TAKE 1  TABLET BY MOUTH TWICE A DAY 60 tablet 2  ? gabapentin (NEURONTIN) 300 MG capsule Take 300 mg by mouth 2 (two) times daily.    ? glipiZIDE (GLUCOTROL) 5 MG tablet TAKE 1/2 TABLET (2.5 MG TOTAL) BY MOUTH TWICE DAILY BEFORE A MEAL 90 tablet 1  ? glucose blood (ACCU-CHEK GUIDE) test strip USE 1 STRIP TO CHECK BLOOD GLUCOSE TWICE A DAY 100 strip 1  ? losartan-hydrochlorothiazide (HYZAAR) 50-12.5 MG tablet TAKE 1 TABLET BY MOUTH EVERY DAY 90 tablet 1  ? montelukast (SINGULAIR) 10 MG tablet TAKE 1 TABLET BY MOUTH EVERYDAY AT BEDTIME 90 tablet 1  ? omeprazole (PRILOSEC) 40 MG capsule TAKE 1 CAPSULE (40 MG TOTAL) BY MOUTH DAILY. 90 capsule 1  ? rosuvastatin (CRESTOR) 20 MG tablet TAKE 1 TABLET BY MOUTH EVERY DAY 90 tablet 1  ? zolpidem (AMBIEN) 10 MG tablet TAKE 1 TABLET (10 MG TOTAL) BY MOUTH AT BEDTIME AS NEEDED. FOR SLEEP 30 tablet 5  ? metFORMIN (GLUCOPHAGE) 1000 MG tablet TAKE 1 TABLET (1,000 MG TOTAL) BY MOUTH 2 (TWO) TIMES DAILY WITH A MEAL. 180 tablet 1  ? traMADol (ULTRAM) 50 MG tablet TAKE 1 TABLET BY MOUTH EVERY 6 HOURS AS NEEDED 30 tablet 0  ? valACYclovir (VALTREX) 500 MG tablet Take 1 tablet (500 mg total) by mouth 3 (three) times daily. 21 tablet 2  ? ?No facility-administered medications prior to visit.  ? ? ?  Review of Systems; ? ?Patient denies headache, fevers, malaise, unintentional weight loss, skin rash, eye pain, sinus congestion and sinus pain, sore throat, dysphagia,  hemoptysis , cough, dyspnea, wheezing, chest pain, palpitations, orthopnea, edema, abdominal pain, nausea, melena, diarrhea, constipation, flank pain, dysuria, hematuria, urinary  Frequency, nocturia, numbness, tingling, seizures,  Focal weakness, Loss of consciousness,  Tremor, insomnia, depression, anxiety, and suicidal ideation.   ? ? ? ?Objective:  ?BP 118/78 (BP Location: Left Arm, Patient Position: Sitting, Cuff Size: Small)   Pulse 72   Temp 97.7 ?F (36.5 ?C) (Oral)   Ht _0  (1.549 m)   Wt 148 lb 9.6 oz (67.4 kg)   SpO2 97%    BMI 28.08 kg/m?  ? ?BP Readings from Last 3 Encounters:  ?04/04/22 118/78  ?10/04/21 (!) 142/80  ?08/29/21 119/76  ? ? ?Wt Readings from Last 3 Encounters:  ?04/04/22 148 lb 9.6 oz (67.4 kg)  ?11/27/21 152 lb (68.9 kg)  ?10/04/21 152 lb 12.8 oz (69.3 kg)  ? ? ?General appearance: alert, cooperative and appears stated age ?Ears: normal TM's and external ear canals both ears ?Throat: lips, mucosa, and tongue normal; teeth and gums normal ?Neck: no adenopathy, no carotid bruit, supple, symmetrical, trachea midline and thyroid not enlarged, symmetric, no tenderness/mass/nodules ?Back: symmetric, no curvature. ROM normal. No CVA tenderness. ?Lungs: clear to auscultation bilaterally ?Heart: regular rate and rhythm, S1, S2 normal, no murmur, click, rub or gallop ?Abdomen: soft, non-tender; bowel sounds normal; no masses,  no organomegaly ?Pulses: 2+ and symmetric ?Skin: Skin color, texture, turgor normal. No rashes or lesions ?Lymph nodes: Cervical, supraclavicular, and axillary nodes normal. ?Neuro: CNs 2-12 intact. DTRs 2+/4 in biceps, brachioradialis, patellars and achilles. Muscle strength 5/5 in upper and lower exremities. Fine resting tremor bilaterally both hands cerebellar function normal. Romberg negative.  No pronator drift.   Gait normal.   ? ?Lab Results  ?Component Value Date  ? HGBA1C 6.9 (H) 10/04/2021  ? HGBA1C 7.4 (H) 04/04/2021  ? HGBA1C 7.3 (H) 10/04/2020  ? ? ?Lab Results  ?Component Value Date  ? CREATININE 1.05 10/04/2021  ? CREATININE 1.46 (H) 04/04/2021  ? CREATININE 1.02 10/04/2020  ? ? ?Lab Results  ?Component Value Date  ? WBC 5.2 11/26/2019  ? HGB 12.2 11/26/2019  ? HCT 37.0 11/26/2019  ? PLT 123.0 (L) 11/26/2019  ? GLUCOSE 84 10/04/2021  ? CHOL 148 10/04/2021  ? TRIG 201.0 (H) 10/04/2021  ? HDL 42.60 10/04/2021  ? LDLDIRECT 79.0 10/04/2021  ? [place] 72 05/23/2020  ? ALT 42 (H) 10/04/2021  ? AST 35 10/04/2021  ? NA 140 10/04/2021  ? K 3.9 10/04/2021  ? CL 102 10/04/2021  ? CREATININE 1.05  10/04/2021  ? BUN 21 10/04/2021  ? CO2 30 10/04/2021  ? TSH 1.47 04/04/2021  ? HGBA1C 6.9 (H) 10/04/2021  ? MICROALBUR 2.3 (H) 04/04/2021  ? ? ?Mammogram 3D SCREEN BREAST BILATERAL ? ?Result Date: 03/12/2022 ?CLINICAL DATA:  Screening. EXAM: DIGITAL SCREENING BILATERAL MAMMOGRAM WITH TOMOSYNTHESIS AND CAD TECHNIQUE: Bilateral screening digital craniocaudal and mediolateral oblique mammograms were obtained. Bilateral screening digital breast tomosynthesis was performed. The images were evaluated with computer-aided detection. COMPARISON:  Previous exam(s). ACR Breast Density Category b: There are scattered areas of fibroglandular density. FINDINGS: There are no findings suspicious for malignancy. IMPRESSION: No mammographic evidence of malignancy. A result letter of this screening mammogram will be mailed directly to the patient. RECOMMENDATION: Screening mammogram in one year. (Code:SM-B-01Y) BI-RADS CATEGORY  1: Negative. Electronically Signed  By: Dorise Bullion III M.D.   On: 03/12/2022 17:33  ? ? ?Assessment & Plan:  ? ?Problem List Items Addressed This Visit   ? ? Breast calcifications on mammogram  ? Cervical spondylosis with radiculopathy  ?  MRI cervical spine was done by [facility] In April 2022.  Results requested but not received.  Cervical spondylosis suggested by office note produced by patient. (see above)   She has no loss of strength on exam today but reports dropping things a lot and having intermittend numbness and tingling of both upper extremeties.  Advised to resume PT exercises given to her last year by PT  .  Tramadol refilled  ? ?  ?  ? Relevant Medications  ? traMADol (ULTRAM) 50 MG tablet  ? COLONIC POLYPS  ?  TA on 2016 colonsocopy by [place] man,, overdue for 5 yr follow up.  Referral has been made but office is telling her she is not due yet.  Message sent directly to Dr Collene Mares ? ?  ?  ? Diabetes mellitus type 2 in obese [facility])  ?  Metformin causing daily diarrhea,  So she has been  suspending glip and met on days her husband has doctor;s appts.  Which is frequent;ly.  BS have been elevated per patient .  Switching metformin to XR and advised NOT to suspend glipizide unless not eating  Labs pendi

## 2022-04-05 NOTE — Addendum Note (Signed)
Addended by: Crecencio Mc on: 04/05/2022 01:39 PM ? ? Modules accepted: Orders ? ?

## 2022-04-09 ENCOUNTER — Telehealth: Payer: Self-pay

## 2022-04-09 ENCOUNTER — Ambulatory Visit (INDEPENDENT_AMBULATORY_CARE_PROVIDER_SITE_OTHER): Payer: Medicare Other

## 2022-04-09 ENCOUNTER — Ambulatory Visit: Payer: PRIVATE HEALTH INSURANCE | Admitting: Podiatry

## 2022-04-09 ENCOUNTER — Ambulatory Visit (INDEPENDENT_AMBULATORY_CARE_PROVIDER_SITE_OTHER): Payer: Medicare Other | Admitting: Podiatry

## 2022-04-09 DIAGNOSIS — G5762 Lesion of plantar nerve, left lower limb: Secondary | ICD-10-CM

## 2022-04-09 DIAGNOSIS — H9312 Tinnitus, left ear: Secondary | ICD-10-CM | POA: Diagnosis not present

## 2022-04-09 NOTE — Telephone Encounter (Signed)
-----   Message from Crecencio Mc, MD sent at 04/05/2022  1:38 PM EDT ----- ?Liver enzymes are mildly elevated and kidney function is down again.   ?A1c has risen slightly to 7.2 and your urine is still positive for protein (small amount) ? ?Please return in one month for nonfasting labs to recheck kidney and liver ?

## 2022-04-09 NOTE — Telephone Encounter (Signed)
LMTCB. Need to scheduled pt a lab appt in 1 month.  ?

## 2022-04-09 NOTE — Progress Notes (Signed)
? ?  HPI: 71 y.o. female presenting today as a new patient for evaluation of low-grade chronic pain to the left forefoot this been going on for about 1 year now.  Patient states that she develops intermittent pain to the left forefoot based on her activity levels.  She has not really done anything for treatment.  She presents for further treatment and evaluation ? ?Past Medical History:  ?Diagnosis Date  ? Angioedema 06/08/2017  ? Diabetes mellitus   ? GERD (gastroesophageal reflux disease)   ? Hyperlipidemia   ? Hypertension   ? ? ?Past Surgical History:  ?Procedure Laterality Date  ? ABDOMINAL HYSTERECTOMY  1995  ? and BSO  ? APPENDECTOMY    ? BILATERAL OOPHORECTOMY    ? BREAST BIOPSY Right 02/15/2021  ? right breast calcs, x marker, BENIGN BREAST TISSUE WITH FIBROADENOMATOID CHANGES ANDASSOCIATED CALCIFICATIONS. - NEGATIVE FOR ATYPIA AND MALIGNANCY  ? BUNIONECTOMY    ? CARPAL TUNNEL RELEASE Right   ? [place]  ? ? ?Allergies  ?Allergen Reactions  ? Lisinopril   ?  Unexplained Angioedema Apr 30 2017  ? ?  ?Physical Exam: ?General: The patient is alert and oriented x3 in no acute distress. ? ?Dermatology: Skin is warm, dry and supple bilateral lower extremities. Negative for open lesions or macerations. ? ?Vascular: Palpable pedal pulses bilaterally. Capillary refill within normal limits.  Negative for any significant edema or erythema ? ?Neurological: Light touch and protective threshold grossly intact ? ?Musculoskeletal Exam: No pedal deformities noted.  There is some tenderness to palpation to the second interspace of the left foot as well as tenderness with lateral compression of the metatarsal heads. ? ?Radiographic Exam:  ?Normal osseous mineralization. Joint spaces preserved. No fracture/dislocation/boney destruction.   ? ?Assessment: ?1. Morton's neuroma second interspace left foot ? ? ?Plan of Care:  ?1. Patient evaluated. X-Rays reviewed.  For now we are going to pursue very  conservative treatment.  Patient declined any oral anti-inflammatories or injections.  She says the pain does not get that bad.  It is very intermittent. ?2. Offloading felt metatarsal pads dispensed to offload pressure from the forefoot ?3.  OTC power step insoles were dispensed to support the medial longitudinal arch of the foot and offload pressure from the forefoot ?4.  Recommend good supportive shoes and sneakers that support the foot and allow plenty of room in the toebox ?5.  Return to clinic as needed ? ? ?  ?  ?Edrick Kins, DPM ?[place] ? ?Dr. Edrick Kins, DPM  ?  ?[address]                                        ?[place], [place] [address]                ?Office [phone]  ?Fax [phone] ? ? ? ? ?

## 2022-04-09 NOTE — Telephone Encounter (Signed)
Patient given lab results . She voiced understanding . She has a follow up lab schedule in 1 month on 6.2.23. ?

## 2022-04-10 NOTE — Telephone Encounter (Signed)
noted 

## 2022-04-12 ENCOUNTER — Other Ambulatory Visit: Payer: Self-pay | Admitting: Internal Medicine

## 2022-05-10 ENCOUNTER — Other Ambulatory Visit (INDEPENDENT_AMBULATORY_CARE_PROVIDER_SITE_OTHER): Payer: Medicare Other

## 2022-05-10 DIAGNOSIS — K760 Fatty (change of) liver, not elsewhere classified: Secondary | ICD-10-CM

## 2022-05-10 DIAGNOSIS — N289 Disorder of kidney and ureter, unspecified: Secondary | ICD-10-CM

## 2022-05-10 LAB — COMPREHENSIVE METABOLIC PANEL
ALT: 29 U/L (ref 0–35)
AST: 29 U/L (ref 0–37)
Albumin: 4.4 g/dL (ref 3.5–5.2)
Alkaline Phosphatase: 78 U/L (ref 39–117)
BUN: 34 mg/dL — ABNORMAL HIGH (ref 6–23)
CO2: 29 mEq/L (ref 19–32)
Calcium: 9.5 mg/dL (ref 8.4–10.5)
Chloride: 101 mEq/L (ref 96–112)
Creatinine, Ser: 1.22 mg/dL — ABNORMAL HIGH (ref 0.40–1.20)
GFR: 45.51 mL/min — ABNORMAL LOW (ref >60.00)
Glucose, Bld: 116 mg/dL — ABNORMAL HIGH (ref 70–99)
Potassium: 3.7 mEq/L (ref 3.5–5.1)
Sodium: 140 mEq/L (ref 135–145)
Total Bilirubin: 0.6 mg/dL (ref 0.2–1.2)
Total Protein: 7.6 g/dL (ref 6.0–8.3)

## 2022-05-11 DIAGNOSIS — N289 Disorder of kidney and ureter, unspecified: Secondary | ICD-10-CM | POA: Insufficient documentation

## 2022-05-14 ENCOUNTER — Other Ambulatory Visit: Payer: Self-pay | Admitting: Family

## 2022-05-20 ENCOUNTER — Ambulatory Visit (INDEPENDENT_AMBULATORY_CARE_PROVIDER_SITE_OTHER): Payer: Medicare Other | Admitting: Internal Medicine

## 2022-05-20 ENCOUNTER — Encounter: Payer: Self-pay | Admitting: Internal Medicine

## 2022-05-20 VITALS — BP 134/76 | HR 72 | Temp 98.1°F | Ht 61.0 in | Wt 148.0 lb

## 2022-05-20 DIAGNOSIS — E1169 Type 2 diabetes mellitus with other specified complication: Secondary | ICD-10-CM | POA: Diagnosis not present

## 2022-05-20 DIAGNOSIS — M5126 Other intervertebral disc displacement, lumbar region: Secondary | ICD-10-CM | POA: Diagnosis not present

## 2022-05-20 DIAGNOSIS — M4722 Other spondylosis with radiculopathy, cervical region: Secondary | ICD-10-CM | POA: Diagnosis not present

## 2022-05-20 DIAGNOSIS — N1831 Chronic kidney disease, stage 3a: Secondary | ICD-10-CM

## 2022-05-20 DIAGNOSIS — E785 Hyperlipidemia, unspecified: Secondary | ICD-10-CM

## 2022-05-20 DIAGNOSIS — N289 Disorder of kidney and ureter, unspecified: Secondary | ICD-10-CM | POA: Diagnosis not present

## 2022-05-20 DIAGNOSIS — Z1211 Encounter for screening for malignant neoplasm of colon: Secondary | ICD-10-CM | POA: Diagnosis not present

## 2022-05-20 LAB — RENAL FUNCTION PANEL
Albumin: 4.4 g/dL (ref 3.5–5.2)
BUN: 20 mg/dL (ref 6–23)
CO2: 28 mEq/L (ref 19–32)
Calcium: 9.8 mg/dL (ref 8.4–10.5)
Chloride: 100 mEq/L (ref 96–112)
Creatinine, Ser: 1.05 mg/dL (ref 0.40–1.20)
GFR: 54.47 mL/min — ABNORMAL LOW (ref >60.00)
Glucose, Bld: 95 mg/dL (ref 70–99)
Phosphorus: 3.7 mg/dL (ref 2.3–4.6)
Potassium: 3.7 mEq/L (ref 3.5–5.1)
Sodium: 140 mEq/L (ref 135–145)

## 2022-05-20 NOTE — Assessment & Plan Note (Signed)
Repeat BMET today after suspension of NSAID.  If still < 60 ml/min will refer to nephrology

## 2022-05-20 NOTE — Patient Instructions (Addendum)
Continue 1000 mg metformin twice daily   DO NOT SKIP MEALS!  THE GLIPIZIDE WILL DROP YOUR SUGAR  If your kidney function has not returned to normal on today's labs,  I will make a referral to nephrology

## 2022-05-20 NOTE — Assessment & Plan Note (Signed)
May be multifactorial  Due to DN. HTN  NSAID use. Nephrology referral advised if no improvement afte rstopping diclofenac

## 2022-05-20 NOTE — Assessment & Plan Note (Signed)
DM is well contorlled on metformin and glipizide.  SGLT 2 transport inhibitors are Tier 6 .; no changes today return in    September

## 2022-05-20 NOTE — Assessment & Plan Note (Signed)
Managed for years with diclofenac . Her pain has increased since stopping  the medication.  Tramadol authorized to use alng wit 500 mg tylenol bid.  Follow up with Dr Nelva Bush at [facility] for [facility]

## 2022-05-20 NOTE — Progress Notes (Signed)
Subjective:  Patient ID: Sara Mills, female    DOB: [birth year]  Age: 71 y.o. MRN: [id number]  CC: The primary encounter diagnosis was Colon cancer screening. Diagnoses of Stage 3a chronic kidney disease ([place]), Cervical spondylosis with radiculopathy, Renal insufficiency, mild, and Hyperlipidemia due to type 2 diabetes mellitus ([place]) were also pertinent to this visit.   HPI Sara Mills presents for  discussion of recent labs indicating a drop in GFR.  She is accompanied by her husband Chief Complaint  Patient presents with   Follow-up    Follow up to discuss lab results    Sara Mills is a 71 yr old female with type 2 DM, hypertension and hyperlipidemia fatty liver,   and chronic back pain managed with NSAIDs (daily)  who returns for discussion of recent follow up labs.   1) DM follow up:  she has switched from metformin R at last visit to metformin IR  .  CBGS better controlled on short acting metformin 1000 mg bid  bc with the XR dose of 750 mg her morning blood sugars  were near 200.  Has had a few lows if she is fasting.  Taking 2.5 mg glipizide twice DAILY    2) HTN:  taking losartan hct  . Home readings   have been consistently under  140/90 but not below systolic 629.   3) CKD:  new onset  this year.  She has been using daily diclofenac  use for years.  GFR was > 60 ml/min until 2022,  now has been  <50 ml/min for the past 6 months.  Stopped diclofenac  one week ago.  Has taken tramadol because she is having increase  d pain and afraid to take tylenol   Outpatient Medications Prior to Visit  Medication Sig Dispense Refill   Accu-Chek FastClix Lancets MISC USE 1 LANCET TO CHECK GLUCOSE TWICE DAILY 102 each 1   gabapentin (NEURONTIN) 300 MG capsule Take 300 mg by mouth 2 (two) times daily.     glipiZIDE (GLUCOTROL) 5 MG tablet TAKE 1/2 TABLET (2.5 MG TOTAL) BY MOUTH TWICE DAILY BEFORE A MEAL 90 tablet 1   glucose blood (ACCU-CHEK GUIDE) test strip USE 1 STRIP TO CHECK BLOOD  GLUCOSE ONCE A DAY 100 strip 1   losartan-hydrochlorothiazide (HYZAAR) 50-12.5 MG tablet TAKE 1 TABLET BY MOUTH EVERY DAY 90 tablet 1   metFORMIN (GLUCOPHAGE) 1000 MG tablet Take 1,000 mg by mouth 2 (two) times daily with a meal.     omeprazole (PRILOSEC) 40 MG capsule TAKE 1 CAPSULE (40 MG TOTAL) BY MOUTH DAILY. 90 capsule 1   rosuvastatin (CRESTOR) 20 MG tablet TAKE 1 TABLET BY MOUTH EVERY DAY 90 tablet 1   traMADol (ULTRAM) 50 MG tablet Take 1 tablet (50 mg total) by mouth 2 (two) times daily. 60 tablet 5   valACYclovir (VALTREX) 500 MG tablet Take 1 tablet (500 mg total) by mouth 3 (three) times daily. 21 tablet 2   zolpidem (AMBIEN) 10 MG tablet TAKE 1 TABLET (10 MG TOTAL) BY MOUTH AT BEDTIME AS NEEDED. FOR SLEEP 30 tablet 5   montelukast (SINGULAIR) 10 MG tablet TAKE 1 TABLET BY MOUTH EVERYDAY AT BEDTIME 90 tablet 1   diclofenac (VOLTAREN) 75 MG EC tablet TAKE 1 TABLET BY MOUTH TWICE A DAY (Patient not taking: Reported on 05/20/2022) 60 tablet 2   metFORMIN (GLUCOPHAGE-XR) 750 MG 24 hr tablet Take 1 tablet (750 mg total) by mouth daily with breakfast. (Patient not taking: Reported  on 05/20/2022) 30 tablet 2   No facility-administered medications prior to visit.    Review of Systems;  Patient denies headache, fevers, malaise, unintentional weight loss, skin rash, eye pain, sinus congestion and sinus pain, sore throat, dysphagia,  hemoptysis , cough, dyspnea, wheezing, chest pain, palpitations, orthopnea, edema, abdominal pain, nausea, melena, diarrhea, constipation, flank pain, dysuria, hematuria, urinary  Frequency, nocturia, numbness, tingling, seizures,  Focal weakness, Loss of consciousness,  Tremor, insomnia, depression, anxiety, and suicidal ideation.      Objective:  BP 134/76 (BP Location: Left Arm, Patient Position: Sitting, Cuff Size: Normal)   Pulse 72   Temp 98.1 F (36.7 C) (Oral)   Ht '5\' 1"'$  (1.549 m)   Wt 148 lb (67.1 kg)   SpO2 97%   BMI 27.96 kg/m   BP Readings from  Last 3 Encounters:  05/20/22 134/76  04/04/22 118/78  10/04/21 (!) 142/80    Wt Readings from Last 3 Encounters:  05/20/22 148 lb (67.1 kg)  04/04/22 148 lb 9.6 oz (67.4 kg)  11/27/21 152 lb (68.9 kg)    General appearance: alert, cooperative and appears stated age Ears: normal TM's and external ear canals both ears Throat: lips, mucosa, and tongue normal; teeth and gums normal Neck: no adenopathy, no carotid bruit, supple, symmetrical, trachea midline and thyroid not enlarged, symmetric, no tenderness/mass/nodules Back: symmetric, no curvature. ROM normal. No CVA tenderness. Lungs: clear to auscultation bilaterally Heart: regular rate and rhythm, S1, S2 normal, no murmur, click, rub or gallop Abdomen: soft, non-tender; bowel sounds normal; no masses,  no organomegaly Pulses: 2+ and symmetric Skin: Skin color, texture, turgor normal. No rashes or lesions Lymph nodes: Cervical, supraclavicular, and axillary nodes normal.  Lab Results  Component Value Date   HGBA1C 7.2 (H) 04/04/2022   HGBA1C 6.9 (H) 10/04/2021   HGBA1C 7.4 (H) 04/04/2021    Lab Results  Component Value Date   CREATININE 1.22 (H) 05/10/2022   CREATININE 1.24 (H) 04/04/2022   CREATININE 1.05 10/04/2021    Lab Results  Component Value Date   WBC 5.2 11/26/2019   HGB 12.2 11/26/2019   HCT 37.0 11/26/2019   PLT 123.0 (L) 11/26/2019   GLUCOSE 116 (H) 05/10/2022   CHOL 151 04/04/2022   TRIG 201.0 (H) 04/04/2022   HDL 41.20 04/04/2022   LDLDIRECT 85.0 04/04/2022   LDLCALC 72 05/23/2020   ALT 29 05/10/2022   AST 29 05/10/2022   NA 140 05/10/2022   K 3.7 05/10/2022   CL 101 05/10/2022   CREATININE 1.22 (H) 05/10/2022   BUN 34 (H) 05/10/2022   CO2 29 05/10/2022   TSH 1.47 04/04/2021   HGBA1C 7.2 (H) 04/04/2022   MICROALBUR 3.4 (H) 04/04/2022    Mammogram 3D SCREEN BREAST BILATERAL  Result Date: 03/12/2022 CLINICAL DATA:  Screening. EXAM: DIGITAL SCREENING BILATERAL MAMMOGRAM WITH TOMOSYNTHESIS AND  CAD TECHNIQUE: Bilateral screening digital craniocaudal and mediolateral oblique mammograms were obtained. Bilateral screening digital breast tomosynthesis was performed. The images were evaluated with computer-aided detection. COMPARISON:  Previous exam(s). ACR Breast Density Category b: There are scattered areas of fibroglandular density. FINDINGS: There are no findings suspicious for malignancy. IMPRESSION: No mammographic evidence of malignancy. A result letter of this screening mammogram will be mailed directly to the patient. RECOMMENDATION: Screening mammogram in one year. (Code:SM-B-01Y) BI-RADS CATEGORY  1: Negative. Electronically Signed   By: Dorise Bullion III M.D.   On: 03/12/2022 17:33    Assessment & Plan:   Problem List Items Addressed This Visit  Stage 3a chronic kidney disease ([place])    May be multifactorial  Due to DN. HTN  NSAID use. Nephrology referral advised if no improvement afte rstopping diclofenac       Relevant Orders   Renal function panel   Renal insufficiency, mild    Repeat BMET today after suspension of NSAID.  If still < 60 ml/min will refer to nephrology       Hyperlipidemia due to type 2 diabetes mellitus ([place])    DM is well contorlled on metformin and glipizide.  SGLT 2 transport inhibitors are Tier 6 .; no changes today return in    September       Relevant Medications   metFORMIN (GLUCOPHAGE) 1000 MG tablet   Cervical spondylosis with radiculopathy    Managed for years with diclofenac . Her pain has increased since stopping  the medication.  Tramadol authorized to use alng wit 500 mg tylenol bid.  Follow up with Dr Nelva Bush at [facility] for [facility]       Other Visit Diagnoses     Colon cancer screening    -  Primary   Relevant Orders   Ambulatory referral to Gastroenterology       I spent a total of   minutes with this patient in a face to face visit on the date of this encounter reviewing the last office visit with me on        ,  most  recent with patient's cardiologist in    ,  patient'ss diet and eating habits, home blood pressure readings ,  most recent imaging study ,   and post visit ordering of testing and therapeutics.    Follow-up: Return in about 3 months (around 08/20/2022) for follow up diabetes.   Crecencio Mc, MD

## 2022-05-20 NOTE — Assessment & Plan Note (Signed)
Advised to use tylenol 1000 mg daily and tramadol prn.  NSAID stopped

## 2022-05-22 MED ORDER — LOSARTAN POTASSIUM 50 MG PO TABS: 50.0000 mg | ORAL_TABLET | Freq: Every day | ORAL | 0 refills | Status: DC

## 2022-05-22 NOTE — Addendum Note (Signed)
Addended by: Crecencio Mc on: 05/22/2022 01:02 PM   Modules accepted: Orders

## 2022-05-25 ENCOUNTER — Other Ambulatory Visit: Payer: Self-pay | Admitting: Internal Medicine

## 2022-05-30 ENCOUNTER — Other Ambulatory Visit (INDEPENDENT_AMBULATORY_CARE_PROVIDER_SITE_OTHER): Payer: Medicare Other

## 2022-05-30 DIAGNOSIS — N289 Disorder of kidney and ureter, unspecified: Secondary | ICD-10-CM | POA: Diagnosis not present

## 2022-05-30 LAB — BASIC METABOLIC PANEL
BUN: 16 mg/dL (ref 6–23)
CO2: 30 mEq/L (ref 19–32)
Calcium: 9.7 mg/dL (ref 8.4–10.5)
Chloride: 101 mEq/L (ref 96–112)
Creatinine, Ser: 1.07 mg/dL (ref 0.40–1.20)
GFR: 53.24 mL/min — ABNORMAL LOW (ref >60.00)
Glucose, Bld: 116 mg/dL — ABNORMAL HIGH (ref 70–99)
Potassium: 4 mEq/L (ref 3.5–5.1)
Sodium: 139 mEq/L (ref 135–145)

## 2022-05-31 DIAGNOSIS — M5451 Vertebrogenic low back pain: Secondary | ICD-10-CM | POA: Diagnosis not present

## 2022-05-31 DIAGNOSIS — M542 Cervicalgia: Secondary | ICD-10-CM | POA: Diagnosis not present

## 2022-06-01 ENCOUNTER — Emergency Department (HOSPITAL_COMMUNITY)
Admission: EM | Admit: 2022-06-01 | Discharge: 2022-06-02 | Disposition: A | Discharge status: Home / Self Care | Payer: Medicare Other | Attending: Emergency Medicine | Admitting: Emergency Medicine

## 2022-06-01 ENCOUNTER — Other Ambulatory Visit: Payer: Self-pay

## 2022-06-01 ENCOUNTER — Encounter (HOSPITAL_COMMUNITY): Payer: Self-pay

## 2022-06-01 DIAGNOSIS — D649 Anemia, unspecified: Secondary | ICD-10-CM | POA: Diagnosis not present

## 2022-06-01 DIAGNOSIS — Z7984 Long term (current) use of oral hypoglycemic drugs: Secondary | ICD-10-CM | POA: Insufficient documentation

## 2022-06-01 DIAGNOSIS — E1122 Type 2 diabetes mellitus with diabetic chronic kidney disease: Secondary | ICD-10-CM | POA: Diagnosis not present

## 2022-06-01 DIAGNOSIS — M542 Cervicalgia: Secondary | ICD-10-CM | POA: Diagnosis not present

## 2022-06-01 DIAGNOSIS — Z79899 Other long term (current) drug therapy: Secondary | ICD-10-CM | POA: Insufficient documentation

## 2022-06-01 DIAGNOSIS — J9811 Atelectasis: Secondary | ICD-10-CM | POA: Diagnosis not present

## 2022-06-01 DIAGNOSIS — G8929 Other chronic pain: Secondary | ICD-10-CM | POA: Insufficient documentation

## 2022-06-01 DIAGNOSIS — I10 Essential (primary) hypertension: Secondary | ICD-10-CM | POA: Diagnosis not present

## 2022-06-01 DIAGNOSIS — D696 Thrombocytopenia, unspecified: Secondary | ICD-10-CM

## 2022-06-01 DIAGNOSIS — N289 Disorder of kidney and ureter, unspecified: Secondary | ICD-10-CM

## 2022-06-01 DIAGNOSIS — N189 Chronic kidney disease, unspecified: Secondary | ICD-10-CM | POA: Insufficient documentation

## 2022-06-01 DIAGNOSIS — M545 Low back pain, unspecified: Secondary | ICD-10-CM | POA: Diagnosis not present

## 2022-06-01 DIAGNOSIS — A419 Sepsis, unspecified organism: Secondary | ICD-10-CM | POA: Diagnosis not present

## 2022-06-01 DIAGNOSIS — I129 Hypertensive chronic kidney disease with stage 1 through stage 4 chronic kidney disease, or unspecified chronic kidney disease: Secondary | ICD-10-CM | POA: Insufficient documentation

## 2022-06-01 DIAGNOSIS — N390 Urinary tract infection, site not specified: Secondary | ICD-10-CM

## 2022-06-01 LAB — COMPREHENSIVE METABOLIC PANEL
ALT: 31 U/L (ref 0–44)
AST: 27 U/L (ref 15–41)
Albumin: 3.7 g/dL (ref 3.5–5.0)
Alkaline Phosphatase: 69 U/L (ref 38–126)
Anion gap: 14 (ref 5–15)
BUN: 18 mg/dL (ref 8–23)
CO2: 20 mmol/L — ABNORMAL LOW (ref 22–32)
Calcium: 9.1 mg/dL (ref 8.9–10.3)
Chloride: 99 mmol/L (ref 98–111)
Creatinine, Ser: 1.3 mg/dL — ABNORMAL HIGH (ref 0.44–1.00)
GFR, Estimated: 45 mL/min — ABNORMAL LOW (ref >60)
Glucose, Bld: 185 mg/dL — ABNORMAL HIGH (ref 70–99)
Potassium: 3.8 mmol/L (ref 3.5–5.1)
Sodium: 133 mmol/L — ABNORMAL LOW (ref 135–145)
Total Bilirubin: 1.1 mg/dL (ref 0.3–1.2)
Total Protein: 7.5 g/dL (ref 6.5–8.1)

## 2022-06-01 LAB — CBC WITH DIFFERENTIAL/PLATELET
Abs Immature Granulocytes: 0.05 10*3/uL (ref 0.00–0.07)
Basophils Absolute: 0.1 10*3/uL (ref 0.0–0.1)
Basophils Relative: 0 %
Eosinophils Absolute: 0 10*3/uL (ref 0.0–0.5)
Eosinophils Relative: 0 %
HCT: 33.8 % — ABNORMAL LOW (ref 36.0–46.0)
Hemoglobin: 11.2 g/dL — ABNORMAL LOW (ref 12.0–15.0)
Immature Granulocytes: 0 %
Lymphocytes Relative: 10 %
Lymphs Abs: 1.3 10*3/uL (ref 0.7–4.0)
MCH: 29 pg (ref 26.0–34.0)
MCHC: 33.1 g/dL (ref 30.0–36.0)
MCV: 87.6 fL (ref 80.0–100.0)
Monocytes Absolute: 1.5 10*3/uL — ABNORMAL HIGH (ref 0.1–1.0)
Monocytes Relative: 11 %
Neutro Abs: 10.8 10*3/uL — ABNORMAL HIGH (ref 1.7–7.7)
Neutrophils Relative %: 79 %
Platelets: 136 10*3/uL — ABNORMAL LOW (ref 150–400)
RBC: 3.86 MIL/uL — ABNORMAL LOW (ref 3.87–5.11)
RDW: 13.4 % (ref 11.5–15.5)
WBC: 13.7 10*3/uL — ABNORMAL HIGH (ref 4.0–10.5)
nRBC: 0 % (ref 0.0–0.2)

## 2022-06-01 NOTE — ED Triage Notes (Signed)
Presents to ER with daughter. C/o on going neck stiffness and lower back pain x 3 weeks.   Also admits tick bite on the head 3 weeks ago and a always has bad reactions to them.   Fever 101.2 at home pta.

## 2022-06-02 ENCOUNTER — Emergency Department (HOSPITAL_COMMUNITY): Payer: Medicare Other

## 2022-06-02 DIAGNOSIS — J9811 Atelectasis: Secondary | ICD-10-CM | POA: Diagnosis not present

## 2022-06-02 DIAGNOSIS — A419 Sepsis, unspecified organism: Secondary | ICD-10-CM | POA: Diagnosis not present

## 2022-06-02 DIAGNOSIS — M542 Cervicalgia: Secondary | ICD-10-CM | POA: Diagnosis not present

## 2022-06-02 LAB — URINALYSIS, ROUTINE W REFLEX MICROSCOPIC
Bilirubin Urine: NEGATIVE
Glucose, UA: NEGATIVE mg/dL
Hgb urine dipstick: NEGATIVE
Ketones, ur: 5 mg/dL — AB
Nitrite: NEGATIVE
Protein, ur: 30 mg/dL — AB
Specific Gravity, Urine: 1.019 (ref 1.005–1.030)
pH: 5 (ref 5.0–8.0)

## 2022-06-02 LAB — PROTIME-INR
INR: 1.1 (ref 0.8–1.2)
Prothrombin Time: 14.5 seconds (ref 11.4–15.2)

## 2022-06-02 LAB — APTT: aPTT: 37 seconds — ABNORMAL HIGH (ref 24–36)

## 2022-06-02 LAB — LACTIC ACID, PLASMA: Lactic Acid, Venous: 1.3 mmol/L (ref 0.5–1.9)

## 2022-06-02 MED ORDER — KETOROLAC TROMETHAMINE 30 MG/ML IJ SOLN: 30.0000 mg | Freq: Once | INTRAMUSCULAR | Status: DC

## 2022-06-02 MED ORDER — OXYCODONE HCL 5 MG PO TABS: 5.0000 mg | ORAL_TABLET | ORAL | 0 refills | Status: DC | PRN

## 2022-06-02 MED ORDER — METHOCARBAMOL 1000 MG/10ML IJ SOLN
1000.0000 mg | Freq: Once | INTRAVENOUS | Status: AC
  Administered 2022-06-02: 1000 mg via INTRAVENOUS
  Filled 2022-06-02: qty 10

## 2022-06-02 MED ORDER — LACTATED RINGERS IV BOLUS
1000.0000 mL | Freq: Once | INTRAVENOUS | Status: AC
  Administered 2022-06-02: 1000 mL via INTRAVENOUS

## 2022-06-02 MED ORDER — CEPHALEXIN 500 MG PO CAPS
500.0000 mg | ORAL_CAPSULE | Freq: Three times a day (TID) | ORAL | 0 refills | Status: DC
Start: 2022-06-02 — End: 2022-10-04

## 2022-06-02 MED ORDER — ACETAMINOPHEN 325 MG PO TABS
650.0000 mg | ORAL_TABLET | Freq: Once | ORAL | Status: AC
  Administered 2022-06-02: 650 mg via ORAL
  Filled 2022-06-02: qty 2

## 2022-06-02 MED ORDER — MORPHINE SULFATE (PF) 4 MG/ML IV SOLN
4.0000 mg | Freq: Once | INTRAVENOUS | Status: AC
  Administered 2022-06-02: 4 mg via INTRAVENOUS
  Filled 2022-06-02: qty 1

## 2022-06-02 MED ORDER — SODIUM CHLORIDE 0.9 % IV SOLN
2.0000 g | Freq: Once | INTRAVENOUS | Status: AC
  Administered 2022-06-02: 2 g via INTRAVENOUS
  Filled 2022-06-02: qty 20

## 2022-06-02 MED ORDER — TIZANIDINE HCL 4 MG PO TABS: 4.0000 mg | ORAL_TABLET | Freq: Four times a day (QID) | ORAL | 0 refills | Status: DC | PRN

## 2022-06-03 LAB — URINE CULTURE

## 2022-06-06 ENCOUNTER — Telehealth: Payer: Self-pay | Admitting: Internal Medicine

## 2022-06-06 NOTE — Telephone Encounter (Signed)
Pt called in requesting callback... Pt stated that she would like to spoke with CMA or Dr. Derrel Nip to give them an update about her ED visit that she had over this past weekend... Pt stated that she had a lot of blood work down at the ED... Pt requesting callback

## 2022-06-07 LAB — CULTURE, BLOOD (ROUTINE X 2)
Culture: NO GROWTH
Culture: NO GROWTH
Special Requests: ADEQUATE
Special Requests: ADEQUATE

## 2022-06-07 NOTE — Telephone Encounter (Signed)
Pt is aware of message below.

## 2022-06-07 NOTE — Telephone Encounter (Signed)
Spoke with pt and she stated that she would like to let you know that she was just recently in the ED for severe back and neck pain. Pt stated that the pain medication they had her on in the hospital and the pian medication that they sent her home with did not ease the pain. Pt stated she decided that she would not take the pain medication they prescribed her and try the diclofenac again. Pt stated that morning after she took the diclofenac she woke up with absolutely no pain. Pt stated that she knows she is not supposed to be taking it but she stated that it is the only medication that controls her pain.

## 2022-06-10 ENCOUNTER — Other Ambulatory Visit: Payer: Self-pay | Admitting: Internal Medicine

## 2022-06-28 ENCOUNTER — Other Ambulatory Visit: Payer: Self-pay | Admitting: Internal Medicine

## 2022-06-28 DIAGNOSIS — M5013 Cervical disc disorder with radiculopathy, cervicothoracic region: Secondary | ICD-10-CM | POA: Diagnosis not present

## 2022-06-28 DIAGNOSIS — M5412 Radiculopathy, cervical region: Secondary | ICD-10-CM | POA: Diagnosis not present

## 2022-07-18 DIAGNOSIS — D631 Anemia in chronic kidney disease: Secondary | ICD-10-CM | POA: Diagnosis not present

## 2022-07-18 DIAGNOSIS — E785 Hyperlipidemia, unspecified: Secondary | ICD-10-CM | POA: Diagnosis not present

## 2022-07-18 DIAGNOSIS — M5459 Other low back pain: Secondary | ICD-10-CM | POA: Diagnosis not present

## 2022-07-18 DIAGNOSIS — E1122 Type 2 diabetes mellitus with diabetic chronic kidney disease: Secondary | ICD-10-CM | POA: Diagnosis not present

## 2022-07-18 DIAGNOSIS — R8290 Unspecified abnormal findings in urine: Secondary | ICD-10-CM | POA: Diagnosis not present

## 2022-07-18 DIAGNOSIS — R809 Proteinuria, unspecified: Secondary | ICD-10-CM | POA: Diagnosis not present

## 2022-07-18 DIAGNOSIS — I10 Essential (primary) hypertension: Secondary | ICD-10-CM | POA: Diagnosis not present

## 2022-07-18 DIAGNOSIS — N1831 Chronic kidney disease, stage 3a: Secondary | ICD-10-CM | POA: Diagnosis not present

## 2022-07-19 ENCOUNTER — Other Ambulatory Visit: Payer: Self-pay | Admitting: Nephrology

## 2022-07-19 DIAGNOSIS — N1831 Chronic kidney disease, stage 3a: Secondary | ICD-10-CM

## 2022-07-19 DIAGNOSIS — R8290 Unspecified abnormal findings in urine: Secondary | ICD-10-CM

## 2022-07-19 DIAGNOSIS — R809 Proteinuria, unspecified: Secondary | ICD-10-CM

## 2022-07-19 DIAGNOSIS — E785 Hyperlipidemia, unspecified: Secondary | ICD-10-CM

## 2022-07-19 DIAGNOSIS — D631 Anemia in chronic kidney disease: Secondary | ICD-10-CM

## 2022-07-19 DIAGNOSIS — E1122 Type 2 diabetes mellitus with diabetic chronic kidney disease: Secondary | ICD-10-CM

## 2022-07-25 ENCOUNTER — Other Ambulatory Visit: Payer: Self-pay | Admitting: Internal Medicine

## 2022-07-29 ENCOUNTER — Ambulatory Visit
Admission: RE | Admit: 2022-07-29 | Discharge: 2022-07-29 | Disposition: A | Discharge status: Home / Self Care | Payer: Medicare Other | Source: Ambulatory Visit | Attending: Nephrology | Admitting: Nephrology

## 2022-07-29 DIAGNOSIS — E785 Hyperlipidemia, unspecified: Secondary | ICD-10-CM | POA: Diagnosis not present

## 2022-07-29 DIAGNOSIS — R188 Other ascites: Secondary | ICD-10-CM | POA: Diagnosis not present

## 2022-07-29 DIAGNOSIS — D631 Anemia in chronic kidney disease: Secondary | ICD-10-CM | POA: Insufficient documentation

## 2022-07-29 DIAGNOSIS — N189 Chronic kidney disease, unspecified: Secondary | ICD-10-CM | POA: Insufficient documentation

## 2022-07-29 DIAGNOSIS — R809 Proteinuria, unspecified: Secondary | ICD-10-CM | POA: Insufficient documentation

## 2022-07-29 DIAGNOSIS — N1831 Chronic kidney disease, stage 3a: Secondary | ICD-10-CM | POA: Diagnosis not present

## 2022-07-29 DIAGNOSIS — R8290 Unspecified abnormal findings in urine: Secondary | ICD-10-CM | POA: Diagnosis not present

## 2022-07-29 DIAGNOSIS — E1122 Type 2 diabetes mellitus with diabetic chronic kidney disease: Secondary | ICD-10-CM | POA: Insufficient documentation

## 2022-08-06 DIAGNOSIS — M5416 Radiculopathy, lumbar region: Secondary | ICD-10-CM | POA: Diagnosis not present

## 2022-08-08 ENCOUNTER — Other Ambulatory Visit: Payer: Self-pay | Admitting: Internal Medicine

## 2022-08-21 DIAGNOSIS — M5416 Radiculopathy, lumbar region: Secondary | ICD-10-CM | POA: Diagnosis not present

## 2022-08-22 DIAGNOSIS — I10 Essential (primary) hypertension: Secondary | ICD-10-CM | POA: Diagnosis not present

## 2022-08-22 DIAGNOSIS — G4700 Insomnia, unspecified: Secondary | ICD-10-CM | POA: Diagnosis not present

## 2022-08-22 DIAGNOSIS — Z86010 Personal history of colonic polyps: Secondary | ICD-10-CM | POA: Diagnosis not present

## 2022-08-22 DIAGNOSIS — R194 Change in bowel habit: Secondary | ICD-10-CM | POA: Diagnosis not present

## 2022-08-22 DIAGNOSIS — E782 Mixed hyperlipidemia: Secondary | ICD-10-CM | POA: Diagnosis not present

## 2022-08-22 DIAGNOSIS — Z1211 Encounter for screening for malignant neoplasm of colon: Secondary | ICD-10-CM | POA: Diagnosis not present

## 2022-08-22 DIAGNOSIS — K219 Gastro-esophageal reflux disease without esophagitis: Secondary | ICD-10-CM | POA: Diagnosis not present

## 2022-08-26 DIAGNOSIS — M19072 Primary osteoarthritis, left ankle and foot: Secondary | ICD-10-CM | POA: Diagnosis not present

## 2022-08-26 DIAGNOSIS — M25572 Pain in left ankle and joints of left foot: Secondary | ICD-10-CM | POA: Diagnosis not present

## 2022-08-27 ENCOUNTER — Other Ambulatory Visit: Payer: Self-pay | Admitting: Family

## 2022-08-27 DIAGNOSIS — M5416 Radiculopathy, lumbar region: Secondary | ICD-10-CM | POA: Diagnosis not present

## 2022-09-02 DIAGNOSIS — Z86010 Personal history of colonic polyps: Secondary | ICD-10-CM | POA: Diagnosis not present

## 2022-09-02 LAB — HM COLONOSCOPY

## 2022-09-04 ENCOUNTER — Encounter: Payer: Self-pay | Admitting: Internal Medicine

## 2022-09-04 DIAGNOSIS — D126 Benign neoplasm of colon, unspecified: Secondary | ICD-10-CM

## 2022-09-04 NOTE — Assessment & Plan Note (Signed)
NO POLYPSON SEePT 2023 COLONOSCOPY. BY JYOTHI MANN    FOLLOW UP FIVE YEARS.

## 2022-09-10 DIAGNOSIS — L814 Other melanin hyperpigmentation: Secondary | ICD-10-CM | POA: Diagnosis not present

## 2022-09-10 DIAGNOSIS — D2239 Melanocytic nevi of other parts of face: Secondary | ICD-10-CM | POA: Diagnosis not present

## 2022-09-10 DIAGNOSIS — D1801 Hemangioma of skin and subcutaneous tissue: Secondary | ICD-10-CM | POA: Diagnosis not present

## 2022-09-10 DIAGNOSIS — L821 Other seborrheic keratosis: Secondary | ICD-10-CM | POA: Diagnosis not present

## 2022-09-10 DIAGNOSIS — L918 Other hypertrophic disorders of the skin: Secondary | ICD-10-CM | POA: Diagnosis not present

## 2022-09-10 DIAGNOSIS — Z85828 Personal history of other malignant neoplasm of skin: Secondary | ICD-10-CM | POA: Diagnosis not present

## 2022-09-10 DIAGNOSIS — D2272 Melanocytic nevi of left lower limb, including hip: Secondary | ICD-10-CM | POA: Diagnosis not present

## 2022-09-10 DIAGNOSIS — D485 Neoplasm of uncertain behavior of skin: Secondary | ICD-10-CM | POA: Diagnosis not present

## 2022-09-23 DIAGNOSIS — D631 Anemia in chronic kidney disease: Secondary | ICD-10-CM | POA: Diagnosis not present

## 2022-09-23 DIAGNOSIS — N1831 Chronic kidney disease, stage 3a: Secondary | ICD-10-CM | POA: Diagnosis not present

## 2022-09-23 DIAGNOSIS — R809 Proteinuria, unspecified: Secondary | ICD-10-CM | POA: Diagnosis not present

## 2022-09-23 DIAGNOSIS — E785 Hyperlipidemia, unspecified: Secondary | ICD-10-CM | POA: Diagnosis not present

## 2022-09-23 DIAGNOSIS — M5459 Other low back pain: Secondary | ICD-10-CM | POA: Diagnosis not present

## 2022-09-23 DIAGNOSIS — R8290 Unspecified abnormal findings in urine: Secondary | ICD-10-CM | POA: Diagnosis not present

## 2022-09-23 DIAGNOSIS — E1122 Type 2 diabetes mellitus with diabetic chronic kidney disease: Secondary | ICD-10-CM | POA: Diagnosis not present

## 2022-09-23 DIAGNOSIS — I10 Essential (primary) hypertension: Secondary | ICD-10-CM | POA: Diagnosis not present

## 2022-09-26 DIAGNOSIS — I10 Essential (primary) hypertension: Secondary | ICD-10-CM | POA: Diagnosis not present

## 2022-09-26 DIAGNOSIS — D631 Anemia in chronic kidney disease: Secondary | ICD-10-CM | POA: Diagnosis not present

## 2022-09-26 DIAGNOSIS — R809 Proteinuria, unspecified: Secondary | ICD-10-CM | POA: Diagnosis not present

## 2022-09-26 DIAGNOSIS — E1122 Type 2 diabetes mellitus with diabetic chronic kidney disease: Secondary | ICD-10-CM | POA: Diagnosis not present

## 2022-09-26 DIAGNOSIS — R8290 Unspecified abnormal findings in urine: Secondary | ICD-10-CM | POA: Diagnosis not present

## 2022-09-26 DIAGNOSIS — N1831 Chronic kidney disease, stage 3a: Secondary | ICD-10-CM | POA: Diagnosis not present

## 2022-09-26 DIAGNOSIS — M5459 Other low back pain: Secondary | ICD-10-CM | POA: Diagnosis not present

## 2022-09-26 DIAGNOSIS — E785 Hyperlipidemia, unspecified: Secondary | ICD-10-CM | POA: Diagnosis not present

## 2022-10-01 DIAGNOSIS — B351 Tinea unguium: Secondary | ICD-10-CM | POA: Diagnosis not present

## 2022-10-01 DIAGNOSIS — Z85828 Personal history of other malignant neoplasm of skin: Secondary | ICD-10-CM | POA: Diagnosis not present

## 2022-10-01 DIAGNOSIS — L905 Scar conditions and fibrosis of skin: Secondary | ICD-10-CM | POA: Diagnosis not present

## 2022-10-01 DIAGNOSIS — D485 Neoplasm of uncertain behavior of skin: Secondary | ICD-10-CM | POA: Diagnosis not present

## 2022-10-03 ENCOUNTER — Encounter: Payer: Self-pay | Admitting: Internal Medicine

## 2022-10-04 ENCOUNTER — Ambulatory Visit (INDEPENDENT_AMBULATORY_CARE_PROVIDER_SITE_OTHER): Payer: Medicare Other | Admitting: Internal Medicine

## 2022-10-04 ENCOUNTER — Encounter: Payer: Self-pay | Admitting: Internal Medicine

## 2022-10-04 VITALS — BP 118/62 | HR 55 | Temp 97.6°F | Ht 61.0 in | Wt 145.0 lb

## 2022-10-04 DIAGNOSIS — E669 Obesity, unspecified: Secondary | ICD-10-CM | POA: Diagnosis not present

## 2022-10-04 DIAGNOSIS — E041 Nontoxic single thyroid nodule: Secondary | ICD-10-CM | POA: Diagnosis not present

## 2022-10-04 DIAGNOSIS — E871 Hypo-osmolality and hyponatremia: Secondary | ICD-10-CM

## 2022-10-04 DIAGNOSIS — K760 Fatty (change of) liver, not elsewhere classified: Secondary | ICD-10-CM

## 2022-10-04 DIAGNOSIS — D696 Thrombocytopenia, unspecified: Secondary | ICD-10-CM | POA: Diagnosis not present

## 2022-10-04 DIAGNOSIS — I10 Essential (primary) hypertension: Secondary | ICD-10-CM | POA: Diagnosis not present

## 2022-10-04 DIAGNOSIS — M5126 Other intervertebral disc displacement, lumbar region: Secondary | ICD-10-CM | POA: Diagnosis not present

## 2022-10-04 DIAGNOSIS — E785 Hyperlipidemia, unspecified: Secondary | ICD-10-CM

## 2022-10-04 DIAGNOSIS — E1169 Type 2 diabetes mellitus with other specified complication: Secondary | ICD-10-CM

## 2022-10-04 LAB — LIPID PANEL
Cholesterol: 148 mg/dL (ref 0–200)
HDL: 42.8 mg/dL (ref >39.00)
NonHDL: 104.79
Total CHOL/HDL Ratio: 3
Triglycerides: 221 mg/dL — ABNORMAL HIGH (ref 0.0–149.0)
VLDL: 44.2 mg/dL — ABNORMAL HIGH (ref 0.0–40.0)

## 2022-10-04 LAB — CBC WITH DIFFERENTIAL/PLATELET
Basophils Absolute: 0.1 10*3/uL (ref 0.0–0.1)
Basophils Relative: 1 % (ref 0.0–3.0)
Eosinophils Absolute: 0.2 10*3/uL (ref 0.0–0.7)
Eosinophils Relative: 3.5 % (ref 0.0–5.0)
HCT: 36.2 % (ref 36.0–46.0)
Hemoglobin: 12 g/dL (ref 12.0–15.0)
Lymphocytes Relative: 31.6 % (ref 12.0–46.0)
Lymphs Abs: 2 10*3/uL (ref 0.7–4.0)
MCHC: 33.1 g/dL (ref 30.0–36.0)
MCV: 85.2 fl (ref 78.0–100.0)
Monocytes Absolute: 0.5 10*3/uL (ref 0.1–1.0)
Monocytes Relative: 8.5 % (ref 3.0–12.0)
Neutro Abs: 3.5 10*3/uL (ref 1.4–7.7)
Neutrophils Relative %: 55.4 % (ref 43.0–77.0)
Platelets: 117 10*3/uL — ABNORMAL LOW (ref 150.0–400.0)
RBC: 4.25 Mil/uL (ref 3.87–5.11)
RDW: 15 % (ref 11.5–15.5)
WBC: 6.4 10*3/uL (ref 4.0–10.5)

## 2022-10-04 LAB — RENAL FUNCTION PANEL
Albumin: 4.4 g/dL (ref 3.5–5.2)
BUN: 15 mg/dL (ref 6–23)
CO2: 28 mEq/L (ref 19–32)
Calcium: 9.9 mg/dL (ref 8.4–10.5)
Chloride: 102 mEq/L (ref 96–112)
Creatinine, Ser: 0.9 mg/dL (ref 0.40–1.20)
GFR: 65.37 mL/min (ref >60.00)
Glucose, Bld: 100 mg/dL — ABNORMAL HIGH (ref 70–99)
Phosphorus: 4.9 mg/dL — ABNORMAL HIGH (ref 2.3–4.6)
Potassium: 4 mEq/L (ref 3.5–5.1)
Sodium: 139 mEq/L (ref 135–145)

## 2022-10-04 LAB — HEPATIC FUNCTION PANEL
ALT: 26 U/L (ref 0–35)
AST: 24 U/L (ref 0–37)
Albumin: 4.4 g/dL (ref 3.5–5.2)
Alkaline Phosphatase: 83 U/L (ref 39–117)
Bilirubin, Direct: 0.1 mg/dL (ref 0.0–0.3)
Total Bilirubin: 0.7 mg/dL (ref 0.2–1.2)
Total Protein: 7 g/dL (ref 6.0–8.3)

## 2022-10-04 LAB — HEMOGLOBIN A1C: Hgb A1c MFr Bld: 7.2 % — ABNORMAL HIGH (ref 4.6–6.5)

## 2022-10-04 LAB — TSH: TSH: 1.86 u[IU]/mL (ref 0.35–5.50)

## 2022-10-04 LAB — LDL CHOLESTEROL, DIRECT: Direct LDL: 80 mg/dL

## 2022-10-04 MED ORDER — PREDNISONE 10 MG PO TABS: ORAL_TABLET | ORAL | 0 refills | Status: DC

## 2022-10-04 MED ORDER — TIZANIDINE HCL 4 MG PO TABS: 4.0000 mg | ORAL_TABLET | Freq: Four times a day (QID) | ORAL | 1 refills | Status: DC | PRN

## 2022-10-04 NOTE — Patient Instructions (Signed)
You have injured your  back and you are in spasm.   PLEASE TAKE THE FOLLOWING   Tizanidine every 4 to 6 hours  Tylelnol 1000 mg every 12 hours  Prednisone taper  for 6 days  Tramadol IF NEEDED   PLEASE REFRAIN FROM :  LIFTING MORE THAN 5 LBS PUTTING ANYTHING ON YOUR SHOULDER VACUUMING  CALL [facility] ABOUT THE AQUATIC THERAPY    OK TO SUSPEND CRESTOR FOR 3 MONTHS AND RETURN FOR LABS AFTER 3 MONTHS

## 2022-10-04 NOTE — Progress Notes (Unsigned)
Subjective:  Patient ID: Sara Mills, female    DOB: [birth year]  Age: 71 y.o. MRN: [id number]  CC: The primary encounter diagnosis was Essential hypertension. Diagnoses of THYROID NODULE, Hyperlipidemia due to type 2 diabetes mellitus ([place]), Diabetes mellitus type 2 in obese [facility]), Hepatic steatosis, and Hyponatremia were also pertinent to this visit.   HPI Sara Mills presents for  follow up on type  2 DM, hypertension  Chief Complaint  Patient presents with   Follow-up    6 month follow up     1) new onset thoracic back pain radiating to umbilicis for the past 3 days since helping husband push /lift a riding law mower up into a truck .too uncomfortable to sleep without tramadol which she has not used since her last visit .  Has been using only tylenol ,  heat and ice Her T10 level is in spasm  He has n 2) chronic L2-3 radiculopathy. has had 3 ESIs  since June 1 in  cervical spine,  2 in lumbar spine  [facility].  None have worked.  She has not returned. Aquatic therapy .  Last MRI was done by [facility] 2 years ago.   3) wants to stop Crestor because a nurse at the hospital told her that it was causing her diabetes   Outpatient Medications Prior to Visit  Medication Sig Dispense Refill   glipiZIDE (GLUCOTROL) 5 MG tablet TAKE 1/2 TABLET (2.5 MG TOTAL) BY MOUTH TWICE DAILY BEFORE A MEAL 90 tablet 1   losartan (COZAAR) 50 MG tablet TAKE 1 TABLET BY MOUTH EVERY DAY 90 tablet 0   metFORMIN (GLUCOPHAGE) 1000 MG tablet TAKE 1 TABLET (1,000 MG TOTAL) BY MOUTH TWICE A DAY WITH FOOD 180 tablet 1   rosuvastatin (CRESTOR) 20 MG tablet TAKE 1 TABLET BY MOUTH EVERY DAY (Patient taking differently: Take 20 mg by mouth daily.) 90 tablet 1   Accu-Chek FastClix Lancets MISC USE 1 LANCET TO CHECK GLUCOSE TWICE DAILY (Patient not taking: Reported on 10/04/2022) 102 each 1   gabapentin (NEURONTIN) 300 MG capsule Take 300 mg by mouth 2 (two) times daily. (Patient not taking: Reported on  10/04/2022)     glucose blood (ACCU-CHEK GUIDE) test strip USE 1 STRIP TO CHECK BLOOD GLUCOSE ONCE A DAY (Patient not taking: Reported on 10/04/2022) 100 strip 1   Polyethyl Glycol-Propyl Glycol (SYSTANE OP) Place 1 drop into both eyes 2 (two) times daily as needed (dry eyes). (Patient not taking: Reported on 10/04/2022)     tiZANidine (ZANAFLEX) 4 MG tablet Take 1 tablet (4 mg total) by mouth every 6 (six) hours as needed for muscle spasms. (Patient not taking: Reported on 10/04/2022) 30 tablet 0   traMADol (ULTRAM) 50 MG tablet Take 1 tablet (50 mg total) by mouth 2 (two) times daily. (Patient not taking: Reported on 10/04/2022) 60 tablet 5   cephALEXin (KEFLEX) 500 MG capsule Take 1 capsule (500 mg total) by mouth 3 (three) times daily. 30 capsule 0   losartan-hydrochlorothiazide (HYZAAR) 50-12.5 MG tablet TAKE 1 TABLET BY MOUTH EVERY DAY (Patient not taking: Reported on 06/02/2022) 90 tablet 1   omeprazole (PRILOSEC) 40 MG capsule TAKE 1 CAPSULE (40 MG TOTAL) BY MOUTH DAILY. 90 capsule 1   oxyCODONE (ROXICODONE) 5 MG immediate release tablet Take 1 tablet (5 mg total) by mouth every 4 (four) hours as needed for severe pain. 15 tablet 0   valACYclovir (VALTREX) 500 MG tablet Take 1 tablet (500 mg total)  by mouth 3 (three) times daily. (Patient not taking: Reported on 06/02/2022) 21 tablet 2   zolpidem (AMBIEN) 10 MG tablet TAKE 1 TABLET (10 MG TOTAL) BY MOUTH AT BEDTIME AS NEEDED. FOR SLEEP 30 tablet 0   No facility-administered medications prior to visit.    Review of Systems;  Patient denies headache, fevers, malaise, unintentional weight loss, skin rash, eye pain, sinus congestion and sinus pain, sore throat, dysphagia,  hemoptysis , cough, dyspnea, wheezing, chest pain, palpitations, orthopnea, edema, abdominal pain, nausea, melena, diarrhea, constipation, flank pain, dysuria, hematuria, urinary  Frequency, nocturia, numbness, tingling, seizures,  Focal weakness, Loss of consciousness,  Tremor,  insomnia, depression, anxiety, and suicidal ideation.      Objective:  BP 136/82 (BP Location: Left Arm, Patient Position: Sitting, Cuff Size: Normal)   Pulse (!) 55   Temp 97.6 F (36.4 C) (Oral)   Ht '5\' 1"'$  (1.549 m)   Wt 145 lb (65.8 kg)   SpO2 97%   BMI 27.40 kg/m   BP Readings from Last 3 Encounters:  10/04/22 136/82  06/02/22 113/62  05/20/22 134/76    Wt Readings from Last 3 Encounters:  10/04/22 145 lb (65.8 kg)  06/01/22 150 lb (68 kg)  05/20/22 148 lb (67.1 kg)    General appearance: alert, cooperative and appears stated age Ears: normal TM's and external ear canals both ears Throat: lips, mucosa, and tongue normal; teeth and gums normal Neck: no adenopathy, no carotid bruit, supple, symmetrical, trachea midline and thyroid not enlarged, symmetric, no tenderness/mass/nodules Back: symmetric, no curvature. ROM normal. No CVA tenderness. Lungs: clear to auscultation bilaterally Heart: regular rate and rhythm, S1, S2 normal, no murmur, click, rub or gallop Abdomen: soft, non-tender; bowel sounds normal; no masses,  no organomegaly Pulses: 2+ and symmetric Skin: Skin color, texture, turgor normal. No rashes or lesions Lymph nodes: Cervical, supraclavicular, and axillary nodes normal. Neuro:  awake and interactive with normal mood and affect. Higher cortical functions are normal. Speech is clear without word-finding difficulty or dysarthria. Extraocular movements are intact. Visual fields of both eyes are grossly intact. Sensation to light touch is grossly intact bilaterally of upper and lower extremities. Motor examination shows 4+/5 symmetric hand grip and upper extremity and 5/5 lower extremity strength. There is no pronation or drift. Gait is non-ataxic   Lab Results  Component Value Date   HGBA1C 7.2 (H) 04/04/2022   HGBA1C 6.9 (H) 10/04/2021   HGBA1C 7.4 (H) 04/04/2021    Lab Results  Component Value Date   CREATININE 1.30 (H) 06/01/2022   CREATININE 1.07  05/30/2022   CREATININE 1.05 05/20/2022    Lab Results  Component Value Date   WBC 13.7 (H) 06/01/2022   HGB 11.2 (L) 06/01/2022   HCT 33.8 (L) 06/01/2022   PLT 136 (L) 06/01/2022   GLUCOSE 185 (H) 06/01/2022   CHOL 151 04/04/2022   TRIG 201.0 (H) 04/04/2022   HDL 41.20 04/04/2022   LDLDIRECT 85.0 04/04/2022   LDLCALC 72 05/23/2020   ALT 31 06/01/2022   AST 27 06/01/2022   NA 133 (L) 06/01/2022   K 3.8 06/01/2022   CL 99 06/01/2022   CREATININE 1.30 (H) 06/01/2022   BUN 18 06/01/2022   CO2 20 (L) 06/01/2022   TSH 1.47 04/04/2021   INR 1.1 06/02/2022   HGBA1C 7.2 (H) 04/04/2022   MICROALBUR 3.4 (H) 04/04/2022    US RENAL  Result Date: 07/30/2022 CLINICAL DATA:  Chronic renal disease EXAM: RENAL / URINARY TRACT ULTRASOUND COMPLETE  COMPARISON:  None Available. FINDINGS: Right Kidney: Renal measurements: 9.6 x 5.3 x 5.0 cm = volume: 134 mL. The right renal pelvis is fluid-filled and mildly prominent. No caliectasis. Left Kidney: Renal measurements: 9.5 x 4.6 x 4.2 cm = volume: 96 mL. Echogenicity within normal limits. No mass or hydronephrosis visualized. Bladder: Appears normal for degree of bladder distention. Other: None. IMPRESSION: 1. The right renal pelvis is fluid-filled and mildly prominent without caliectasis. The kidneys and bladder are otherwise normal. Electronically Signed   By: Dorise Bullion III M.D.   On: 07/30/2022 11:54    Assessment & Plan:   Problem List Items Addressed This Visit     THYROID NODULE   Relevant Orders   TSH   Hyperlipidemia due to type 2 diabetes mellitus ([place])   Relevant Orders   Lipid panel   LDL cholesterol, direct   Essential hypertension - Primary   Diabetes mellitus type 2 in obese ([facility])   Relevant Orders   Hemoglobin A1c   Hepatic steatosis   Relevant Orders   Hepatic function panel   Other Visit Diagnoses     Hyponatremia       Relevant Orders   Renal function panel       I spent a total of   minutes with this  patient in a face to face visit on the date of this encounter reviewing the last office visit with me in       ,  most recent visit with cardiology ,    ,  patient's diet and exercise habits, home blood pressure /blod sugar readings, recent ER visit including labs and imaging studies ,   and post visit ordering of testing and therapeutics.    Follow-up: No follow-ups on file.   Crecencio Mc, MD

## 2022-10-06 NOTE — Assessment & Plan Note (Addendum)
Taking glipzide and metformin regularly,  Losartan .   Lab Results  Component Value Date   HGBA1C 7.2 (H) 10/04/2022   Lab Results  Component Value Date   MICROALBUR 3.4 (H) 04/04/2022   MICROALBUR 2.3 (H) 04/04/2021

## 2022-10-06 NOTE — Assessment & Plan Note (Signed)
She has requested a suspension of statin due to concern for its effect on her glycemic control. 3 month suspension planned.  LDL and a1c are above goal  Lab Results  Component Value Date   CHOL 148 10/04/2022   HDL 42.80 10/04/2022   LDLCALC 72 05/23/2020   LDLDIRECT 80.0 10/04/2022   TRIG 221.0 (H) 10/04/2022   CHOLHDL 3 10/04/2022   Lab Results  Component Value Date   HGBA1C 7.2 (H) 10/04/2022

## 2022-10-06 NOTE — Assessment & Plan Note (Signed)
Current flare aggravated by lifting heavy objects. Activity modification counselled and prednisone taper sent

## 2022-10-14 ENCOUNTER — Other Ambulatory Visit: Payer: Self-pay | Admitting: Internal Medicine

## 2022-10-20 DIAGNOSIS — Z23 Encounter for immunization: Secondary | ICD-10-CM | POA: Diagnosis not present

## 2022-11-05 ENCOUNTER — Other Ambulatory Visit: Payer: Self-pay | Admitting: Family

## 2022-11-17 ENCOUNTER — Other Ambulatory Visit: Payer: Self-pay | Admitting: Internal Medicine

## 2022-11-18 MED ORDER — LOSARTAN POTASSIUM 50 MG PO TABS: 50.0000 mg | ORAL_TABLET | Freq: Every day | ORAL | 1 refills | Status: DC

## 2022-11-19 NOTE — Progress Notes (Signed)
This encounter was created in error - please disregard.

## 2022-11-29 ENCOUNTER — Ambulatory Visit (INDEPENDENT_AMBULATORY_CARE_PROVIDER_SITE_OTHER): Payer: Medicare Other

## 2022-11-29 VITALS — Ht 61.0 in | Wt 145.0 lb

## 2022-11-29 DIAGNOSIS — Z0000 Encounter for general adult medical examination without abnormal findings: Secondary | ICD-10-CM | POA: Diagnosis not present

## 2022-11-29 DIAGNOSIS — Z1231 Encounter for screening mammogram for malignant neoplasm of breast: Secondary | ICD-10-CM

## 2022-11-29 NOTE — Progress Notes (Signed)
Subjective:   Sara Mills is a 71 y.o. female who presents for Medicare Annual (Subsequent) preventive examination.  Review of Systems    No ROS.  Medicare Wellness Virtual Visit.  Visual/audio telehealth visit, UTA vital signs.   See social history for additional risk factors.   Cardiac Risk Factors include: advanced age (>22mn, >>9women)     Objective:    Today's Vitals   11/29/22 0852  Weight: 145 lb (65.8 kg)  Height: _0  (1.549 m)   Body mass index is 27.4 kg/m.     11/29/2022    9:05 AM 06/01/2022    7:33 PM 11/27/2021   11:28 AM 11/24/2020   11:28 AM 11/24/2019   11:11 AM  Advanced Directives  Does Patient Have a Medical Advance Directive? _1   Would patient like information on creating a medical advance directive? No - Patient declined No - Patient declined No - Patient declined  Yes (MAU/Ambulatory/Procedural Areas - Information given)    Current Medications (verified) Outpatient Encounter Medications as of 11/29/2022  Medication Sig   Accu-Chek FastClix Lancets MISC USE 1 LANCET TO CHECK GLUCOSE TWICE DAILY (Patient not taking: Reported on 10/04/2022)   ACCU-CHEK GUIDE test strip USE 1 STRIP TO CHECK BLOOD GLUCOSE ONCE A DAY   gabapentin (NEURONTIN) 300 MG capsule Take 300 mg by mouth 2 (two) times daily. (Patient not taking: Reported on 10/04/2022)   glipiZIDE (GLUCOTROL) 5 MG tablet TAKE 1/2 TABLET (2.5 MG TOTAL) BY MOUTH TWICE DAILY BEFORE A MEAL   losartan (COZAAR) 50 MG tablet Take 1 tablet (50 mg total) by mouth daily.   metFORMIN (GLUCOPHAGE) 1000 MG tablet TAKE 1 TABLET (1,000 MG TOTAL) BY MOUTH TWICE A DAY WITH FOOD   Polyethyl Glycol-Propyl Glycol (SYSTANE OP) Place 1 drop into both eyes 2 (two) times daily as needed (dry eyes). (Patient not taking: Reported on 10/04/2022)   predniSONE (DELTASONE) 10 MG tablet 6 tablets on Day 1 , then reduce by 1 tablet daily until gone   tiZANidine (ZANAFLEX) 4 MG tablet Take 1 tablet (4 mg  total) by mouth every 6 (six) hours as needed for muscle spasms.   traMADol (ULTRAM) 50 MG tablet Take 1 tablet (50 mg total) by mouth 2 (two) times daily. (Patient not taking: Reported on 10/04/2022)   No facility-administered encounter medications on file as of 11/29/2022.    Allergies (verified) Bee venom and Lisinopril   History: Past Medical History:  Diagnosis Date   Angioedema 06/08/2017   Diabetes mellitus    GERD (gastroesophageal reflux disease)    Hyperlipidemia    Hypertension    Past Surgical History:  Procedure Laterality Date   ABDOMINAL HYSTERECTOMY  1995   and BSO   APPENDECTOMY     BILATERAL OOPHORECTOMY     BREAST BIOPSY Right 02/15/2021   right breast calcs, x marker, BENIGN BREAST TISSUE WITH FIBROADENOMATOID CHANGES ANDASSOCIATED CALCIFICATIONS. - NEGATIVE FOR ATYPIA AND MALIGNANCY   BUNIONECTOMY     CARPAL TUNNEL RELEASE Right    CESAREAN SECTION  1977 and 1978   Family History  Problem Relation Age of Onset   Stroke Mother    Heart murmur Mother    Mitral valve prolapse Mother    Cancer Father [id number]      carcinoid syndrome   Stomach cancer Father    Breast cancer Neg Hx    Social History   Socioeconomic History   Marital status: Married    Spouse  name: Not on file   Number of children: Not on file   Years of education: Not on file   Highest education level: Not on file  Occupational History   Not on file  Tobacco Use   Smoking status: Never   Smokeless tobacco: Never  Vaping Use   Vaping Use: Never used  Substance and Sexual Activity   Alcohol use: No   Drug use: No   Sexual activity: Not on file  Other Topics Concern   Not on file  Social History Narrative   Not on file   Social Determinants of Health   Financial Resource Strain: Low Risk  (11/29/2022)   Overall Financial Resource Strain (CARDIA)    Difficulty of Paying Living Expenses: Not hard at all  Food Insecurity: No Food Insecurity (11/29/2022)   Hunger Vital Sign     Worried About Running Out of Food in the Last Year: Never true    [place] in the Last Year: Never true  Transportation Needs: No Transportation Needs (11/29/2022)   PRAPARE - Hydrologist (Medical): No    Lack of Transportation (Non-Medical): No  Physical Activity: Inactive (11/29/2022)   Exercise Vital Sign    Days of Exercise per Week: 0 days    Minutes of Exercise per Session: 0 min  Stress: No Stress Concern Present (11/29/2022)   [place]    Feeling of Stress : Only a little  Social Connections: Unknown (11/29/2022)   Social Connection and Isolation Panel [NHANES]    Frequency of Communication with Friends and Family: Three times a week    Frequency of Social Gatherings with Friends and Family: Once a week    Attends Religious Services: Not on Advertising copywriter or Organizations: No    Attends Archivist Meetings: Never    Marital Status: Married    Tobacco Counseling Counseling given: Not Answered   Clinical Intake:           Diabetes: Yes (Followed by PCP)  How often do you need to have someone help you when you read instructions, pamphlets, or other written materials from your doctor or pharmacy?: 1 - Never  Nutrition Risk Assessment: Has the patient had any N/V/D within the last 2 months?  No  Does the patient have any non-healing wounds?  No  Has the patient had any unintentional weight loss or weight gain?  No   Diabetes: Is the patient diabetic?  Yes  If diabetic, was a CBG obtained today?  Yes , FBS 100 Did the patient bring in their glucometer from home?  No  How often do you monitor your CBG's? 1-2x daily.   Financial Strains and Diabetes Management: Are you having any financial strains with the device, your supplies or your medication? No .  Does the patient want to be seen by Chronic Care Management for management of their  diabetes?  No  Would the patient like to be referred to a Nutritionist or for Diabetic Management?  No      Interpreter Needed?: No      Activities of Daily Living    11/29/2022    8:55 AM 11/25/2022    1:09 PM  In your present state of health, do you have any difficulty performing the following activities:  Hearing? 0 0  Vision? 0 1  Difficulty concentrating or making decisions? 0 1  Walking  or climbing stairs? 1 0  Comment Paces self when walking and climbing stairs   Dressing or bathing? 0 0  Doing errands, shopping? 0 0  Preparing Food and eating ? N N  Using the Toilet? N N  In the past six months, have you accidently leaked urine? Y Y  Comment stress incontinence   Do you have problems with loss of bowel control? N Y  Managing your Medications? N N  Managing your Finances? N N  Housekeeping or managing your Housekeeping? N N    Patient Care Team: Crecencio Mc, MD as PCP - General (Internal Medicine)  Indicate any recent Medical Services you may have received from other than [facility] providers in the past year (date may be approximate).     Assessment:   This is a routine wellness examination for Noelene.  I connected with  Rosana Berger on 11/29/22 by a audio enabled telemedicine application and verified that I am speaking with the correct person using two identifiers.  Patient Location: Home  Provider Location: Office/Clinic  I discussed the limitations of evaluation and management by telemedicine. The patient expressed understanding and agreed to proceed.   Hearing/Vision screen Hearing Screening - Comments:: Patient is able to hear conversational tones without difficulty. No issues reported. Vision Screening - Comments:: Followed by [facility], [place] Wears corrective lenses No retinopathy reported They have seen their ophthalmologist in the last 12 months.    Dietary issues and exercise activities discussed: Current Exercise Habits: Home  exercise routine, Intensity: Mild Regular diet Good water intake    Goals Addressed               This Visit's Progress     Patient Stated     Increase physical activity (pt-stated)        Start water aerobic/therapy       Depression Screen    11/29/2022    9:06 AM 10/04/2022    9:42 AM 04/04/2022    8:36 AM 11/27/2021   11:27 AM 10/04/2021    8:18 AM 04/04/2021   10:10 AM 11/24/2020   11:18 AM  PHQ 2/9 Scores  PHQ - 2 Score 0 0 0 0 0 0 0  PHQ- 9 Score   0   7     Fall Risk    11/29/2022    9:03 AM 11/25/2022    1:09 PM 10/04/2022    9:42 AM 04/04/2022    8:36 AM 11/27/2021   11:29 AM  Fall Risk   Falls in the past year? 0 0 0 0 0  Number falls in past yr:  0 0    Injury with Fall?  0 0 0   Risk for fall due to :   No Fall Risks No Fall Risks   Follow up Falls evaluation completed  Falls evaluation completed Falls evaluation completed Falls evaluation completed    [place]: Home free of loose throw rugs in walkways, pet beds, electrical cords, etc? Yes  Adequate lighting in your home to reduce risk of falls? Yes   ASSISTIVE DEVICES UTILIZED TO PREVENT FALLS: Life alert? No  Use of a cane, walker or w/c? No  Grab bars in the bathroom? Yes  Shower chair or bench in shower? Yes  Elevated toilet seat or a handicapped toilet? No   TIMED UP AND GO: Was the test performed? No .   Cognitive Function:  11/29/2022    9:06 AM 11/24/2020   11:29 AM 11/24/2019   11:16 AM  6CIT Screen  What Year? 0 points 0 points 0 points  What month? 0 points 0 points 0 points  What time? 0 points 0 points 0 points  Count back from 20 0 points 0 points 0 points  Months in reverse 0 points 0 points 0 points  Repeat phrase 0 points 0 points 0 points  Total Score 0 points 0 points 0 points    Immunizations Immunization History  Administered Date(s) Administered   Fluad Quad(high Dose 65+) 09/03/2019, 10/02/2020   Hep A / Hep  B 07/13/2019, 09/06/2019, 02/11/2020   Influenza Split 10/20/2013   Influenza, High Dose Seasonal PF 09/28/2018, 09/21/2021   Influenza,inj,Quad PF,6+ Mos 08/12/2014, 09/21/2016, 09/08/2017   Influenza-Unspecified 09/08/2012, 09/22/2015   PFIZER(Purple Top)SARS-COV-2 Vaccination 01/01/2020, 01/22/2020, 10/02/2020   Pneumococcal Conjugate-13 08/12/2014   Pneumococcal Polysaccharide-23 09/15/2015   Td 05/01/1998   Tdap 01/10/2014   Zoster Recombinat (Shingrix) 12/21/2018, 02/22/2019   Pneumococcal vaccine status: Due, Education has been provided regarding the importance of this vaccine. Advised may receive this vaccine at local pharmacy or Health Dept. Aware to provide a copy of the vaccination record if obtained from local pharmacy or Health Dept. Verbalized acceptance and understanding.  Screening Tests Health Maintenance  Topic Date Due   COVID-19 Vaccine (4 - 2023-24 season) 12/15/2022 (Originally 08/09/2022)   Pneumonia Vaccine 37+ Years old (3 - PPSV23 or PCV20) 11/30/2023 (Originally 09/14/2020)   OPHTHALMOLOGY EXAM  01/09/2023   MAMMOGRAM  03/13/2023   Diabetic kidney evaluation - Urine ACR  04/05/2023   FOOT EXAM  04/05/2023   HEMOGLOBIN A1C  04/05/2023   Diabetic kidney evaluation - eGFR measurement  10/05/2023   Medicare Annual Wellness (AWV)  11/30/2023   DTaP/Tdap/Td (3 - Td or Tdap) 01/11/2024   COLONOSCOPY (Pts 45-60yr Insurance coverage will need to be confirmed)  09/03/2027   INFLUENZA VACCINE  Completed   DEXA SCAN  Completed   Hepatitis C Screening  Completed   Zoster Vaccines- Shingrix  Completed   HPV VACCINES  Aged Out    Health Maintenance  There are no preventive care reminders to display for this patient.  Mammogram- ordered  Lung Cancer Screening: (Low Dose CT Chest recommended if Age 69-80years, 30 pack-year currently smoking OR have quit w/in 15years.) does not qualify.   Hepatitis C Screening: Completed 2019.  Vision Screening: Recommended annual  ophthalmology exams for early detection of glaucoma and other disorders of the eye.  Dental Screening: Recommended annual dental exams for proper oral hygiene.  Community Resource Referral / Chronic Care Management: CRR required this visit?  No   CCM required this visit?  No      Plan:     I have personally reviewed and noted the following in the patient's chart:   Medical and social history Use of alcohol, tobacco or illicit drugs  Current medications and supplements including opioid prescriptions. Patient is currently taking opioid prescriptions. Information provided to patient regarding non-opioid alternatives. Patient advised to discuss non-opioid treatment plan with their provider. Functional ability and status Nutritional status Physical activity Advanced directives List of other physicians Hospitalizations, surgeries, and ER visits in previous 12 months Vitals Screenings to include cognitive, depression, and falls Referrals and appointments  In addition, I have reviewed and discussed with patient certain preventive protocols, quality metrics, and best practice recommendations. A written personalized care plan for preventive services as well as general preventive  health recommendations were provided to patient.     Leta Jungling, LPN   [id number]/0100

## 2022-11-29 NOTE — Patient Instructions (Addendum)
Sara Mills , Thank you for taking time to come for your Medicare Wellness Visit. I appreciate your ongoing commitment to your health goals. Please review the following plan we discussed and let me know if I can assist you in the future.   These are the goals we discussed:  Goals Addressed               This Visit's Progress     Patient Stated     Increase physical activity (pt-stated)        Start water aerobic/therapy         This is a list of the screening recommended for you and due dates:  Health Maintenance  Topic Date Due   COVID-19 Vaccine (4 - 2023-24 season) 12/15/2022*   Pneumonia Vaccine (3 - PPSV23 or PCV20) 11/30/2023*   Eye exam for diabetics  01/09/2023   Mammogram  03/13/2023   Yearly kidney health urinalysis for diabetes  04/05/2023   Complete foot exam   04/05/2023   Hemoglobin A1C  04/05/2023   Yearly kidney function blood test for diabetes  10/05/2023   Medicare Annual Wellness Visit  11/30/2023   DTaP/Tdap/Td vaccine (3 - Td or Tdap) 01/11/2024   Colon Cancer Screening  09/03/2027   Flu Shot  Completed   DEXA scan (bone density measurement)  Completed   Hepatitis C Screening: USPSTF Recommendation to screen - Ages 39-79 yo.  Completed   Zoster (Shingles) Vaccine  Completed   HPV Vaccine  Aged Out  *Topic was postponed. The date shown is not the original due date.   Conditions/risks identified: none new.  Next appointment: Follow up in one year for your annual wellness visit    Preventive Care 65 Years and Older, Female Preventive care refers to lifestyle choices and visits with your health care provider that can promote health and wellness. What does preventive care include? A yearly physical exam. This is also called an annual well check. Dental exams once or twice a year. Routine eye exams. Ask your health care provider how often you should have your eyes checked. Personal lifestyle choices, including: Daily care of your teeth and  gums. Regular physical activity. Eating a healthy diet. Avoiding tobacco and drug use. Limiting alcohol use. Practicing safe sex. Taking low-dose aspirin every day. Taking vitamin and mineral supplements as recommended by your health care provider. What happens during an annual well check? The services and screenings done by your health care provider during your annual well check will depend on your age, overall health, lifestyle risk factors, and family history of disease. Counseling  Your health care provider may ask you questions about your: Alcohol use. Tobacco use. Drug use. Emotional well-being. Home and relationship well-being. Sexual activity. Eating habits. History of falls. Memory and ability to understand (cognition). Work and work Statistician. Reproductive health. Screening  You may have the following tests or measurements: Height, weight, and BMI. Blood pressure. Lipid and cholesterol levels. These may be checked every 5 years, or more frequently if you are over 16 years old. Skin check. Lung cancer screening. You may have this screening every year starting at age 74 if you have a 30-pack-year history of smoking and currently smoke or have quit within the past 15 years. Fecal occult blood test (FOBT) of the stool. You may have this test every year starting at age 88. Flexible sigmoidoscopy or colonoscopy. You may have a sigmoidoscopy every 5 years or a colonoscopy every 10 years starting at age 41.  Hepatitis C blood test. Hepatitis B blood test. Sexually transmitted disease (STD) testing. Diabetes screening. This is done by checking your blood sugar (glucose) after you have not eaten for a while (fasting). You may have this done every 1-3 years. Bone density scan. This is done to screen for osteoporosis. You may have this done starting at age 15. Mammogram. This may be done every 1-2 years. Talk to your health care provider about how often you should have regular  mammograms. Talk with your health care provider about your test results, treatment options, and if necessary, the need for more tests. Vaccines  Your health care provider may recommend certain vaccines, such as: Influenza vaccine. This is recommended every year. Tetanus, diphtheria, and acellular pertussis (Tdap, Td) vaccine. You may need a Td booster every 10 years. Zoster vaccine. You may need this after age 87. Pneumococcal 13-valent conjugate (PCV13) vaccine. One dose is recommended after age 67. Pneumococcal polysaccharide (PPSV23) vaccine. One dose is recommended after age 63. Talk to your health care provider about which screenings and vaccines you need and how often you need them. This information is not intended to replace advice given to you by your health care provider. Make sure you discuss any questions you have with your health care provider. Document Released: 12/22/2015 Document Revised: 08/14/2016 Document Reviewed: 09/26/2015 Elsevier Interactive Patient Education  2017 [place] Prevention in the Home Falls can cause injuries. They can happen to people of all ages. There are many things you can do to make your home safe and to help prevent falls. What can I do on the outside of my home? Regularly fix the edges of walkways and driveways and fix any cracks. Remove anything that might make you trip as you walk through a door, such as a raised step or threshold. Trim any bushes or trees on the path to your home. Use bright outdoor lighting. Clear any walking paths of anything that might make someone trip, such as rocks or tools. Regularly check to see if handrails are loose or broken. Make sure that both sides of any steps have handrails. Any raised decks and porches should have guardrails on the edges. Have any leaves, snow, or ice cleared regularly. Use sand or salt on walking paths during winter. Clean up any spills in your garage right away. This includes oil  or grease spills. What can I do in the bathroom? Use night lights. Install grab bars by the toilet and in the tub and shower. Do not use towel bars as grab bars. Use non-skid mats or decals in the tub or shower. If you need to sit down in the shower, use a plastic, non-slip stool. Keep the floor dry. Clean up any water that spills on the floor as soon as it happens. Remove soap buildup in the tub or shower regularly. Attach bath mats securely with double-sided non-slip rug tape. Do not have throw rugs and other things on the floor that can make you trip. What can I do in the bedroom? Use night lights. Make sure that you have a light by your bed that is easy to reach. Do not use any sheets or blankets that are too big for your bed. They should not hang down onto the floor. Have a firm chair that has side arms. You can use this for support while you get dressed. Do not have throw rugs and other things on the floor that can make you trip. What can I do in  the kitchen? Clean up any spills right away. Avoid walking on wet floors. Keep items that you use a lot in easy-to-reach places. If you need to reach something above you, use a strong step stool that has a grab bar. Keep electrical cords out of the way. Do not use floor polish or wax that makes floors slippery. If you must use wax, use non-skid floor wax. Do not have throw rugs and other things on the floor that can make you trip. What can I do with my stairs? Do not leave any items on the stairs. Make sure that there are handrails on both sides of the stairs and use them. Fix handrails that are broken or loose. Make sure that handrails are as long as the stairways. Check any carpeting to make sure that it is firmly attached to the stairs. Fix any carpet that is loose or worn. Avoid having throw rugs at the top or bottom of the stairs. If you do have throw rugs, attach them to the floor with carpet tape. Make sure that you have a light  switch at the top of the stairs and the bottom of the stairs. If you do not have them, ask someone to add them for you. What else can I do to help prevent falls? Wear shoes that: Do not have high heels. Have rubber bottoms. Are comfortable and fit you well. Are closed at the toe. Do not wear sandals. If you use a stepladder: Make sure that it is fully opened. Do not climb a closed stepladder. Make sure that both sides of the stepladder are locked into place. Ask someone to hold it for you, if possible. Clearly mark and make sure that you can see: Any grab bars or handrails. First and last steps. Where the edge of each step is. Use tools that help you move around (mobility aids) if they are needed. These include: Canes. Walkers. Scooters. Crutches. Turn on the lights when you go into a dark area. Replace any light bulbs as soon as they burn out. Set up your furniture so you have a clear path. Avoid moving your furniture around. If any of your floors are uneven, fix them. If there are any pets around you, be aware of where they are. Review your medicines with your doctor. Some medicines can make you feel dizzy. This can increase your chance of falling. Ask your doctor what other things that you can do to help prevent falls. This information is not intended to replace advice given to you by your health care provider. Make sure you discuss any questions you have with your health care provider. Document Released: 09/21/2009 Document Revised: 05/02/2016 Document Reviewed: 12/30/2014 Elsevier Interactive Patient Education  2017 [place]. Opioid Pain Medicine Management Opioids are powerful medicines that are used to treat moderate to severe pain. When used for short periods of time, they can help you to: Sleep better. Do better in physical or occupational therapy. Feel better in the first few days after an injury. Recover from surgery. Opioids should be taken with the supervision of a  trained health care provider. They should be taken for the shortest period of time possible. This is because opioids can be addictive, and the longer you take opioids, the greater your risk of addiction. This addiction can also be called opioid use disorder. What are the risks? Using opioid pain medicines for longer than 3 days increases your risk of side effects. Side effects include: Constipation. Nausea and vomiting. Breathing  difficulties (respiratory depression). Drowsiness. Confusion. Opioid use disorder. Itching. Taking opioid pain medicine for a long period of time can affect your ability to do daily tasks. It also puts you at risk for: Motor vehicle crashes. Depression. Suicide. Heart attack. Overdose, which can be life-threatening. What is a pain treatment plan? A pain treatment plan is an agreement between you and your health care provider. Pain is unique to each person, and treatments vary depending on your condition. To manage your pain, you and your health care provider need to work together. To help you do this: Discuss the goals of your treatment, including how much pain you might expect to have and how you will manage the pain. Review the risks and benefits of taking opioid medicines. Remember that a good treatment plan uses more than one approach and minimizes the chance of side effects. Be honest about the amount of medicines you take and about any drug or alcohol use. Get pain medicine prescriptions from only one health care provider. Pain can be managed with many types of alternative treatments. Ask your health care provider to refer you to one or more specialists who can help you manage pain through: Physical or occupational therapy. Counseling (cognitive behavioral therapy). Good nutrition. Biofeedback. Massage. Meditation. Non-opioid medicine. Following a gentle exercise program. How to use opioid pain medicine Taking medicine Take your pain medicine exactly  as told by your health care provider. Take it only when you need it. If your pain gets less severe, you may take less than your prescribed dose if your health care provider approves. If you are not having pain, do nottake pain medicine unless your health care provider tells you to take it. If your pain is severe, do nottry to treat it yourself by taking more pills than instructed on your prescription. Contact your health care provider for help. Write down the times when you take your pain medicine. It is easy to become confused while on pain medicine. Writing the time can help you avoid overdose. Take other over-the-counter or prescription medicines only as told by your health care provider. Keeping yourself and others safe  While you are taking opioid pain medicine: Do not drive, use machinery, or power tools. Do not sign legal documents. Do not drink alcohol. Do not take sleeping pills. Do not supervise children by yourself. Do not do activities that require climbing or being in high places. Do not go to a lake, river, ocean, spa, or swimming pool. Do not share your pain medicine with anyone. Keep pain medicine in a locked cabinet or in a secure area where pets and children cannot reach it. Stopping your use of opioids If you have been taking opioid medicine for more than a few weeks, you may need to slowly decrease (taper) how much you take until you stop completely. Tapering your use of opioids can decrease your risk of symptoms of withdrawal, such as: Pain and cramping in the abdomen. Nausea. Sweating. Sleepiness. Restlessness. Uncontrollable shaking (tremors). Cravings for the medicine. Do not attempt to taper your use of opioids on your own. Talk with your health care provider about how to do this. Your health care provider may prescribe a step-down schedule based on how much medicine you are taking and how long you have been taking it. Getting rid of leftover pills Do not save  any leftover pills. Get rid of leftover pills safely by: Taking the medicine to a prescription take-back program. This is usually offered by the county or  law enforcement. Bringing them to a pharmacy that has a drug disposal container. Flushing them down the toilet. Check the label or package insert of your medicine to see whether this is safe to do. Throwing them out in the trash. Check the label or package insert of your medicine to see whether this is safe to do. If it is safe to throw it out, remove the medicine from the original container, put it into a sealable bag or container, and mix it with used coffee grounds, food scraps, dirt, or cat litter before putting it in the trash. Follow these instructions at home: Activity Do exercises as told by your health care provider. Avoid activities that make your pain worse. Return to your normal activities as told by your health care provider. Ask your health care provider what activities are safe for you. General instructions You may need to take these actions to prevent or treat constipation: Drink enough fluid to keep your urine pale yellow. Take over-the-counter or prescription medicines. Eat foods that are high in fiber, such as beans, whole grains, and fresh fruits and vegetables. Limit foods that are high in fat and processed sugars, such as fried or sweet foods. Keep all follow-up visits. This is important. Where to find support If you have been taking opioids for a long time, you may benefit from receiving support for quitting from a local support group or counselor. Ask your health care provider for a referral to these resources in your area. Where to find more information Centers for Disease Control and Prevention (CDC): http://www.wolf.info/ U.[place]. Food and Drug Administration (FDA): [place] Get help right away if: You may have taken too much of an opioid (overdosed). Common symptoms of an overdose: Your breathing is slower or more shallow  than normal. You have a very slow heartbeat (pulse). You have slurred speech. You have nausea and vomiting. Your pupils become very small. You have other potential symptoms: You are very confused. You faint or feel like you will faint. You have cold, clammy skin. You have blue lips or fingernails. You have thoughts of harming yourself or harming others. These symptoms may represent a serious problem that is an emergency. Do not wait to see if the symptoms will go away. Get medical help right away. Call your local emergency services (911 in the [place] Do not drive yourself to the hospital.  If you ever feel like you may hurt yourself or others, or have thoughts about taking your own life, get help right away. Go to your nearest emergency department or: Call your local emergency services (911 in the [place] Call the [facility] 862-814-3859 in the [place] Call a suicide crisis helpline, such as the [place] at 2488669857 or 988 in the [place]. This is open 24 hours a day in the [place] Text the Crisis Text Line at (757) 684-8445 (in the [place].). Summary Opioid medicines can help you manage moderate to severe pain for a short period of time. A pain treatment plan is an agreement between you and your health care provider. Discuss the goals of your treatment, including how much pain you might expect to have and how you will manage the pain. If you think that you or someone else may have taken too much of an opioid, get medical help right away. This information is not intended to replace advice given to you by your health care provider. Make sure you discuss any questions you have with your health care provider. Document  Revised: 06/20/2021 Document Reviewed: 03/07/2021 Elsevier Patient Education  [place].

## 2022-12-03 ENCOUNTER — Encounter: Payer: Self-pay | Admitting: Family Medicine

## 2022-12-03 ENCOUNTER — Ambulatory Visit (INDEPENDENT_AMBULATORY_CARE_PROVIDER_SITE_OTHER): Payer: Medicare Other | Admitting: Family Medicine

## 2022-12-03 VITALS — BP 144/92 | HR 72 | Temp 97.6°F | Ht 61.0 in | Wt 148.0 lb

## 2022-12-03 DIAGNOSIS — M5126 Other intervertebral disc displacement, lumbar region: Secondary | ICD-10-CM

## 2022-12-03 DIAGNOSIS — R300 Dysuria: Secondary | ICD-10-CM

## 2022-12-03 DIAGNOSIS — I10 Essential (primary) hypertension: Secondary | ICD-10-CM

## 2022-12-03 LAB — POCT URINALYSIS DIPSTICK
Bilirubin, UA: NEGATIVE
Blood, UA: NEGATIVE
Glucose, UA: NEGATIVE
Ketones, UA: NEGATIVE
Leukocytes, UA: NEGATIVE
Nitrite, UA: NEGATIVE
Protein, UA: NEGATIVE
Spec Grav, UA: 1.025 (ref 1.010–1.025)
Urobilinogen, UA: 0.2 E.U./dL
pH, UA: 6 (ref 5.0–8.0)

## 2022-12-03 NOTE — Progress Notes (Signed)
SUBJECTIVE:   Chief Complaint  Patient presents with   Dysuria   HPI  Urinary Tract Infection: Patient complains of worsening back pain  She has had symptoms for 3 days. Reports having history of back pain but because was severe on Saturday night, friends told her she may have a urinary infection and should get checked.  She is not having any urinary symptoms.  Patient denies fever, stomach ache, vaginal discharge, and hematuria .  Denies any bowel or bladder incontinence.  Denies any saddle anesthesia or lower extremity weakness.  Patient does not have a history of recurrent UTI.  Patient does not have a history of pyelonephritis.  Recently had increase in activity with preparing for Christmas.  Had not been taking Gabapentin as previously prescribed.  She follows with Dr. Rolena Infante and Dr. Herma Mering at [facility].  PERTINENT PMH / PSH: Chronic back pain  OBJECTIVE:  BP (!) 144/92   Pulse 72   Temp 97.6 F (36.4 C) (Oral)   Ht '5\' 1"'$  (1.549 m)   Wt 148 lb (67.1 kg)   SpO2 98%   BMI 27.96 kg/m    Physical Exam Vitals reviewed.  Constitutional:      General: She is not in acute distress.    Appearance: Normal appearance. She is normal weight. She is not ill-appearing, toxic-appearing or diaphoretic.  Eyes:     General:        Right eye: No discharge.        Left eye: No discharge.     Conjunctiva/sclera: Conjunctivae normal.  Cardiovascular:     Rate and Rhythm: Normal rate and regular rhythm.     Heart sounds: Normal heart sounds.  Pulmonary:     Effort: Pulmonary effort is normal.     Breath sounds: Normal breath sounds.  Abdominal:     General: There is no distension.     Tenderness: There is no abdominal tenderness. There is no right CVA tenderness or left CVA tenderness.  Musculoskeletal:        General: Normal range of motion.  Skin:    General: Skin is warm and dry.  Neurological:     General: No focal deficit present.     Mental Status: She is alert and oriented  to person, place, and time. Mental status is at baseline.  Psychiatric:        Mood and Affect: Mood normal.        Behavior: Behavior normal.        Thought Content: Thought content normal.        Judgment: Judgment normal.     ASSESSMENT/PLAN:  Low back pain due to displacement of intervertebral disc Assessment & Plan: Acute on chronic.  Suspect musculoskeletal given recent increase in activity and joint Christmas season.  No red flags.  Low suspicion for UTI/pyelonephritis given no fevers or urinary symptoms. Urine negative for leukocytes or nitrites Will send for culture Restart gabapentin 300 mg 2 times daily Continue Zanaflex 4 mg every 6 hours as needed for muscle spasm Continue tramadol 50 mg twice daily as previously prescribed Tylenol arthritis 1300 mg 3 times daily  Can use heat and ice as needed Can use lidocaine patch as needed Can use Biofreeze 3-4 times daily as needed Consider physical therapy Follow-up with Dr. Rolena Infante at [facility] Follow-up with Dr. Nelva Bush at [facility]  Orders: -     POCT urinalysis dipstick -     Urine Culture -     Urine Microscopic  Essential hypertension Assessment & Plan: Chronic.  Elevated likely secondary to pain. Continue Cozaar 50 mg daily Monitor blood pressure at home.  Goal BP less than 130/80 Follow-up with PCP for BP management Strict return precautions provided    PDMP reviewed  Return in about 1 month (around 01/03/2023), or if symptoms worsen or fail to improve, for PCP, blood pressure.  Carollee Leitz, MD

## 2022-12-03 NOTE — Patient Instructions (Signed)
It was a pleasure meeting you today. Thank you for allowing me to take part in your health care.  Our goals for today as we discussed include:  For your back pain Restart gabapentin 300 mg 2 times daily Continue Zanaflex 4 mg every 6 hours as needed for muscle spasm Continue tramadol 50 mg twice daily as previously prescribed Tylenol arthritis 1300 mg 3 times daily  Can use heat and ice as needed Can use lidocaine patch as needed Can use Biofreeze 3-4 times daily as needed Consider physical therapy Follow-up with Dr. Rolena Infante at [facility] Follow-up with Dr. Nelva Bush at [facility]   For your blood pressure Your blood pressure is elevated today and continues to remain elevated.  Likely secondary to discomfort. Continue losartan 50 mg daily. Follow-up with PCP as scheduled.  Your urine test was negative for infection. Will send off for evaluation.   If you have any questions or concerns, please do not hesitate to call the office at [phone].  I look forward to our next visit and until then take care and stay safe.  Regards,   Carollee Leitz, MD   [facility] restart gabapentin 200 mg

## 2022-12-04 ENCOUNTER — Encounter: Payer: Self-pay | Admitting: Family Medicine

## 2022-12-04 LAB — URINE CULTURE
MICRO NUMBER:: 14357002
SPECIMEN QUALITY:: ADEQUATE

## 2022-12-04 LAB — URINALYSIS, MICROSCOPIC ONLY: RBC / HPF: NONE SEEN (ref 0–?)

## 2022-12-09 ENCOUNTER — Other Ambulatory Visit: Payer: Self-pay | Admitting: Internal Medicine

## 2022-12-15 ENCOUNTER — Encounter: Payer: Self-pay | Admitting: Family Medicine

## 2022-12-15 NOTE — Assessment & Plan Note (Signed)
Acute on chronic.  Suspect musculoskeletal given recent increase in activity and joint Christmas season.  No red flags.  Low suspicion for UTI/pyelonephritis given no fevers or urinary symptoms. Urine negative for leukocytes or nitrites Will send for culture Restart gabapentin 300 mg 2 times daily Continue Zanaflex 4 mg every 6 hours as needed for muscle spasm Continue tramadol 50 mg twice daily as previously prescribed Tylenol arthritis 1300 mg 3 times daily  Can use heat and ice as needed Can use lidocaine patch as needed Can use Biofreeze 3-4 times daily as needed Consider physical therapy Follow-up with Dr. Rolena Infante at [facility] Follow-up with Dr. Nelva Bush at [facility]

## 2022-12-15 NOTE — Assessment & Plan Note (Signed)
Chronic.  Elevated likely secondary to pain. Continue Cozaar 50 mg daily Monitor blood pressure at home.  Goal BP less than 130/80 Follow-up with PCP for BP management Strict return precautions provided

## 2022-12-27 ENCOUNTER — Other Ambulatory Visit: Payer: Self-pay | Admitting: Family

## 2022-12-30 ENCOUNTER — Other Ambulatory Visit: Payer: Self-pay | Admitting: Internal Medicine

## 2023-01-03 ENCOUNTER — Other Ambulatory Visit: Payer: Self-pay | Admitting: Internal Medicine

## 2023-01-03 MED ORDER — GABAPENTIN 300 MG PO CAPS: 300.0000 mg | ORAL_CAPSULE | Freq: Two times a day (BID) | ORAL | 1 refills | Status: DC

## 2023-01-06 ENCOUNTER — Ambulatory Visit (INDEPENDENT_AMBULATORY_CARE_PROVIDER_SITE_OTHER): Payer: Medicare Other | Admitting: Internal Medicine

## 2023-01-06 ENCOUNTER — Encounter: Payer: Self-pay | Admitting: Internal Medicine

## 2023-01-06 VITALS — BP 138/84 | HR 65 | Temp 97.5°F | Ht 61.0 in | Wt 148.0 lb

## 2023-01-06 DIAGNOSIS — J069 Acute upper respiratory infection, unspecified: Secondary | ICD-10-CM

## 2023-01-06 DIAGNOSIS — N1831 Chronic kidney disease, stage 3a: Secondary | ICD-10-CM

## 2023-01-06 DIAGNOSIS — E669 Obesity, unspecified: Secondary | ICD-10-CM

## 2023-01-06 DIAGNOSIS — I10 Essential (primary) hypertension: Secondary | ICD-10-CM

## 2023-01-06 DIAGNOSIS — K760 Fatty (change of) liver, not elsewhere classified: Secondary | ICD-10-CM

## 2023-01-06 DIAGNOSIS — E1169 Type 2 diabetes mellitus with other specified complication: Secondary | ICD-10-CM

## 2023-01-06 DIAGNOSIS — E785 Hyperlipidemia, unspecified: Secondary | ICD-10-CM

## 2023-01-06 LAB — COMPREHENSIVE METABOLIC PANEL
ALT: 76 U/L — ABNORMAL HIGH (ref 0–35)
AST: 55 U/L — ABNORMAL HIGH (ref 0–37)
Albumin: 4.6 g/dL (ref 3.5–5.2)
Alkaline Phosphatase: 93 U/L (ref 39–117)
BUN: 14 mg/dL (ref 6–23)
CO2: 28 mEq/L (ref 19–32)
Calcium: 9.7 mg/dL (ref 8.4–10.5)
Chloride: 101 mEq/L (ref 96–112)
Creatinine, Ser: 0.98 mg/dL (ref 0.40–1.20)
GFR: 58.91 mL/min — ABNORMAL LOW (ref >60.00)
Glucose, Bld: 82 mg/dL (ref 70–99)
Potassium: 4.1 mEq/L (ref 3.5–5.1)
Sodium: 140 mEq/L (ref 135–145)
Total Bilirubin: 0.6 mg/dL (ref 0.2–1.2)
Total Protein: 7.4 g/dL (ref 6.0–8.3)

## 2023-01-06 LAB — MICROALBUMIN / CREATININE URINE RATIO
Creatinine,U: 36.1 mg/dL
Microalb Creat Ratio: 2.4 mg/g (ref 0.0–30.0)
Microalb, Ur: 0.9 mg/dL (ref 0.0–1.9)

## 2023-01-06 LAB — HEMOGLOBIN A1C: Hgb A1c MFr Bld: 7.1 % — ABNORMAL HIGH (ref 4.6–6.5)

## 2023-01-06 MED ORDER — HYDROCHLOROTHIAZIDE 12.5 MG PO CAPS: 12.5000 mg | ORAL_CAPSULE | Freq: Every day | ORAL | 0 refills | Status: DC

## 2023-01-06 NOTE — Assessment & Plan Note (Signed)
Her symptoms do not suggest that she has a bacterial infection and therefore does not need to take an antibiotic . She can  addAfrin nasal spray for the evening dose to avoid insomnia.

## 2023-01-06 NOTE — Assessment & Plan Note (Signed)
Adding hct for BP not at goal

## 2023-01-06 NOTE — Assessment & Plan Note (Addendum)
Taking metformin .  Statin intolerant ,  Iiver enzymes rhave become elevated after normalizing.    Lab Results  Component Value Date   ALT 76 (H) 01/06/2023   AST 55 (H) 01/06/2023   ALKPHOS 93 01/06/2023   BILITOT 0.6 01/06/2023

## 2023-01-06 NOTE — Assessment & Plan Note (Signed)
She has requested a suspension of statin due to concern for its effect on her glycemic control. 3 month suspension planned.  LDL and a1c are above goal  Lab Results  Component Value Date   CHOL 148 10/04/2022   HDL 42.80 10/04/2022   LDLCALC 72 05/23/2020   LDLDIRECT 80.0 10/04/2022   TRIG 221.0 (H) 10/04/2022   CHOLHDL 3 10/04/2022   Lab Results  Component Value Date   HGBA1C 7.1 (H) 01/06/2023

## 2023-01-06 NOTE — Assessment & Plan Note (Signed)
Taking glipzide and metformin regularly,  advised to add evening dose of glipizide.  Continue Losartan . She is statin intolerant /  Lab Results  Component Value Date   HGBA1C 7.1 (H) 01/06/2023   Lab Results  Component Value Date   MICROALBUR 0.9 01/06/2023   MICROALBUR 3.4 (H) 04/04/2022

## 2023-01-06 NOTE — Patient Instructions (Addendum)
For your viral infection:  Try adding Afrin twice daily (generic oxymetazolone ) to  prevent sinus congestion from turning in to sinusitis  Continue sinus flush with saline   For the diabetes :  Remember to Suspend the glipizide if you are sick of if your eating is OFF  to prevent low sugars   For your back:  [employer] :  for your pool exercises look online for the company

## 2023-01-06 NOTE — Assessment & Plan Note (Signed)
multifactorial  Due to DM,  HTN and NSAID use. Nephrology referral has been done.  She is avoiding NSAIDs and taking an ARB.

## 2023-01-06 NOTE — Progress Notes (Signed)
Subjective:  Patient ID: Sara Mills, female    DOB: [birth year]  Age: 72 y.o. MRN: [id number]  CC: The primary encounter diagnosis was Essential hypertension. Diagnoses of Hyperlipidemia due to type 2 diabetes mellitus ([place]), Diabetes mellitus type 2 in obese ([place]), Stage 3a chronic kidney disease ([place]), Hepatic steatosis, and Viral URI were also pertinent to this visit.   HPI Sara Mills presents for  Chief Complaint  Patient presents with   Medical Management of Chronic Issues    3 month f/u   1) T2DM:  she has been taking 1/2 tablet of glipizide daily   and metformin  twice daily;  she is  checking BS daily in the am fasting and occasionally randomly in the afternoon .    121 today 30 day average 128 .  Had one  hypoglycemic episode of 54, when lunch was delayed   2) viral URI: started with a bad headache on Thursday, scratchy  throat on Friday,  Cough on Saturday,  sinuses congested but clear rhinorrhea   3)HTN:  BP has been in the 140's at home on losartan   4) recent episode of back pain. Treated by TW dec 26. Resolved.   Still has tramadol leftover.    Still waiting for aquatic therapy ordered by orthopedics for place in [place]      Outpatient Medications Prior to Visit  Medication Sig Dispense Refill   ACCU-CHEK GUIDE test strip USE 1 STRIP TO CHECK BLOOD GLUCOSE ONCE A DAY 100 strip 1   gabapentin (NEURONTIN) 300 MG capsule Take 1 capsule (300 mg total) by mouth 2 (two) times daily. 120 capsule 1   glipiZIDE (GLUCOTROL) 5 MG tablet TAKE 1/2 TABLET (2.5 MG TOTAL) BY MOUTH TWICE DAILY BEFORE A MEAL 90 tablet 1   losartan (COZAAR) 50 MG tablet Take 1 tablet (50 mg total) by mouth daily. 90 tablet 1   metFORMIN (GLUCOPHAGE) 1000 MG tablet TAKE 1 TABLET (1,000 MG TOTAL) BY MOUTH TWICE A DAY WITH FOOD 180 tablet 1   tiZANidine (ZANAFLEX) 4 MG tablet Take 1 tablet (4 mg total) by mouth every 6 (six) hours as needed for muscle spasms. 90 tablet 1   Polyethyl Glycol-Propyl  Glycol (SYSTANE OP) Place 1 drop into both eyes 2 (two) times daily as needed (dry eyes). (Patient not taking: Reported on 01/06/2023)     traMADol (ULTRAM) 50 MG tablet Take 1 tablet (50 mg total) by mouth 2 (two) times daily. (Patient not taking: Reported on 10/04/2022) 60 tablet 5   No facility-administered medications prior to visit.    Review of Systems;  Patient denies headache, fevers, malaise, unintentional weight loss, skin rash, eye pain, sinus congestion and sinus pain, sore throat, dysphagia,  hemoptysis , cough, dyspnea, wheezing, chest pain, palpitations, orthopnea, edema, abdominal pain, nausea, melena, diarrhea, constipation, flank pain, dysuria, hematuria, urinary  Frequency, nocturia, numbness, tingling, seizures,  Focal weakness, Loss of consciousness,  Tremor, insomnia, depression, anxiety, and suicidal ideation.      Objective:  BP 138/84   Pulse 65   Temp (!) 97.5 F (36.4 C)   Ht '5\' 1"'$  (1.549 m)   Wt 148 lb (67.1 kg)   SpO2 99%   BMI 27.96 kg/m   BP Readings from Last 3 Encounters:  01/06/23 138/84  12/03/22 (!) 144/92  10/04/22 118/62    Wt Readings from Last 3 Encounters:  01/06/23 148 lb (67.1 kg)  12/03/22 148 lb (67.1 kg)  11/29/22 145 lb (65.8  kg)    Physical Exam Vitals reviewed.  Constitutional:      General: She is not in acute distress.    Appearance: Normal appearance. She is normal weight. She is not ill-appearing, toxic-appearing or diaphoretic.  HENT:     Head: Normocephalic.  Eyes:     General: No scleral icterus.       Right eye: No discharge.        Left eye: No discharge.     Conjunctiva/sclera: Conjunctivae normal.  Cardiovascular:     Rate and Rhythm: Normal rate and regular rhythm.     Heart sounds: Normal heart sounds.  Pulmonary:     Effort: Pulmonary effort is normal. No respiratory distress.     Breath sounds: Normal breath sounds.  Musculoskeletal:        General: Normal range of motion.  Skin:    General: Skin is  warm and dry.  Neurological:     General: No focal deficit present.     Mental Status: She is alert and oriented to person, place, and time. Mental status is at baseline.  Psychiatric:        Mood and Affect: Mood normal.        Behavior: Behavior normal.        Thought Content: Thought content normal.        Judgment: Judgment normal.     Lab Results  Component Value Date   HGBA1C 7.1 (H) 01/06/2023   HGBA1C 7.2 (H) 10/04/2022   HGBA1C 7.2 (H) 04/04/2022    Lab Results  Component Value Date   CREATININE 0.98 01/06/2023   CREATININE 0.90 10/04/2022   CREATININE 1.30 (H) 06/01/2022    Lab Results  Component Value Date   WBC 6.4 10/04/2022   HGB 12.0 10/04/2022   HCT 36.2 10/04/2022   PLT 117.0 (L) 10/04/2022   GLUCOSE 82 01/06/2023   CHOL 148 10/04/2022   TRIG 221.0 (H) 10/04/2022   HDL 42.80 10/04/2022   LDLDIRECT 80.0 10/04/2022   LDLCALC 72 05/23/2020   ALT 76 (H) 01/06/2023   AST 55 (H) 01/06/2023   NA 140 01/06/2023   K 4.1 01/06/2023   CL 101 01/06/2023   CREATININE 0.98 01/06/2023   BUN 14 01/06/2023   CO2 28 01/06/2023   TSH 1.86 10/04/2022   INR 1.1 06/02/2022   HGBA1C 7.1 (H) 01/06/2023   MICROALBUR 0.9 01/06/2023    US RENAL  Result Date: 07/30/2022 CLINICAL DATA:  Chronic renal disease EXAM: RENAL / URINARY TRACT ULTRASOUND COMPLETE COMPARISON:  None Available. FINDINGS: Right Kidney: Renal measurements: 9.6 x 5.3 x 5.0 cm = volume: 134 mL. The right renal pelvis is fluid-filled and mildly prominent. No caliectasis. Left Kidney: Renal measurements: 9.5 x 4.6 x 4.2 cm = volume: 96 mL. Echogenicity within normal limits. No mass or hydronephrosis visualized. Bladder: Appears normal for degree of bladder distention. Other: None. IMPRESSION: 1. The right renal pelvis is fluid-filled and mildly prominent without caliectasis. The kidneys and bladder are otherwise normal. Electronically Signed   By: Dorise Bullion III M.D.   On: 07/30/2022 11:54     Assessment & Plan:  .Essential hypertension Assessment & Plan: Adding hct for BP not at goal    Hyperlipidemia due to type 2 diabetes mellitus ([place]) Assessment & Plan: She has requested a suspension of statin due to concern for its effect on her glycemic control. 3 month suspension planned.  LDL and a1c are above goal  Lab Results  Component Value  Date   CHOL 148 10/04/2022   HDL 42.80 10/04/2022   LDLCALC 72 05/23/2020   LDLDIRECT 80.0 10/04/2022   TRIG 221.0 (H) 10/04/2022   CHOLHDL 3 10/04/2022   Lab Results  Component Value Date   HGBA1C 7.1 (H) 01/06/2023     Orders: -     hydroCHLOROthiazide; Take 1 capsule (12.5 mg total) by mouth daily.  Dispense: 90 capsule; Refill: 0 -     Lipid Panel w/reflex Direct LDL  Diabetes mellitus type 2 in obese [facility]) Assessment & Plan: Taking glipzide and metformin regularly,  advised to add evening dose of glipizide.  Continue Losartan . She is statin intolerant /  Lab Results  Component Value Date   HGBA1C 7.1 (H) 01/06/2023   Lab Results  Component Value Date   MICROALBUR 0.9 01/06/2023   MICROALBUR 3.4 (H) 04/04/2022       Orders: -     Hemoglobin A1c -     Microalbumin / creatinine urine ratio  Stage 3a chronic kidney disease ([place]) Assessment & Plan:  multifactorial  Due to DM,  HTN and NSAID use. Nephrology referral has been done.  She is avoiding NSAIDs and taking an ARB.     Orders: -     Comprehensive metabolic panel  Hepatic steatosis Assessment & Plan: Taking metformin .  Statin intolerant ,  Iiver enzymes rhave become elevated after normalizing.    Lab Results  Component Value Date   ALT 76 (H) 01/06/2023   AST 55 (H) 01/06/2023   ALKPHOS 93 01/06/2023   BILITOT 0.6 01/06/2023      Viral URI Assessment & Plan: Her symptoms do not suggest that she has a bacterial infection and therefore does not need to take an antibiotic . She can  addAfrin nasal spray for the evening dose to avoid  insomnia.       I provided 30 minutes of face-to-face time during this encounter reviewing patient's last visit with me, patient's  most recent visit with cardiology,  nephrology,  and neurology,  recent surgical and non surgical procedures, previous  labs and imaging studies, counseling on currently addressed issues,  and post visit ordering to diagnostics and therapeutics .   Follow-up: Return in about 6 months (around 07/07/2023).   Crecencio Mc, MD

## 2023-01-07 LAB — LIPID PANEL W/REFLEX DIRECT LDL
Cholesterol: 233 mg/dL — ABNORMAL HIGH
HDL: 42 mg/dL — ABNORMAL LOW
LDL Cholesterol (Calc): 143 mg/dL — ABNORMAL HIGH
Non-HDL Cholesterol (Calc): 191 mg/dL — ABNORMAL HIGH
Total CHOL/HDL Ratio: 5.5 (calc) — ABNORMAL HIGH
Triglycerides: 315 mg/dL — ABNORMAL HIGH

## 2023-01-09 ENCOUNTER — Telehealth: Payer: Self-pay

## 2023-01-09 NOTE — Telephone Encounter (Signed)
-----  Message from Crecencio Mc, MD sent at 01/09/2023  5:48 AM EST ----- Your diabetes is still under good control on current regimen, but your cholesterl has risen by 100 pts and your liver enzymes are elevated . Please schedule a return visit to discuss treatment  of both.   Regards,   Deborra Medina, MD

## 2023-02-11 NOTE — Therapy (Signed)
OUTPATIENT PHYSICAL THERAPY THORACOLUMBAR EVALUATION   Patient Name: Sara Mills MRN: [id number] DOB:11-07-1951, 72 y.o., female Today's Date: 02/14/2023  END OF SESSION:  PT End of Session - 02/13/23 1111     Visit Number 1    Number of Visits 12    Date for PT Re-Evaluation 03/27/23    Authorization Type Mcr    Progress Note Due on Visit 10    PT Start Time 1110    PT Stop Time 1152    PT Time Calculation (min) 42 min    Activity Tolerance Patient tolerated treatment well    Behavior During Therapy WFL for tasks assessed/performed             Past Medical History:  Diagnosis Date   Angioedema 06/08/2017   Diabetes mellitus    GERD (gastroesophageal reflux disease)    Hyperlipidemia    Hypertension    Past Surgical History:  Procedure Laterality Date   ABDOMINAL HYSTERECTOMY  1995   and BSO   APPENDECTOMY     BILATERAL OOPHORECTOMY     BREAST BIOPSY Right 02/15/2021   right breast calcs, x marker, BENIGN BREAST TISSUE WITH FIBROADENOMATOID CHANGES ANDASSOCIATED CALCIFICATIONS. - NEGATIVE FOR ATYPIA AND MALIGNANCY   BUNIONECTOMY     CARPAL TUNNEL RELEASE Right    CESAREAN [place] and 1978   Patient Active Problem List   Diagnosis Date Noted   Stage 3a chronic kidney disease ([place]) 05/20/2022   Renal insufficiency, mild 05/11/2022   Tinnitus aurium, left 04/04/2022   Breast calcifications on mammogram 02/03/2021   History of pneumonia 10/05/2020   Viral URI 05/19/2020   Herpes simplex 05/20/2019   Angiotensin converting enzyme inhibitor-aggravated angioedema 02/20/2019   Hepatic steatosis 02/20/2019   Cervical spondylosis with radiculopathy 11/19/2018   Low back pain due to displacement of intervertebral disc 09/29/2018   Mills-term use of high-risk medication 12/20/2017   Sacro-iliac pain 06/08/2017   Encounter for Mills-term (current) use of other high-risk medications 09/15/2015   Encounter for preventive health examination 01/12/2014   Diabetes  mellitus type 2 in obese ([place]) 08/26/2012   Overweight (BMI 25.0-29.9) 03/22/2012   Premature menopause on hormone replacement therapy 03/20/2012   Essential hypertension    THYROID NODULE 08/06/2007   COLONIC POLYPS 07/27/2007   Hyperlipidemia due to type 2 diabetes mellitus ([place]) 07/27/2007   GERD 12/09/2004    PCP: Sara Mc, MD   REFERRING PROVIDER: Melina Schools, MD   REFERRING DIAG: M54.16 (ICD-10-CM) - Radiculopathy, lumbar region   Rationale for Evaluation and Treatment: Rehabilitation  THERAPY DIAG:  Other low back pain  Muscle weakness (generalized)  ONSET DATE: >5 yrs  SUBJECTIVE:  SUBJECTIVE STATEMENT:  Injection in c-spine, thoracic and lumbar spine with minimal results.  Oa, DDD. Can't get up from chairs mostly in am.  After walking 10-20 ft I am better.  Rolling in bed increases pain. Came off meds after dx for stage III kidney disease a few months back and rendered me practically bed bound. Went back to taking Gabapentin and it has made me function better. Able to shop and walk Mills periods of time. Not too limited just painful to move.  PERTINENT HISTORY:   As per Sara Mills 01/06/23 recent episode of back pain. Treated by Sara Mills dec 26. Resolved. Still has tramadol leftover. Still waiting for aquatic therapy ordered by orthopedics for place in [place]:  Are you having pain? Yes: NPRS scale: current 0/10  worst 6/10 Pain location: iliac crest right sided Pain description: sharp Aggravating factors: getting up from a chair or OOB, moving in bed Relieving factors: sitting still or up moving around  PRECAUTIONS: Back  WEIGHT BEARING RESTRICTIONS: No  FALLS:  Has patient fallen in last 6 months? No  LIVING ENVIRONMENT: Lives with: lives with their spouse Lives in:  House/apartment Stairs: Yes: Internal: 16 steps; on right going up Has following equipment at home: None  OCCUPATION: retired  PLOF: Independent  PATIENT GOALS: Build up muscles, move with less pain, return to walking  NEXT MD VISIT: as needed  OBJECTIVE:   DIAGNOSTIC FINDINGS:  MRI cervical spine 4/22 to be sent over by dtr [facility] No recent dx for LB  PATIENT SURVEYS:  FOTO Primary score:57% with goal of 61% visit 11  SCREENING FOR RED FLAGS: none  COGNITION: Overall cognitive status: Within functional limits for tasks assessed     SENSATION: Pt reports no radicular pain. In past she has after cyct removed no more pain  MUSCLE LENGTH: Hamstrings: Right 70 deg; Left 80 deg   POSTURE: No Significant postural limitations but guarded/stiff  PALPATION: Tight paraspinals throughout thoracic spine.  TTP about    Cervical ROM: WFL but pain at end ranges   LUMBAR ROM:    Pain and end range AROM eval  Flexion full  Extension full  Right lateral flexion 50%  Left lateral flexion full  Right rotation full  Left rotation full   (Blank rows = not tested)  LOWER EXTREMITY ROM:     WFL  LOWER EXTREMITY MMT:    Hip strength: grossly 4-/5  LUMBAR SPECIAL TESTS:  Straight leg raise test: Negative, Slump test: Negative, and Thomas test: Negative  FUNCTIONAL TESTS:  5 times sit to stand: 16.60 Timed up and go (TUG): 9.26 4 stage balance test 16/16  GAIT: Distance walked: >568f Assistive device utilized: None Level of assistance: Complete Independence Comments: slow with STS transfer, initial gait antalgic, cadence slowed shorten step length after 20 ft gait normalizes  TODAY'S TREATMENT:  Eval Objective testing Pt edu   PATIENT EDUCATION:  Education details: Discussed eval findings, rehab rationale and POC and patient is  in agreement Person educated: Patient and Child(ren) Education method: Explanation Education comprehension: verbalized understanding  HOME EXERCISE PROGRAM: Aquatic tba Cervical TBA  ASSESSMENT:  CLINICAL IMPRESSION: Patient is a 72 y.o. f who was seen today for physical therapy evaluation and treatment for lumbar pain.  Pt has a Mills hx of cervical through lumbar spine dysfunction over past few years. No diagnositcs in chart but Dtr will have sent over. Functionally she demonstrates good ROM throughout spine but with pain at end ranges, slight limitation in R lat lumbar flex. [facility].  Strength deficit slight in bilat hips. Main complaint is pain with sit to stand particularly in am getting OOB.  She does not let her pain limit her as she continues with shopping and gardening.  She did stop taking gabapentenin a few weeks back due to concerns of her kidney function but has returned to it as she was very limited to pain reportedly unable to move much at all.  OBJECTIVE IMPAIRMENTS: decreased activity tolerance, decreased strength, postural dysfunction, and pain.   ACTIVITY LIMITATIONS: sleeping, transfers, and caring for others  PARTICIPATION LIMITATIONS: driving, shopping, community activity, and yard work  PERSONAL FACTORS: 1-2 comorbidities: kidney dysfunction/ability to take meds; cervical through lumbar spine dysfunction  are also affecting patient's functional outcome.   REHAB POTENTIAL: Good  CLINICAL DECISION MAKING: Evolving/moderate complexity  EVALUATION COMPLEXITY: Moderate   GOALS: Goals reviewed with patient? No  SHORT TERM GOALS: Target date: 03/07/23  Pt will tolerate full aquatic sessions consistently without increase in pain and with improving function to demonstrate good toleration and effectiveness of intervention.   Baseline: Goal status: INITIAL  2.  Pt will complete 10 consecutive STS from 4th step (minimal submersion) without increase in  pain. Baseline: pain with STS Goal status: INITIAL  3.  Pt will report decreased worst pain to <5/10 for improved toleration to functional mobility Baseline: 6/10 Goal status: INITIAL  4.  Pt will be indep and compliant with cervical ROM HEP Baseline:none  Goal status: INITIAL  5.  Pt will be indep with proper mechanics with sup<>sit Baseline: extreme pain/questionable technique Goal status: INITIAL   Mills TERM GOALS: Target date: 03/27/23  Pt to meet stated Foto Goal 61 Baseline: 46% risk adjusted Goal status: INITIAL  2.  Pt will improve on 5 X STS test to <or=  12s  to demonstrate improving functional lower extremity strength, transitional movements, and balance  Baseline: 16.60 Goal status: INITIAL  3.  Pt will be indep with final HEP's (land and aquatic as appropriate) for continued management of condition  Baseline: none Goal status: INITIAL  4.  Pt will increase hip strength by up to 1 full grade to meet personal goal and improve mobility Baseline: see chart Goal status: INITIAL    PLAN:  PT FREQUENCY: 1-2x/week  PT DURATION: 6 weeks  PLANNED INTERVENTIONS: Therapeutic exercises, Therapeutic activity, Neuromuscular re-education, Balance training, Gait training, Patient/Family education, Self Care, Joint mobilization, Joint manipulation, Stair training, Orthotic/Fit training, DME instructions, Aquatic Therapy, Dry Needling, Electrical stimulation, Moist heat, Taping, Traction, Ultrasound, Ionotophoresis '4mg'$ /ml Dexamethasone, Manual therapy, and Re-evaluation.  PLAN FOR NEXT SESSION: aquatic for core strength to improved stability and LE strength for improved mobility. STS transfers, bed mobility. HEP   Denton Meek, PTMPT 02/14/2023, 9:30 AM

## 2023-02-12 ENCOUNTER — Other Ambulatory Visit: Payer: Self-pay | Admitting: Internal Medicine

## 2023-02-12 DIAGNOSIS — Z0000 Encounter for general adult medical examination without abnormal findings: Secondary | ICD-10-CM

## 2023-02-12 DIAGNOSIS — Z1231 Encounter for screening mammogram for malignant neoplasm of breast: Secondary | ICD-10-CM

## 2023-02-13 ENCOUNTER — Ambulatory Visit (HOSPITAL_BASED_OUTPATIENT_CLINIC_OR_DEPARTMENT_OTHER): Payer: Medicare Other | Attending: Orthopedic Surgery | Admitting: Physical Therapy

## 2023-02-13 ENCOUNTER — Encounter (HOSPITAL_BASED_OUTPATIENT_CLINIC_OR_DEPARTMENT_OTHER): Payer: Self-pay | Admitting: Physical Therapy

## 2023-02-13 ENCOUNTER — Other Ambulatory Visit: Payer: Self-pay

## 2023-02-13 DIAGNOSIS — M5459 Other low back pain: Secondary | ICD-10-CM | POA: Insufficient documentation

## 2023-02-13 DIAGNOSIS — M5416 Radiculopathy, lumbar region: Secondary | ICD-10-CM | POA: Diagnosis not present

## 2023-02-13 DIAGNOSIS — M6281 Muscle weakness (generalized): Secondary | ICD-10-CM | POA: Insufficient documentation

## 2023-02-18 ENCOUNTER — Encounter: Payer: Self-pay | Admitting: Internal Medicine

## 2023-02-18 ENCOUNTER — Ambulatory Visit (INDEPENDENT_AMBULATORY_CARE_PROVIDER_SITE_OTHER): Payer: Medicare Other | Admitting: Internal Medicine

## 2023-02-18 VITALS — BP 136/82 | HR 63 | Temp 97.6°F | Ht 61.0 in | Wt 148.6 lb

## 2023-02-18 DIAGNOSIS — N1831 Chronic kidney disease, stage 3a: Secondary | ICD-10-CM | POA: Diagnosis not present

## 2023-02-18 DIAGNOSIS — I10 Essential (primary) hypertension: Secondary | ICD-10-CM

## 2023-02-18 DIAGNOSIS — M5126 Other intervertebral disc displacement, lumbar region: Secondary | ICD-10-CM | POA: Diagnosis not present

## 2023-02-18 DIAGNOSIS — E1169 Type 2 diabetes mellitus with other specified complication: Secondary | ICD-10-CM | POA: Diagnosis not present

## 2023-02-18 DIAGNOSIS — E785 Hyperlipidemia, unspecified: Secondary | ICD-10-CM

## 2023-02-18 DIAGNOSIS — K760 Fatty (change of) liver, not elsewhere classified: Secondary | ICD-10-CM | POA: Diagnosis not present

## 2023-02-18 NOTE — Assessment & Plan Note (Signed)
She has resumed rosuvastatin after a 3 month break which was requested due to her concern that is was adversely affected her  glycemic control.   LDL has risen and A1c is unchanged.  LFTs have risen as well ;  she has resumed statin.   Lab Results  Component Value Date   CHOL 233 (H) 01/06/2023   HDL 42 (L) 01/06/2023   LDLCALC 143 (H) 01/06/2023   LDLDIRECT 80.0 10/04/2022   TRIG 315 (H) 01/06/2023   CHOLHDL 5.5 (H) 01/06/2023   Lab Results  Component Value Date   HGBA1C 7.1 (H) 01/06/2023

## 2023-02-18 NOTE — Assessment & Plan Note (Signed)
multifactorial  Due to DM,  HTN and NSAID use. Nephrology referral has been done.  Her last visit with Korapati was reviewed;  BP as [id number] systolic.  She is avoiding NSAIDs and taking an ARB. Advised to increase losartan to 100 mg daily

## 2023-02-18 NOTE — Progress Notes (Signed)
Subjective:  Patient ID: Sara Mills, female    DOB: [birth year]  Age: 72 y.o. MRN: [id number]  CC: The primary encounter diagnosis was Low back pain due to displacement of intervertebral disc. Diagnoses of Hyperlipidemia due to type 2 diabetes mellitus ([place]), Stage 3a chronic kidney disease ([place]), Hepatic steatosis, and Essential hypertension were also pertinent to this visit.   HPI Sara Mills presents for FOLLOW UP ON MULTIPLE ISSUES  Chief Complaint  Patient presents with   discuss lab results   1) ELEVATED LFT'S:  SHE HAS  a history of  FATTY LIVER suggested by  2020 ULTRASOUND. Over the summer she discontinued crestor and other medications after developing " PILL FATIGUE"  but continued telmisartan. Glipize dnd metformin    2) BACK PAIN .  Flared up after raking leaves this weekend.  Using USING TYLENOL600 mg daiy and gabapentin. Has tramadol but trying not to use it .  She is in pain   3) HTN :  she has been taking losartan  50 mg daily  and did not resume hctz due to concern for its effect on her kidneys.  She is willing to increase the losartan dose   Outpatient Medications Prior to Visit  Medication Sig Dispense Refill   ACCU-CHEK GUIDE test strip USE 1 STRIP TO CHECK BLOOD GLUCOSE ONCE A DAY 100 strip 1   gabapentin (NEURONTIN) 300 MG capsule Take 1 capsule (300 mg total) by mouth 2 (two) times daily. 120 capsule 1   glipiZIDE (GLUCOTROL) 5 MG tablet TAKE 1/2 TABLET (2.5 MG TOTAL) BY MOUTH TWICE DAILY BEFORE A MEAL 90 tablet 1   losartan (COZAAR) 50 MG tablet Take 1 tablet (50 mg total) by mouth daily. 90 tablet 1   metFORMIN (GLUCOPHAGE) 1000 MG tablet TAKE 1 TABLET (1,000 MG TOTAL) BY MOUTH TWICE A DAY WITH FOOD 180 tablet 1   tiZANidine (ZANAFLEX) 4 MG tablet Take 1 tablet (4 mg total) by mouth every 6 (six) hours as needed for muscle spasms. (Patient not taking: Reported on 02/18/2023) 90 tablet 1   traMADol (ULTRAM) 50 MG tablet Take 1 tablet (50 mg total) by mouth 2  (two) times daily. (Patient not taking: Reported on 10/04/2022) 60 tablet 5   hydrochlorothiazide (MICROZIDE) 12.5 MG capsule Take 1 capsule (12.5 mg total) by mouth daily. (Patient not taking: Reported on 02/18/2023) 90 capsule 0   No facility-administered medications prior to visit.    Review of Systems;  Patient denies headache, fevers, malaise, unintentional weight loss, skin rash, eye pain, sinus congestion and sinus pain, sore throat, dysphagia,  hemoptysis , cough, dyspnea, wheezing, chest pain, palpitations, orthopnea, edema, abdominal pain, nausea, melena, diarrhea, constipation, flank pain, dysuria, hematuria, urinary  Frequency, nocturia, numbness, tingling, seizures,  Focal weakness, Loss of consciousness,  Tremor, insomnia, depression, anxiety, and suicidal ideation.      Objective:  BP 136/82   Pulse 63   Temp 97.6 F (36.4 C) (Oral)   Ht '5\' 1"'$  (1.549 m)   Wt 148 lb 9.6 oz (67.4 kg)   SpO2 97%   BMI 28.08 kg/m   BP Readings from Last 3 Encounters:  02/18/23 136/82  01/06/23 138/84  12/03/22 (!) 144/92    Wt Readings from Last 3 Encounters:  02/18/23 148 lb 9.6 oz (67.4 kg)  01/06/23 148 lb (67.1 kg)  12/03/22 148 lb (67.1 kg)    Physical Exam Vitals reviewed.  Constitutional:      General: She is not in acute  distress.    Appearance: Normal appearance. She is normal weight. She is not ill-appearing, toxic-appearing or diaphoretic.  HENT:     Head: Normocephalic.  Eyes:     General: No scleral icterus.       Right eye: No discharge.        Left eye: No discharge.     Conjunctiva/sclera: Conjunctivae normal.  Cardiovascular:     Rate and Rhythm: Normal rate and regular rhythm.     Heart sounds: Normal heart sounds.  Pulmonary:     Effort: Pulmonary effort is normal. No respiratory distress.     Breath sounds: Normal breath sounds.  Musculoskeletal:        General: Normal range of motion.  Skin:    General: Skin is warm and dry.  Neurological:      General: No focal deficit present.     Mental Status: She is alert and oriented to person, place, and time. Mental status is at baseline.  Psychiatric:        Mood and Affect: Mood normal.        Behavior: Behavior normal.        Thought Content: Thought content normal.        Judgment: Judgment normal.     Lab Results  Component Value Date   HGBA1C 7.1 (H) 01/06/2023   HGBA1C 7.2 (H) 10/04/2022   HGBA1C 7.2 (H) 04/04/2022    Lab Results  Component Value Date   CREATININE 0.98 01/06/2023   CREATININE 0.90 10/04/2022   CREATININE 1.30 (H) 06/01/2022    Lab Results  Component Value Date   WBC 6.4 10/04/2022   HGB 12.0 10/04/2022   HCT 36.2 10/04/2022   PLT 117.0 (L) 10/04/2022   GLUCOSE 82 01/06/2023   CHOL 233 (H) 01/06/2023   TRIG 315 (H) 01/06/2023   HDL 42 (L) 01/06/2023   LDLDIRECT 80.0 10/04/2022   LDLCALC 143 (H) 01/06/2023   ALT 76 (H) 01/06/2023   AST 55 (H) 01/06/2023   NA 140 01/06/2023   K 4.1 01/06/2023   CL 101 01/06/2023   CREATININE 0.98 01/06/2023   BUN 14 01/06/2023   CO2 28 01/06/2023   TSH 1.86 10/04/2022   INR 1.1 06/02/2022   HGBA1C 7.1 (H) 01/06/2023   MICROALBUR 0.9 01/06/2023    US RENAL  Result Date: 07/30/2022 CLINICAL DATA:  Chronic renal disease EXAM: RENAL / URINARY TRACT ULTRASOUND COMPLETE COMPARISON:  None Available. FINDINGS: Right Kidney: Renal measurements: 9.6 x 5.3 x 5.0 cm = volume: 134 mL. The right renal pelvis is fluid-filled and mildly prominent. No caliectasis. Left Kidney: Renal measurements: 9.5 x 4.6 x 4.2 cm = volume: 96 mL. Echogenicity within normal limits. No mass or hydronephrosis visualized. Bladder: Appears normal for degree of bladder distention. Other: None. IMPRESSION: 1. The right renal pelvis is fluid-filled and mildly prominent without caliectasis. The kidneys and bladder are otherwise normal. Electronically Signed   By: Dorise Bullion III M.D.   On: 07/30/2022 11:54    Assessment & Plan:  .Low back  pain due to displacement of intervertebral disc Assessment & Plan: AGGRAVATED RECENTLY BY RAKING LEAVES.  STARTS WATER THERAPY NEXT WEEK AT [facility] .  She has had ESI by [facility] last summer which did not help. Encouraged to use tylenol 1000 mg daily (fatty liver limitations). And add   Gabapentin for radiculitis and tramadol  for pain not relived by tylenol    Hyperlipidemia due to type 2 diabetes  mellitus ([place]) Assessment & Plan: She has resumed rosuvastatin after a 3 month break which was requested due to her concern that is was adversely affected her  glycemic control.   LDL has risen and A1c is unchanged.  LFTs have risen as well ;  she has resumed statin.   Lab Results  Component Value Date   CHOL 233 (H) 01/06/2023   HDL 42 (L) 01/06/2023   LDLCALC 143 (H) 01/06/2023   LDLDIRECT 80.0 10/04/2022   TRIG 315 (H) 01/06/2023   CHOLHDL 5.5 (H) 01/06/2023   Lab Results  Component Value Date   HGBA1C 7.1 (H) 01/06/2023     Orders: -     Comprehensive metabolic panel -     LDL cholesterol, direct -     Lipid panel  Stage 3a chronic kidney disease ([facility]) Assessment & Plan:  multifactorial  Due to DM,  HTN and NSAID use. Nephrology referral has been done.  Her last visit with Korapati was reviewed;  BP as [id number] systolic.  She is avoiding NSAIDs and taking an ARB. Advised to increase losartan to 100 mg daily      Hepatic steatosis Assessment & Plan: Noted on 2020 ultrasound,  Taking metformin . She has resumed rosuvastatin AFTER LAST set of labs in late January.  Repeat labs needed   Lab Results  Component Value Date   ALT 76 (H) 01/06/2023   AST 55 (H) 01/06/2023   ALKPHOS 93 01/06/2023   BILITOT 0.6 01/06/2023      Essential hypertension Assessment & Plan: Increase losartan to 100 mg daily for elevated readings       Follow-up: No follow-ups on file.   Crecencio Mc, MD

## 2023-02-18 NOTE — Assessment & Plan Note (Signed)
Noted on 2020 ultrasound,  Taking metformin . She has resumed rosuvastatin AFTER LAST set of labs in late January.  Repeat labs needed   Lab Results  Component Value Date   ALT 76 (H) 01/06/2023   AST 55 (H) 01/06/2023   ALKPHOS 93 01/06/2023   BILITOT 0.6 01/06/2023

## 2023-02-18 NOTE — Assessment & Plan Note (Addendum)
AGGRAVATED RECENTLY BY RAKING LEAVES.  STARTS WATER THERAPY NEXT WEEK AT [facility] .  She has had ESI by [facility] last summer which did not help. Encouraged to use tylenol 1000 mg daily (fatty liver limitations). And add   Gabapentin for radiculitis and tramadol  for pain not relived by tylenol

## 2023-02-18 NOTE — Assessment & Plan Note (Signed)
Increase losartan to 100 mg daily for elevated readings

## 2023-02-18 NOTE — Patient Instructions (Signed)
INCREASE LOSARTAN DOSE TO 100 MG DAILY FOR BLOOD PRESSURE  GOAL IS 130/80 OR LESS  (BUT NOT BELOW 100/60)   FOR BACK PAIN ,   IT IS OK  TO USE 1000 MG TYLENOL DAILY,  ADD TRAMADOL IF NEEDED

## 2023-02-19 LAB — COMPREHENSIVE METABOLIC PANEL
ALT: 26 U/L (ref 0–35)
AST: 32 U/L (ref 0–37)
Albumin: 4.5 g/dL (ref 3.5–5.2)
Alkaline Phosphatase: 86 U/L (ref 39–117)
BUN: 20 mg/dL (ref 6–23)
CO2: 25 mEq/L (ref 19–32)
Calcium: 9.7 mg/dL (ref 8.4–10.5)
Chloride: 104 mEq/L (ref 96–112)
Creatinine, Ser: 1.04 mg/dL (ref 0.40–1.20)
GFR: 54.81 mL/min — ABNORMAL LOW (ref >60.00)
Glucose, Bld: 65 mg/dL — ABNORMAL LOW (ref 70–99)
Potassium: 3.5 mEq/L (ref 3.5–5.1)
Sodium: 139 mEq/L (ref 135–145)
Total Bilirubin: 0.5 mg/dL (ref 0.2–1.2)
Total Protein: 7.3 g/dL (ref 6.0–8.3)

## 2023-02-19 LAB — LIPID PANEL
Cholesterol: 165 mg/dL (ref 0–200)
HDL: 47.4 mg/dL (ref >39.00)
NonHDL: 117.11
Total CHOL/HDL Ratio: 3
Triglycerides: 251 mg/dL — ABNORMAL HIGH (ref 0.0–149.0)
VLDL: 50.2 mg/dL — ABNORMAL HIGH (ref 0.0–40.0)

## 2023-02-19 LAB — LDL CHOLESTEROL, DIRECT: Direct LDL: 86 mg/dL

## 2023-02-20 ENCOUNTER — Encounter (HOSPITAL_BASED_OUTPATIENT_CLINIC_OR_DEPARTMENT_OTHER): Payer: Self-pay | Admitting: Physical Therapy

## 2023-02-20 ENCOUNTER — Ambulatory Visit (HOSPITAL_BASED_OUTPATIENT_CLINIC_OR_DEPARTMENT_OTHER): Payer: Medicare Other | Admitting: Physical Therapy

## 2023-02-20 DIAGNOSIS — M5459 Other low back pain: Secondary | ICD-10-CM

## 2023-02-20 DIAGNOSIS — M6281 Muscle weakness (generalized): Secondary | ICD-10-CM | POA: Diagnosis not present

## 2023-02-20 DIAGNOSIS — M5416 Radiculopathy, lumbar region: Secondary | ICD-10-CM | POA: Diagnosis not present

## 2023-02-20 NOTE — Therapy (Signed)
OUTPATIENT PHYSICAL THERAPY THORACOLUMBAR TREATMENT   Patient Name: Sara Mills MRN: [id number] DOB:[birth year], 72 y.o., female Today's Date: 02/20/2023  END OF SESSION:  PT End of Session - 02/20/23 1030     Visit Number 2    Number of Visits 12    Authorization Type Mcr    Progress Note Due on Visit 10    PT Start Time 1031    PT Stop Time 1110    PT Time Calculation (min) 39 min    Behavior During Therapy WFL for tasks assessed/performed             Past Medical History:  Diagnosis Date   Angioedema 06/08/2017   Diabetes mellitus    GERD (gastroesophageal reflux disease)    Hyperlipidemia    Hypertension    Past Surgical History:  Procedure Laterality Date   ABDOMINAL HYSTERECTOMY  1995   and BSO   APPENDECTOMY     BILATERAL OOPHORECTOMY     BREAST BIOPSY Right 02/15/2021   right breast calcs, x marker, BENIGN BREAST TISSUE WITH FIBROADENOMATOID CHANGES ANDASSOCIATED CALCIFICATIONS. - NEGATIVE FOR ATYPIA AND MALIGNANCY   BUNIONECTOMY     CARPAL TUNNEL RELEASE Right    CESAREAN SECTION  1977 and 1978   Patient Active Problem List   Diagnosis Date Noted   Stage 3a chronic kidney disease ([place]) 05/20/2022   Tinnitus aurium, left 04/04/2022   Breast calcifications on mammogram 02/03/2021   History of pneumonia 10/05/2020   Viral URI 05/19/2020   Herpes simplex 05/20/2019   Angiotensin converting enzyme inhibitor-aggravated angioedema 02/20/2019   Hepatic steatosis 02/20/2019   Cervical spondylosis with radiculopathy 11/19/2018   Low back pain due to displacement of intervertebral disc 09/29/2018   Long-term use of high-risk medication 12/20/2017   Sacro-iliac pain 06/08/2017   Encounter for long-term (current) use of other high-risk medications 09/15/2015   Encounter for preventive health examination 01/12/2014   Diabetes mellitus type 2 in obese ([place]) 08/26/2012   Overweight (BMI 25.0-29.9) 03/22/2012   Premature menopause on hormone replacement therapy  03/20/2012   Essential hypertension    THYROID NODULE 08/06/2007   COLONIC POLYPS 07/27/2007   Hyperlipidemia due to type 2 diabetes mellitus ([place]) 07/27/2007   GERD 12/09/2004    PCP: Crecencio Mc, MD   REFERRING PROVIDER: Melina Schools, MD   REFERRING DIAG: M54.16 (ICD-10-CM) - Radiculopathy, lumbar region   Rationale for Evaluation and Treatment: Rehabilitation  THERAPY DIAG:  Other low back pain  Muscle weakness (generalized)  ONSET DATE: >5 yrs  SUBJECTIVE:  SUBJECTIVE STATEMENT: Pt reports her pain comes and goes.  Today is a good day, I'm not in any pain.    PERTINENT HISTORY:   As per Dr Derrel Nip 01/06/23 recent episode of back pain. Treated by TW dec 26. Resolved. Still has tramadol leftover. Still waiting for aquatic therapy ordered by orthopedics for place in [place]:  Are you having pain? no: NPRS scale: 0/10 Pain location: iliac crest right sided Pain description:  Aggravating factors: getting up from a chair or OOB, moving in bed Relieving factors: sitting still or up moving around  PRECAUTIONS: Back  WEIGHT BEARING RESTRICTIONS: No  FALLS:  Has patient fallen in last 6 months? No  LIVING ENVIRONMENT: Lives with: lives with their spouse Lives in: House/apartment Stairs: Yes: Internal: 16 steps; on right going up Has following equipment at home: None  OCCUPATION: retired  PLOF: Independent  PATIENT GOALS: Build up muscles, move with less pain, return to walking  NEXT MD VISIT: as needed  OBJECTIVE:   DIAGNOSTIC FINDINGS:  MRI cervical spine 4/22 to be sent over by dtr from [facility] No recent dx for LB  PATIENT SURVEYS:  FOTO Primary score:57% with goal of 61% visit 11  SCREENING FOR RED FLAGS: none  COGNITION: Overall cognitive status: Within  functional limits for tasks assessed     SENSATION: Pt reports no radicular pain. In past she has after cyct removed no more pain  MUSCLE LENGTH: Hamstrings: Right 70 deg; Left 80 deg   POSTURE: No Significant postural limitations but guarded/stiff  PALPATION: Tight paraspinals throughout thoracic spine.  TTP about    Cervical ROM: WFL but pain at end ranges   LUMBAR ROM:    Pain and end range AROM eval  Flexion full  Extension full  Right lateral flexion 50%  Left lateral flexion full  Right rotation full  Left rotation full   (Blank rows = not tested)  LOWER EXTREMITY ROM:     WFL  LOWER EXTREMITY MMT:    Hip strength: grossly 4-/5  LUMBAR SPECIAL TESTS:  Straight leg raise test: Negative, Slump test: Negative, and Thomas test: Negative  FUNCTIONAL TESTS:  5 times sit to stand: 16.60 Timed up and go (TUG): 9.26 4 stage balance test 16/16  GAIT: Distance walked: >592f Assistive device utilized: None Level of assistance: Complete Independence Comments: slow with STS transfer, initial gait antalgic, cadence slowed shorten step length after 20 ft gait normalizes  TODAY'S TREATMENT:                                                                                                                              Pt seen for aquatic therapy today.  Treatment took place in water 3.5-4.75 ft [facility] Temp of water was 91.  Pt entered/exited the pool via stairs independently with bilat rail. * intro to aquatics  * unsupported walking forward/backwards,  and side stepping -  multiple laps  * high knee marching forward/backwards * TrA set with short blue noodle pull down to thighs x 10; cues to soften knees and slow speed * TrA set with tricep push down (facing hot tub feels better on back) x 10 * side step with rainbow hand floats with arm addct/abdct - 2 laps * hip hinge with arm reach on yellow noodle x 4, cues for form * STS at bench  in water with feet on blue step x 10 with cues for hip hinge and forward reach * return to walking forward/backward * holding wall: heel raises x 10;  relaxed squats x 3  Pt requires the buoyancy and hydrostatic pressure of water for support, and to offload joints by unweighting joint load by at least 50 % in navel deep water and by at least 75-80% in chest to neck deep water.  Viscosity of the water is needed for resistance of strengthening. Water current perturbations provides challenge to standing balance requiring increased core activation.     PATIENT EDUCATION:  Education details: Primary school teacher educated: patient Education method: Explanation Education comprehension: verbalized understanding  HOME EXERCISE PROGRAM: Aquatic tba Cervical TBA  ASSESSMENT:  CLINICAL IMPRESSION: Pt is confident in aquatic setting and able to take direction from therapist on deck (she has an outdoor pool at home).   She tolerated aquatic exercises well, with only mild increase in back pain with forward hip hinge with forward arm reach.  Discussed expectations following session (ie: DOMS and need for self care). Goals are ongoing.  Pt will benefit from continued skilled PT intervention to improved functional mobility and safety with less pain.  OBJECTIVE IMPAIRMENTS: decreased activity tolerance, decreased strength, postural dysfunction, and pain.   ACTIVITY LIMITATIONS: sleeping, transfers, and caring for others  PARTICIPATION LIMITATIONS: driving, shopping, community activity, and yard work  PERSONAL FACTORS: 1-2 comorbidities: kidney dysfunction/ability to take meds; cervical through lumbar spine dysfunction  are also affecting patient's functional outcome.   REHAB POTENTIAL: Good  CLINICAL DECISION MAKING: Evolving/moderate complexity  EVALUATION COMPLEXITY: Moderate   GOALS: Goals reviewed with patient? No  SHORT TERM GOALS: Target date: 03/07/23  Pt will tolerate full aquatic  sessions consistently without increase in pain and with improving function to demonstrate good toleration and effectiveness of intervention.   Baseline: Goal status: INITIAL  2.  Pt will complete 10 consecutive STS from 4th step (minimal submersion) without increase in pain. Baseline: pain with STS Goal status: INITIAL  3.  Pt will report decreased worst pain to <5/10 for improved toleration to functional mobility Baseline: 6/10 Goal status: INITIAL  4.  Pt will be indep and compliant with cervical ROM HEP Baseline:none  Goal status: INITIAL  5.  Pt will be indep with proper mechanics with sup<>sit Baseline: extreme pain/questionable technique Goal status: INITIAL   LONG TERM GOALS: Target date: 03/27/23  Pt to meet stated Foto Goal 61 Baseline: 46% risk adjusted Goal status: INITIAL  2.  Pt will improve on 5 X STS test to <or=  12s  to demonstrate improving functional lower extremity strength, transitional movements, and balance  Baseline: 16.60 Goal status: INITIAL  3.  Pt will be indep with final HEP's (land and aquatic as appropriate) for continued management of condition  Baseline: none Goal status: INITIAL  4.  Pt will increase hip strength by up to 1 full grade to meet personal goal and improve mobility Baseline: see chart Goal status: INITIAL    PLAN:  PT FREQUENCY: 1-2x/week  PT DURATION: 6 weeks  PLANNED INTERVENTIONS: Therapeutic exercises, Therapeutic activity, Neuromuscular re-education, Balance training, Gait training, Patient/Family education, Self Care, Joint mobilization, Joint manipulation, Stair training, Orthotic/Fit training, DME instructions, Aquatic Therapy, Dry Needling, Electrical stimulation, Moist heat, Taping, Traction, Ultrasound, Ionotophoresis '4mg'$ /ml Dexamethasone, Manual therapy, and Re-evaluation.  PLAN FOR NEXT SESSION: aquatic for core strength to improved stability and LE strength for improved mobility. STS transfers, bed mobility.  HEP  Kerin Perna, [place] 02/20/23 11:23 AM [facility] [address] [place], [place], [address] Phone: [phone]   Fax:  [phone]

## 2023-02-25 ENCOUNTER — Ambulatory Visit (HOSPITAL_BASED_OUTPATIENT_CLINIC_OR_DEPARTMENT_OTHER): Payer: Medicare Other | Admitting: Physical Therapy

## 2023-02-25 DIAGNOSIS — M6281 Muscle weakness (generalized): Secondary | ICD-10-CM

## 2023-02-25 DIAGNOSIS — M5416 Radiculopathy, lumbar region: Secondary | ICD-10-CM | POA: Diagnosis not present

## 2023-02-25 DIAGNOSIS — M5459 Other low back pain: Secondary | ICD-10-CM | POA: Diagnosis not present

## 2023-02-25 NOTE — Therapy (Signed)
OUTPATIENT PHYSICAL THERAPY THORACOLUMBAR TREATMENT   Patient Name: Sara Mills MRN: [id number] DOB:1951/01/19, 72 y.o., female Today's Date: 02/25/2023  END OF SESSION:  PT End of Session - 02/25/23 0820     Visit Number 3    Number of Visits 12    Date for PT Re-Evaluation 03/27/23    Authorization Type MCR    Progress Note Due on Visit 10    PT Start Time 0815    PT Stop Time 0903    PT Time Calculation (min) 48 min    Activity Tolerance Patient tolerated treatment well    Behavior During Therapy [facility] for tasks assessed/performed             Past Medical History:  Diagnosis Date   Angioedema 06/08/2017   Diabetes mellitus    GERD (gastroesophageal reflux disease)    Hyperlipidemia    Hypertension    Past Surgical History:  Procedure Laterality Date   ABDOMINAL HYSTERECTOMY  1995   and BSO   APPENDECTOMY     BILATERAL OOPHORECTOMY     BREAST BIOPSY Right 02/15/2021   right breast calcs, x marker, BENIGN BREAST TISSUE WITH FIBROADENOMATOID CHANGES ANDASSOCIATED CALCIFICATIONS. - NEGATIVE FOR ATYPIA AND MALIGNANCY   BUNIONECTOMY     CARPAL TUNNEL RELEASE Right    CESAREAN SECTION  1977 and 1978   Patient Active Problem List   Diagnosis Date Noted   Stage 3a chronic kidney disease ([place]) 05/20/2022   Tinnitus aurium, left 04/04/2022   Breast calcifications on mammogram 02/03/2021   History of pneumonia 10/05/2020   Viral URI 05/19/2020   Herpes simplex 05/20/2019   Angiotensin converting enzyme inhibitor-aggravated angioedema 02/20/2019   Hepatic steatosis 02/20/2019   Cervical spondylosis with radiculopathy 11/19/2018   Low back pain due to displacement of intervertebral disc 09/29/2018   Long-term use of high-risk medication 12/20/2017   Sacro-iliac pain 06/08/2017   Encounter for long-term (current) use of other high-risk medications 09/15/2015   Encounter for preventive health examination 01/12/2014   Diabetes mellitus type 2 in obese ([place]) 08/26/2012    Overweight (BMI 25.0-29.9) 03/22/2012   Premature menopause on hormone replacement therapy 03/20/2012   Essential hypertension    THYROID NODULE 08/06/2007   COLONIC POLYPS 07/27/2007   Hyperlipidemia due to type 2 diabetes mellitus ([place]) 07/27/2007   GERD 12/09/2004    PCP: Crecencio Mc, MD   REFERRING PROVIDER: Melina Schools, MD   REFERRING DIAG: M54.16 (ICD-10-CM) - Radiculopathy, lumbar region   Rationale for Evaluation and Treatment: Rehabilitation  THERAPY DIAG:  Other low back pain  Muscle weakness (generalized)  ONSET DATE: >5 yrs  SUBJECTIVE:  SUBJECTIVE STATEMENT: Pt reports she was tired after last session; went to sleep at home afterwards.  She hung wall paper this weekend and she reports some soreness (knees/Lt shoulder) over the weekend from that.  She reports she has trouble sleeping on her Lt shoulder.    PERTINENT HISTORY:   As per Dr Derrel Nip 01/06/23 recent episode of back pain. Treated by TW dec 26. Resolved. Still has tramadol leftover. Still waiting for aquatic therapy ordered by orthopedics for place in [place]:  Are you having pain? no: NPRS scale: 0/10 Pain location: iliac crest right sided Pain description:  Aggravating factors: getting up from a chair or OOB, moving in bed Relieving factors: sitting still or up moving around  PRECAUTIONS: Back  WEIGHT BEARING RESTRICTIONS: No  FALLS:  Has patient fallen in last 6 months? No  LIVING ENVIRONMENT: Lives with: lives with their spouse Lives in: House/apartment Stairs: Yes: Internal: 16 steps; on right going up Has following equipment at home: None  OCCUPATION: retired  PLOF: Independent  PATIENT GOALS: Build up muscles, move with less pain, return to walking  NEXT MD VISIT: as needed  OBJECTIVE:    DIAGNOSTIC FINDINGS:  MRI cervical spine 4/22 to be sent over by dtr [facility] No recent dx for LB  PATIENT SURVEYS:  FOTO Primary score:57% with goal of 61% visit 11  SCREENING FOR RED FLAGS: none  COGNITION: Overall cognitive status: Within functional limits for tasks assessed     SENSATION: Pt reports no radicular pain. In past she has after cyct removed no more pain  MUSCLE LENGTH: Hamstrings: Right 70 deg; Left 80 deg   POSTURE: No Significant postural limitations but guarded/stiff  PALPATION: Tight paraspinals throughout thoracic spine.  TTP about    Cervical ROM: WFL but pain at end ranges   LUMBAR ROM:    Pain and end range AROM eval  Flexion full  Extension full  Right lateral flexion 50%  Left lateral flexion full  Right rotation full  Left rotation full   (Blank rows = not tested)  LOWER EXTREMITY ROM:     WFL  LOWER EXTREMITY MMT:    Hip strength: grossly 4-/5  LUMBAR SPECIAL TESTS:  Straight leg raise test: Negative, Slump test: Negative, and Thomas test: Negative  FUNCTIONAL TESTS:  5 times sit to stand: 16.60 Timed up and go (TUG): 9.26 4 stage balance test 16/16  GAIT: Distance walked: >578ft Assistive device utilized: None Level of assistance: Complete Independence Comments: slow with STS transfer, initial gait antalgic, cadence slowed shorten step length after 20 ft gait normalizes  TODAY'S TREATMENT:                                                                                                                              Pt seen for aquatic therapy today.  Treatment took place in water 3.5-4 ft in depth at the [facility]. Temp of water was  91.  Pt entered/exited the pool via stairs independently with bilat rail.  * unsupported walking forward/backwards, multiple laps  * side step with rainbow hand floats with arm addct/abdct - 2 laps * holding rainbow hand floats:  wide stance / staggered stance with  bilat shoulder horiz abdct/addct;  tricep push downs * high knee marching forward/backwards * TrA set with short blue noodle pull down to thighs x 15 * Hold wall:  hip openers and crosses; heel raises x10;  hip abdct/ addct x 10; Leg swings into hip flex/ext x 10; wall push ups/push off x 10 each *  return to walking forward/backward with reciprocal arm swing * standard stance with reciprocal arm swing with resistance bells (long/slow and short/quick) with TrA set. Bilat shoulder horiz abdct/addct with resistance bells * standing hamstring stretch x 20s x 2 (foot on 2nd step);  seated glute stretch on 4th step 15s x 2;  R/L pec stretch with hand on wall x 20s each;  Rt post shoulder stretch (hugging arm to chest) x 10 sec x 2  Pt requires the buoyancy and hydrostatic pressure of water for support, and to offload joints by unweighting joint load by at least 50 % in navel deep water and by at least 75-80% in chest to neck deep water.  Viscosity of the water is needed for resistance of strengthening. Water current perturbations provides challenge to standing balance requiring increased core activation.     PATIENT EDUCATION:  Education details: Primary school teacher educated: patient Education method: Explanation Education comprehension: verbalized understanding  HOME EXERCISE PROGRAM: Aquatic tba  02/25/23: Verbally given - seated or standing hamstring stretch 15s x 2 each LE, seated glute/piriformis stretch 15s x 2 each LE, R/L pec stretch holding wall with elbow straight (abdct ~90 deg) x 15s each  ASSESSMENT:  CLINICAL IMPRESSION: Pt tolerated session well, without increase in pain.  Pt shown stretches for LE,hip, and shoulder that can be completed on land 2x/day for reduction of tightness and pain.  Goals are ongoing.  Pt will benefit from continued skilled PT intervention to improved functional mobility and safety with less pain.  Therapist to check STG next visit.   OBJECTIVE  IMPAIRMENTS: decreased activity tolerance, decreased strength, postural dysfunction, and pain.   ACTIVITY LIMITATIONS: sleeping, transfers, and caring for others  PARTICIPATION LIMITATIONS: driving, shopping, community activity, and yard work  PERSONAL FACTORS: 1-2 comorbidities: kidney dysfunction/ability to take meds; cervical through lumbar spine dysfunction  are also affecting patient's functional outcome.   REHAB POTENTIAL: Good  CLINICAL DECISION MAKING: Evolving/moderate complexity  EVALUATION COMPLEXITY: Moderate   GOALS: Goals reviewed with patient? No  SHORT TERM GOALS: Target date: 03/07/23  Pt will tolerate full aquatic sessions consistently without increase in pain and with improving function to demonstrate good toleration and effectiveness of intervention.   Baseline: Goal status: INITIAL  2.  Pt will complete 10 consecutive STS from 4th step (minimal submersion) without increase in pain. Baseline: pain with STS Goal status: INITIAL  3.  Pt will report decreased worst pain to <5/10 for improved toleration to functional mobility Baseline: 6/10 Goal status: INITIAL  4.  Pt will be indep and compliant with cervical ROM HEP Baseline:none  Goal status: INITIAL  5.  Pt will be indep with proper mechanics with sup<>sit Baseline: extreme pain/questionable technique Goal status: INITIAL   LONG TERM GOALS: Target date: 03/27/23  Pt to meet stated Foto Goal 61 Baseline: 46% risk adjusted Goal status: INITIAL  2.  Pt  will improve on 5 X STS test to <or=  12s  to demonstrate improving functional lower extremity strength, transitional movements, and balance  Baseline: 16.60 Goal status: INITIAL  3.  Pt will be indep with final HEP's (land and aquatic as appropriate) for continued management of condition  Baseline: none Goal status: INITIAL  4.  Pt will increase hip strength by up to 1 full grade to meet personal goal and improve mobility Baseline: see chart Goal  status: INITIAL    PLAN:  PT FREQUENCY: 1-2x/week  PT DURATION: 6 weeks  PLANNED INTERVENTIONS: Therapeutic exercises, Therapeutic activity, Neuromuscular re-education, Balance training, Gait training, Patient/Family education, Self Care, Joint mobilization, Joint manipulation, Stair training, Orthotic/Fit training, DME instructions, Aquatic Therapy, Dry Needling, Electrical stimulation, Moist heat, Taping, Traction, Ultrasound, Ionotophoresis 4mg /ml Dexamethasone, Manual therapy, and Re-evaluation.  PLAN FOR NEXT SESSION: aquatic for core strength to improved stability and LE strength for improved mobility. STS transfers, bed mobility. HEP  Kerin Perna, [place] 02/25/23 9:13 AM [facility] [address] [place], [place], [address] Phone: [phone]   Fax:  [phone]

## 2023-03-09 ENCOUNTER — Other Ambulatory Visit: Payer: Self-pay | Admitting: Family

## 2023-03-12 ENCOUNTER — Encounter (HOSPITAL_BASED_OUTPATIENT_CLINIC_OR_DEPARTMENT_OTHER): Payer: Self-pay | Admitting: Physical Therapy

## 2023-03-12 ENCOUNTER — Ambulatory Visit (HOSPITAL_BASED_OUTPATIENT_CLINIC_OR_DEPARTMENT_OTHER): Payer: Medicare Other | Attending: Orthopedic Surgery | Admitting: Physical Therapy

## 2023-03-12 DIAGNOSIS — M6281 Muscle weakness (generalized): Secondary | ICD-10-CM | POA: Insufficient documentation

## 2023-03-12 DIAGNOSIS — M5459 Other low back pain: Secondary | ICD-10-CM | POA: Diagnosis not present

## 2023-03-12 NOTE — Therapy (Signed)
OUTPATIENT PHYSICAL THERAPY THORACOLUMBAR TREATMENT   Patient Name: Sara Mills MRN: [id number] DOB:May 13, 1951, 72 y.o., female Today's Date: 03/12/2023  END OF SESSION:  PT End of Session - 03/12/23 0939     Visit Number 4    Number of Visits 12    Date for PT Re-Evaluation 03/27/23    Authorization Type MCR    Progress Note Due on Visit 10    PT Start Time 0935    PT Stop Time 1018    PT Time Calculation (min) 43 min    Behavior During Therapy [facility] for tasks assessed/performed             Past Medical History:  Diagnosis Date   Angioedema 06/08/2017   Diabetes mellitus    GERD (gastroesophageal reflux disease)    Hyperlipidemia    Hypertension    Past Surgical History:  Procedure Laterality Date   ABDOMINAL HYSTERECTOMY  1995   and BSO   APPENDECTOMY     BILATERAL OOPHORECTOMY     BREAST BIOPSY Right 02/15/2021   right breast calcs, x marker, BENIGN BREAST TISSUE WITH FIBROADENOMATOID CHANGES ANDASSOCIATED CALCIFICATIONS. - NEGATIVE FOR ATYPIA AND MALIGNANCY   BUNIONECTOMY     CARPAL TUNNEL RELEASE Right    CESAREAN SECTION  1977 and 1978   Patient Active Problem List   Diagnosis Date Noted   Stage 3a chronic kidney disease 05/20/2022   Tinnitus aurium, left 04/04/2022   Breast calcifications on mammogram 02/03/2021   History of pneumonia 10/05/2020   Viral URI 05/19/2020   Herpes simplex 05/20/2019   Angiotensin converting enzyme inhibitor-aggravated angioedema 02/20/2019   Hepatic steatosis 02/20/2019   Cervical spondylosis with radiculopathy 11/19/2018   Low back pain due to displacement of intervertebral disc 09/29/2018   Long-term use of high-risk medication 12/20/2017   Sacro-iliac pain 06/08/2017   Encounter for long-term (current) use of other high-risk medications 09/15/2015   Encounter for preventive health examination 01/12/2014   Diabetes mellitus type 2 in obese 08/26/2012   Overweight (BMI 25.0-29.9) 03/22/2012   Premature menopause on  hormone replacement therapy 03/20/2012   Essential hypertension    THYROID NODULE 08/06/2007   COLONIC POLYPS 07/27/2007   Hyperlipidemia due to type 2 diabetes mellitus 07/27/2007   GERD 12/09/2004    PCP: Crecencio Mc, MD   REFERRING PROVIDER: Melina Schools, MD   REFERRING DIAG: M54.16 (ICD-10-CM) - Radiculopathy, lumbar region   Rationale for Evaluation and Treatment: Rehabilitation  THERAPY DIAG:  Other low back pain  Muscle weakness (generalized)  ONSET DATE: >5 yrs  SUBJECTIVE:  SUBJECTIVE STATEMENT: Pt reports she did not sleep well last night; neck was very painful.  She reports that she ran out of sleep medicine, and has contacted pharmacy for refill.  She used back brace with spreading mulch in yard, and this helped.     PERTINENT HISTORY:   As per Dr Derrel Nip 01/06/23 recent episode of back pain. Treated by TW dec 26. Resolved. Still has tramadol leftover. Still waiting for aquatic therapy ordered by orthopedics for place in [place]:  Are you having pain? yes: NPRS scale: 3-4/10 Pain location: Lt upper trap to occipital  Pain description:  Aggravating factors: getting up from a chair or OOB, moving in bed Relieving factors: sitting still or up moving around  PRECAUTIONS: Back  WEIGHT BEARING RESTRICTIONS: No  FALLS:  Has patient fallen in last 6 months? No  LIVING ENVIRONMENT: Lives with: lives with their spouse Lives in: House/apartment Stairs: Yes: Internal: 16 steps; on right going up Has following equipment at home: None  OCCUPATION: retired  PLOF: Independent  PATIENT GOALS: Build up muscles, move with less pain, return to walking  NEXT MD VISIT: as needed  OBJECTIVE:   DIAGNOSTIC FINDINGS:  MRI cervical spine 4/22 to be [facility] No recent dx for LB  PATIENT SURVEYS:  FOTO Primary score:57% with goal of 61% visit 11  SCREENING FOR RED FLAGS: none  COGNITION: Overall cognitive status: Within functional limits for tasks assessed     SENSATION: Pt reports no radicular pain. In past she has after cyct removed no more pain  MUSCLE LENGTH: Hamstrings: Right 70 deg; Left 80 deg   POSTURE: No Significant postural limitations but guarded/stiff  PALPATION: Tight paraspinals throughout thoracic spine.  TTP about    Cervical ROM: WFL but pain at end ranges   LUMBAR ROM:    Pain and end range AROM eval  Flexion full  Extension full  Right lateral flexion 50%  Left lateral flexion full  Right rotation full  Left rotation full   (Blank rows = not tested)  LOWER EXTREMITY ROM:     WFL  LOWER EXTREMITY MMT:    Hip strength: grossly 4-/5  LUMBAR SPECIAL TESTS:  Straight leg raise test: Negative, Slump test: Negative, and Thomas test: Negative  FUNCTIONAL TESTS:  5 times sit to stand: 16.60 Timed up and go (TUG): 9.26 4 stage balance test 16/16  GAIT: Distance walked: >521ft Assistive device utilized: None Level of assistance: Complete Independence Comments: slow with STS transfer, initial gait antalgic, cadence slowed shorten step length after 20 ft gait normalizes  TODAY'S TREATMENT:                                                                                                                              Pt seen for aquatic therapy today.  Treatment took place in water 3.5-4 ft in depth at the [facility]. Temp of water  was 91.  Pt entered/exited the pool via stairs independently with bilat rail.  * unsupported walking forward/backwards, multiple laps  * side step with rainbow hand floats with arm addct/abdct - 4 laps * holding rainbow hand floats:  wide stance / staggered stance with bilat shoulder horiz abdct/addct;  tricep push downs * Rt lateral neck stretch, with  chin tuck, for Lt upper trap stretch * "bow and arrow" with single leg step back x 10 each side, UE with rainbow hand floats * high knee marching forward/backwards with alternating arm row holding rainbow hand floats * TrA set with short blue noodle -> solid noodle pull down to thighs 10 x 2 * Hold wall:  Leg swings into hip flex/ext x 10 (Rt hip ext increased R LBP);  hip openers and crosses (increased Rt back pain with hip abdct) ;  *  return to walking forward/backward with reciprocal arm swing * wall push ups/push off x 10 each   Manual therapy:  IASTM to bilat upper trap, cervical paraspinals while pt seated on bench by pool, to decrease fascial restrictions, reduce pain and improve mobility.  Self care: pt instructed in self massage with ball to upper traps, lower back and hips. Pt returned demo with cues.    Pt requires the buoyancy and hydrostatic pressure of water for support, and to offload joints by unweighting joint load by at least 50 % in navel deep water and by at least 75-80% in chest to neck deep water.  Viscosity of the water is needed for resistance of strengthening. Water current perturbations provides challenge to standing balance requiring increased core activation.     PATIENT EDUCATION:  Education details: self care - massage with ball  Person educated: patient Education method: Explanation Education comprehension: verbalized understanding, returned demo   HOME EXERCISE PROGRAM: Aquatic tba  02/25/23: Verbally given - seated or standing hamstring stretch 15s x 2 each LE, seated glute/piriformis stretch 15s x 2 each LE, R/L pec stretch holding wall with elbow straight (abdct ~90 deg) x 15s each  ASSESSMENT:  CLINICAL IMPRESSION: Pt tolerated session well, however reported some increase in Rt low back pain with hip ext and hip abdct/knee flexion.  Pt given cues to dial back height of LE with exercise.   Trial of IASTM to upper traps;  palpable reduction of  tightness after completing.  Goals are ongoing.  Pt will benefit from continued skilled PT intervention to improved functional mobility and safety with less pain.  Therapist to check STG next visit.   OBJECTIVE IMPAIRMENTS: decreased activity tolerance, decreased strength, postural dysfunction, and pain.   ACTIVITY LIMITATIONS: sleeping, transfers, and caring for others  PARTICIPATION LIMITATIONS: driving, shopping, community activity, and yard work  PERSONAL FACTORS: 1-2 comorbidities: kidney dysfunction/ability to take meds; cervical through lumbar spine dysfunction  are also affecting patient's functional outcome.   REHAB POTENTIAL: Good  CLINICAL DECISION MAKING: Evolving/moderate complexity  EVALUATION COMPLEXITY: Moderate   GOALS: Goals reviewed with patient? No  SHORT TERM GOALS: Target date: 03/07/23  Pt will tolerate full aquatic sessions consistently without increase in pain and with improving function to demonstrate good toleration and effectiveness of intervention.   Baseline: Goal status: INITIAL  2.  Pt will complete 10 consecutive STS from 4th step (minimal submersion) without increase in pain. Baseline: pain with STS Goal status: INITIAL  3.  Pt will report decreased worst pain to <5/10 for improved toleration to functional mobility Baseline: 6/10 Goal status: INITIAL  4.  Pt  will be indep and compliant with cervical ROM HEP Baseline:none  Goal status: INITIAL  5.  Pt will be indep with proper mechanics with sup<>sit Baseline: extreme pain/questionable technique Goal status: INITIAL   LONG TERM GOALS: Target date: 03/27/23  Pt to meet stated Foto Goal 61 Baseline: 46% risk adjusted Goal status: INITIAL  2.  Pt will improve on 5 X STS test to <or=  12s  to demonstrate improving functional lower extremity strength, transitional movements, and balance  Baseline: 16.60 Goal status: INITIAL  3.  Pt will be indep with final HEP's (land and aquatic as  appropriate) for continued management of condition  Baseline: none Goal status: INITIAL  4.  Pt will increase hip strength by up to 1 full grade to meet personal goal and improve mobility Baseline: see chart Goal status: INITIAL    PLAN:  PT FREQUENCY: 1-2x/week  PT DURATION: 6 weeks  PLANNED INTERVENTIONS: Therapeutic exercises, Therapeutic activity, Neuromuscular re-education, Balance training, Gait training, Patient/Family education, Self Care, Joint mobilization, Joint manipulation, Stair training, Orthotic/Fit training, DME instructions, Aquatic Therapy, Dry Needling, Electrical stimulation, Moist heat, Taping, Traction, Ultrasound, Ionotophoresis 4mg /ml Dexamethasone, Manual therapy, and Re-evaluation.  PLAN FOR NEXT SESSION: aquatic for core strength to improved stability and LE strength for improved mobility. STS transfers, bed mobility. HEP  Kerin Perna, [place] 03/12/23 2:01 PM [facility] [address] [place], [place], [address] Phone: [phone]   Fax:  [phone]

## 2023-03-12 NOTE — Telephone Encounter (Signed)
Pt called and I read the message to her and she stated she has not had any zolpidem since last year and she had a bottle that she took sparingly but she is out now. Pt tried relaxium that the provider recommended

## 2023-03-12 NOTE — Telephone Encounter (Signed)
Zolpidem not in current med list  Last Refilled: 07/25/2022 Last OV: 02/18/2023 Next OV: 07/07/2023

## 2023-03-12 NOTE — Telephone Encounter (Signed)
Pt need a refill on rosuvastatin and zolpidem sent to [facility]

## 2023-03-12 NOTE — Telephone Encounter (Signed)
LMTCB

## 2023-03-14 ENCOUNTER — Ambulatory Visit (HOSPITAL_BASED_OUTPATIENT_CLINIC_OR_DEPARTMENT_OTHER): Payer: Medicare Other | Admitting: Physical Therapy

## 2023-03-17 ENCOUNTER — Encounter: Payer: Self-pay | Admitting: Internal Medicine

## 2023-03-18 ENCOUNTER — Encounter (HOSPITAL_BASED_OUTPATIENT_CLINIC_OR_DEPARTMENT_OTHER): Payer: Self-pay | Admitting: Physical Therapy

## 2023-03-18 ENCOUNTER — Ambulatory Visit (HOSPITAL_BASED_OUTPATIENT_CLINIC_OR_DEPARTMENT_OTHER): Payer: Medicare Other | Admitting: Physical Therapy

## 2023-03-18 DIAGNOSIS — M5459 Other low back pain: Secondary | ICD-10-CM

## 2023-03-18 DIAGNOSIS — M6281 Muscle weakness (generalized): Secondary | ICD-10-CM | POA: Diagnosis not present

## 2023-03-18 MED ORDER — ZOLPIDEM TARTRATE 10 MG PO TABS: 10.0000 mg | ORAL_TABLET | Freq: Every evening | ORAL | 5 refills | Status: DC | PRN

## 2023-03-18 NOTE — Therapy (Signed)
OUTPATIENT PHYSICAL THERAPY THORACOLUMBAR TREATMENT   Patient Name: Sara Mills MRN: [id number] DOB:11/06/1951, 72 y.o., female Today's Date: 03/18/2023  END OF SESSION:  PT End of Session - 03/18/23 1026     Visit Number 5    Number of Visits 12    Date for PT Re-Evaluation 03/27/23    Authorization Type MCR    Progress Note Due on Visit 10    PT Start Time 1020    PT Stop Time 1102    PT Time Calculation (min) 42 min             Past Medical History:  Diagnosis Date   Angioedema 06/08/2017   Diabetes mellitus    GERD (gastroesophageal reflux disease)    Hyperlipidemia    Hypertension    Past Surgical History:  Procedure Laterality Date   ABDOMINAL HYSTERECTOMY  1995   and BSO   APPENDECTOMY     BILATERAL OOPHORECTOMY     BREAST BIOPSY Right 02/15/2021   right breast calcs, x marker, BENIGN BREAST TISSUE WITH FIBROADENOMATOID CHANGES ANDASSOCIATED CALCIFICATIONS. - NEGATIVE FOR ATYPIA AND MALIGNANCY   BUNIONECTOMY     CARPAL TUNNEL RELEASE Right    CESAREAN SECTION  1977 and 1978   Patient Active Problem List   Diagnosis Date Noted   Stage 3a chronic kidney disease 05/20/2022   Tinnitus aurium, left 04/04/2022   Breast calcifications on mammogram 02/03/2021   History of pneumonia 10/05/2020   Viral URI 05/19/2020   Herpes simplex 05/20/2019   Angiotensin converting enzyme inhibitor-aggravated angioedema 02/20/2019   Hepatic steatosis 02/20/2019   Cervical spondylosis with radiculopathy 11/19/2018   Low back pain due to displacement of intervertebral disc 09/29/2018   Long-term use of high-risk medication 12/20/2017   Sacro-iliac pain 06/08/2017   Encounter for long-term (current) use of other high-risk medications 09/15/2015   Encounter for preventive health examination 01/12/2014   Diabetes mellitus type 2 in obese 08/26/2012   Overweight (BMI 25.0-29.9) 03/22/2012   Premature menopause on hormone replacement therapy 03/20/2012   Essential  hypertension    THYROID NODULE 08/06/2007   COLONIC POLYPS 07/27/2007   Hyperlipidemia due to type 2 diabetes mellitus 07/27/2007   GERD 12/09/2004    PCP: Sherlene Shamsullo, Teresa L, MD   REFERRING PROVIDER: Venita LickBrooks, Dahari, MD   REFERRING DIAG: M54.16 (ICD-10-CM) - Radiculopathy, lumbar region   Rationale for Evaluation and Treatment: Rehabilitation  THERAPY DIAG:  Other low back pain  Muscle weakness (generalized)  ONSET DATE: >5 yrs  SUBJECTIVE:  SUBJECTIVE STATEMENT: Pt reports she was out of town for a week.  She slept very well and had minimal pain; thinks she needs new mattress at home. She states she was sore from IASTM, but soreness didn't last; unsure if it helped. She hasn't tried self massage with ball yet.  She reports her Lt ankle has been bothering her on a daily basis ("if I step wrong" or with stairs).    PERTINENT HISTORY:   As per Dr Darrick Huntsman 01/06/23 recent episode of back pain. Treated by TW dec 26. Resolved. Still has tramadol leftover. Still waiting for aquatic therapy ordered by orthopedics for place in [place]   PAIN:  Are you having pain? yes: NPRS scale: 3/10 Pain location: bilat lower back Pain description: ache Aggravating factors: getting up from a chair or OOB, moving in bed Relieving factors: sitting still or up moving around  PRECAUTIONS: Back  WEIGHT BEARING RESTRICTIONS: No  FALLS:  Has patient fallen in last 6 months? No  LIVING ENVIRONMENT: Lives with: lives with their spouse Lives in: House/apartment Stairs: Yes: Internal: 16 steps; on right going up Has following equipment at home: None  OCCUPATION: retired  PLOF: Independent  PATIENT GOALS: Build up muscles, move with less pain, return to walking  NEXT MD VISIT: as needed  OBJECTIVE:   DIAGNOSTIC  FINDINGS:  MRI cervical spine 4/22 to be sent over by dr [facility] No recent dx for LB  PATIENT SURVEYS:  FOTO Primary score:57% with goal of 61% visit 11  SCREENING FOR RED FLAGS: none  COGNITION: Overall cognitive status: Within functional limits for tasks assessed     SENSATION: Pt reports no radicular pain. In past she has after cyct removed no more pain  MUSCLE LENGTH: Hamstrings: Right 70 deg; Left 80 deg   POSTURE: No Significant postural limitations but guarded/stiff  PALPATION: Tight paraspinals throughout thoracic spine.  TTP about    Cervical ROM: WFL but pain at end ranges   LUMBAR ROM:    Pain and end range AROM eval  Flexion full  Extension full  Right lateral flexion 50%  Left lateral flexion full  Right rotation full  Left rotation full   (Blank rows = not tested)  LOWER EXTREMITY ROM:     WFL  LOWER EXTREMITY MMT:    Hip strength: grossly 4-/5  LUMBAR SPECIAL TESTS:  Straight leg raise test: Negative, Slump test: Negative, and Thomas test: Negative  FUNCTIONAL TESTS:  5 times sit to stand: 16.60 Timed up and go (TUG): 9.26 4 stage balance test 16/16  GAIT: Distance walked: >528ft Assistive device utilized: None Level of assistance: Complete Independence Comments: slow with STS transfer, initial gait antalgic, cadence slowed shorten step length after 20 ft gait normalizes  TODAY'S TREATMENT:  Pt seen for aquatic therapy today.  Treatment took place in water 3.5-4 ft in depth at the [facility] pool. Temp of water was 91.  Pt entered/exited the pool via stairs independently with bilat rail.  * unsupported walking forward/backwards, multiple laps  * side step with rainbow hand floats with arm addct/abdct - 2 laps * "bow and arrow" with single leg step back x 12 each side, UE with rainbow hand  floats * high knee marching forward/backwards with alternating arm row holding rainbow hand floats * holding rainbow hand floats: Front/back toe taps 2 x 10; hip abdct/addct x 12;  wide stance / staggered stance with bilat shoulder horiz abdct/addct;  tricep push downs 10 * at wall:  soleus stretch  LLE - cues to hold stretch longer than 5s * walking forward with focus on heel/toe pattern  * wall push ups/push off x 10 each * TrA set with short blue noodle -> solid noodle pull down to thighs x12 * STS on 4th step, with cues for slow controlled descent with hip hinge, neutral head and forward arm reach  Pt requires the buoyancy and hydrostatic pressure of water for support, and to offload joints by unweighting joint load by at least 50 % in navel deep water and by at least 75-80% in chest to neck deep water.  Viscosity of the water is needed for resistance of strengthening. Water current perturbations provides challenge to standing balance requiring increased core activation.     PATIENT EDUCATION:  Education details: aquatic progressions/modifications   Person educated: patient Education method: Explanation Education comprehension: verbalized understanding, returned demo   HOME EXERCISE PROGRAM: Aquatic TBA  02/25/23: Verbally given - seated or standing hamstring stretch 15s x 2 each LE, seated glute/piriformis stretch 15s x 2 each LE, R/L pec stretch holding wall with elbow straight (abdct ~90 deg) x 15s each  ASSESSMENT:  CLINICAL IMPRESSION: Pt tolerated session well, however reported some increase in Rt low back pain with hip ext and hip abdct (side stepping).  Pt given cues to dial back height of LE and step length with exercise. She has pain with knee flexion, ankle DF; limited tolerance for soleus stretch - but would benefit from adding gentle version to HEP.   Pt has met STG#1,  and 5.   Pt will benefit from continued skilled PT intervention to improved functional mobility and  safety with less pain.  Therapist to test 5x STS next visit, if time.   Assess STG #4.   OBJECTIVE IMPAIRMENTS: decreased activity tolerance, decreased strength, postural dysfunction, and pain.   ACTIVITY LIMITATIONS: sleeping, transfers, and caring for others  PARTICIPATION LIMITATIONS: driving, shopping, community activity, and yard work  PERSONAL FACTORS: 1-2 comorbidities: kidney dysfunction/ability to take meds; cervical through lumbar spine dysfunction  are also affecting patient's functional outcome.   REHAB POTENTIAL: Good  CLINICAL DECISION MAKING: Evolving/moderate complexity  EVALUATION COMPLEXITY: Moderate   GOALS: Goals reviewed with patient? No  SHORT TERM GOALS: Target date: 03/07/23  Pt will tolerate full aquatic sessions consistently without increase in pain and with improving function to demonstrate good toleration and effectiveness of intervention.   Baseline: Goal status: MET - 03/18/23  2.  Pt will complete 10 consecutive STS from 4th step (minimal submersion) without increase in pain. Baseline: mild intermittent LBP during exercise-03/18/23 Goal status:PARTIALLY MET   3.  Pt will report decreased worst pain to <5/10 for improved toleration to functional mobility Baseline: 6/10 Goal status: ONGOING - 03/18/23, up to  8/10  4.  Pt will be indep and compliant with cervical ROM HEP Baseline:none  Goal status:ONGOING 03/18/23  5.  Pt will be indep with proper mechanics with sup<>sit Baseline: extreme pain/questionable technique Goal status:  MET - 03/18/23   LONG TERM GOALS: Target date: 03/27/23  Pt to meet stated Foto Goal 61 Baseline: 46% risk adjusted Goal status: INITIAL  2.  Pt will improve on 5 X STS test to <or=  12s  to demonstrate improving functional lower extremity strength, transitional movements, and balance  Baseline: 16.60 Goal status: INITIAL  3.  Pt will be indep with final HEP's (land and aquatic as appropriate) for continued management of  condition  Baseline: none Goal status: INITIAL  4.  Pt will increase hip strength by up to 1 full grade to meet personal goal and improve mobility Baseline: see chart Goal status: INITIAL    PLAN:  PT FREQUENCY: 1-2x/week  PT DURATION: 6 weeks  PLANNED INTERVENTIONS: Therapeutic exercises, Therapeutic activity, Neuromuscular re-education, Balance training, Gait training, Patient/Family education, Self Care, Joint mobilization, Joint manipulation, Stair training, Orthotic/Fit training, DME instructions, Aquatic Therapy, Dry Needling, Electrical stimulation, Moist heat, Taping, Traction, Ultrasound, Ionotophoresis 4mg /ml Dexamethasone, Manual therapy, and Re-evaluation.  PLAN FOR NEXT SESSION: aquatic for core strength to improved stability and LE strength for improved mobility. STS transfers, bed mobility. HEP  Mayer Camel, [place] 03/18/23 11:12 AM [facility] [address] [place], [place], [address] Phone: [phone]   Fax:  [phone]

## 2023-03-18 NOTE — Telephone Encounter (Signed)
Pt stated that she is still using the Zolpidem. She stated that she tried the Relxium but it did not help her.   Last refill: 07/25/2022 Last OV: 02/18/2023 Next OV: 07/07/2023

## 2023-03-19 ENCOUNTER — Ambulatory Visit
Admission: RE | Admit: 2023-03-19 | Discharge: 2023-03-19 | Disposition: A | Discharge status: Home / Self Care | Payer: Medicare Other | Source: Ambulatory Visit | Attending: Internal Medicine | Admitting: Internal Medicine

## 2023-03-19 DIAGNOSIS — Z1231 Encounter for screening mammogram for malignant neoplasm of breast: Secondary | ICD-10-CM | POA: Insufficient documentation

## 2023-03-20 ENCOUNTER — Encounter (HOSPITAL_BASED_OUTPATIENT_CLINIC_OR_DEPARTMENT_OTHER): Payer: Self-pay | Admitting: Physical Therapy

## 2023-03-20 ENCOUNTER — Ambulatory Visit (HOSPITAL_BASED_OUTPATIENT_CLINIC_OR_DEPARTMENT_OTHER): Payer: Medicare Other | Admitting: Physical Therapy

## 2023-03-20 DIAGNOSIS — M6281 Muscle weakness (generalized): Secondary | ICD-10-CM | POA: Diagnosis not present

## 2023-03-20 DIAGNOSIS — M5459 Other low back pain: Secondary | ICD-10-CM

## 2023-03-20 NOTE — Therapy (Signed)
OUTPATIENT PHYSICAL THERAPY THORACOLUMBAR TREATMENT  PHYSICAL THERAPY DISCHARGE SUMMARY  Visits from Start of Care: 6  Current functional level related to goals / functional outcomes: Pt is safe and indep with functional mobility and adl's   Remaining deficits: Chronic pain   Education / Equipment: Management of condition/ HEP   Patient agrees to discharge. Patient goals were partially met. Patient is being discharged due to the patient's request.  Patient Name: Sara Mills MRN: [id number] DOB:September 11, 1951, 72 y.o., female Today's Date: 03/20/2023  END OF SESSION:  PT End of Session - 03/20/23 1218     Visit Number 6    Number of Visits 12    PT Start Time 1031    PT Stop Time 1115    PT Time Calculation (min) 44 min    Activity Tolerance Patient tolerated treatment well    Behavior During Therapy [facility] for tasks assessed/performed             Past Medical History:  Diagnosis Date   Angioedema 06/08/2017   Diabetes mellitus    GERD (gastroesophageal reflux disease)    Hyperlipidemia    Hypertension    Past Surgical History:  Procedure Laterality Date   ABDOMINAL HYSTERECTOMY  1995   and BSO   APPENDECTOMY     BILATERAL OOPHORECTOMY     BREAST BIOPSY Right 02/15/2021   right breast calcs, x marker, BENIGN BREAST TISSUE WITH FIBROADENOMATOID CHANGES ANDASSOCIATED CALCIFICATIONS. - NEGATIVE FOR ATYPIA AND MALIGNANCY   BUNIONECTOMY     CARPAL TUNNEL RELEASE Right    CESAREAN SECTION  1977 and 1978   Patient Active Problem List   Diagnosis Date Noted   Stage 3a chronic kidney disease 05/20/2022   Tinnitus aurium, left 04/04/2022   Breast calcifications on mammogram 02/03/2021   History of pneumonia 10/05/2020   Viral URI 05/19/2020   Herpes simplex 05/20/2019   Angiotensin converting enzyme inhibitor-aggravated angioedema 02/20/2019   Hepatic steatosis 02/20/2019   Cervical spondylosis with radiculopathy 11/19/2018   Low back pain due to displacement of  intervertebral disc 09/29/2018   Long-term use of high-risk medication 12/20/2017   Sacro-iliac pain 06/08/2017   Encounter for long-term (current) use of other high-risk medications 09/15/2015   Encounter for preventive health examination 01/12/2014   Diabetes mellitus type 2 in obese 08/26/2012   Overweight (BMI 25.0-29.9) 03/22/2012   Premature menopause on hormone replacement therapy 03/20/2012   Essential hypertension    THYROID NODULE 08/06/2007   COLONIC POLYPS 07/27/2007   Hyperlipidemia due to type 2 diabetes mellitus 07/27/2007   GERD 12/09/2004    PCP: Sherlene Shams, MD   REFERRING PROVIDER: Venita Lick, MD   REFERRING DIAG: M54.16 (ICD-10-CM) - Radiculopathy, lumbar region   Rationale for Evaluation and Treatment: Rehabilitation  THERAPY DIAG:  Other low back pain  Muscle weakness (generalized)  ONSET DATE: >5 yrs  SUBJECTIVE:  SUBJECTIVE STATEMENT: Pt reports she does not feel the therapy is helping and would like to be DC    PERTINENT HISTORY:   As per Dr Darrick Huntsman 01/06/23 recent episode of back pain. Treated by TW dec 26. Resolved. Still has tramadol leftover. Still waiting for aquatic therapy ordered by orthopedics for place in [place]   PAIN:  Are you having pain? yes: NPRS scale: 3/10 Pain location: bilat lower back Pain description: ache Aggravating factors: getting up from a chair or OOB, moving in bed Relieving factors: sitting still or up moving around  PRECAUTIONS: Back  WEIGHT BEARING RESTRICTIONS: No  FALLS:  Has patient fallen in last 6 months? No  LIVING ENVIRONMENT: Lives with: lives with their spouse Lives in: House/apartment Stairs: Yes: Internal: 16 steps; on right going up Has following equipment at home: None  OCCUPATION: retired  PLOF:  Independent  PATIENT GOALS: Build up muscles, move with less pain, return to walking  NEXT MD VISIT: as needed  OBJECTIVE:   DIAGNOSTIC FINDINGS:  MRI cervical spine 4/22 to be sent over [facility] dr [facility] No recent dx for LB  PATIENT SURVEYS:  FOTO Primary score:57% with goal of 61% visit 11  SCREENING FOR RED FLAGS: none  COGNITION: Overall cognitive status: Within functional limits for tasks assessed     SENSATION: Pt reports no radicular pain. In past she has after cyct removed no more pain  MUSCLE LENGTH: Hamstrings: Right 70 deg; Left 80 deg   POSTURE: No Significant postural limitations but guarded/stiff  PALPATION: Tight paraspinals throughout thoracic spine.  TTP about    Cervical ROM: WFL but pain at end ranges   LUMBAR ROM:    Pain and end range AROM eval  Flexion full  Extension full  Right lateral flexion 50%  Left lateral flexion full  Right rotation full  Left rotation full   (Blank rows = not tested)  LOWER EXTREMITY ROM:     WFL  LOWER EXTREMITY MMT:    Hip strength: grossly 4-/5  LUMBAR SPECIAL TESTS:  Straight leg raise test: Negative, Slump test: Negative, and Thomas test: Negative  FUNCTIONAL TESTS:  5 times sit to stand: 16.60 Timed up and go (TUG): 9.26 4 stage balance test 16/16  GAIT: Distance walked: >538ft Assistive device utilized: None Level of assistance: Complete Independence Comments: slow with STS transfer, initial gait antalgic, cadence slowed shorten step length after 20 ft gait normalizes  TODAY'S TREATMENT:                                                                                                                              Pt seen for aquatic therapy today.  Treatment took place in water 3.5-4 ft in depth at the [facility] pool. Temp of water was 91.  Pt entered/exited the pool via stairs independently with bilat rail.  * unsupported walking forward/backwards, multiple laps  * side  step with rainbow hand floats with  arm addct/abdct - 2 laps *HB carry: forward, back and sideways with rainbow HB *3way tap then 3 way kick. Ue support HB *SLS R/L * TrA set with solid noodle pull down to thighs x10 ea in wide stance then staggered stance *Hip hinge *tandem walking forward and back *STS from 3 water step  Pt requires the buoyancy and hydrostatic pressure of water for support, and to offload joints by unweighting joint load by at least 50 % in navel deep water and by at least 75-80% in chest to neck deep water.  Viscosity of the water is needed for resistance of strengthening. Water current perturbations provides challenge to standing balance requiring increased core activation.     PATIENT EDUCATION:  Education details: aquatic progressions/modifications   Person educated: patient Education method: Explanation Education comprehension: verbalized understanding, returned demo   HOME EXERCISE PROGRAM: 02/25/23: Verbally given - seated or standing hamstring stretch 15s x 2 each LE, seated glute/piriformis stretch 15s x 2 each LE, R/L pec stretch holding wall with elbow straight (abdct ~90 deg) x 15s each  Access Code: [email or web address] URL: [email or web address] Date: 03/20/2023 Prepared by: Geni BersFrankie Salima Rumer  This aquatic home exercise program from [employer] utilizes pictures from land based exercises, but has been adapted prior to lamination and issuance.    Exercises - Kettlebell Suitcase Carry  - 1 x daily - 7 x weekly - 3 sets - 10 reps - Side Stepping with Hand Floats  - 1 x daily - 7 x weekly - 3 sets - 10 reps - Standing 3-Way Leg Reach  - 1 x daily - 7 x weekly - 3 sets - 10 reps - Single Leg Stance  - 1 x daily - 7 x weekly - 3 sets - 10 reps - Forward Backward Tandem Walking with Hand Floats  - 1 x daily - 7 x weekly - 3 sets - 10 reps - Shoulder Extension with Resistance  - 1 x daily - 7 x weekly - 3 sets - 10 reps - Sit to Stand  - 1 x daily - 7 x  weekly - 3 sets - 10 reps - Standing Hip Hinge  - 1 x daily - 7 x weekly - 3 sets - 10 reps  ASSESSMENT:  CLINICAL IMPRESSION: Pt requesting DC.  She does not see any benefit as pain is unchanged. She was interested in being assigned a HEP for completion at her private pool once weather warms.  She is directed through program, it is created laminated and issued.  She demonstrates understanding and indep with. Pt DC   OBJECTIVE IMPAIRMENTS: decreased activity tolerance, decreased strength, postural dysfunction, and pain.   ACTIVITY LIMITATIONS: sleeping, transfers, and caring for others  PARTICIPATION LIMITATIONS: driving, shopping, community activity, and yard work  PERSONAL FACTORS: 1-2 comorbidities: kidney dysfunction/ability to take meds; cervical through lumbar spine dysfunction  are also affecting patient's functional outcome.   REHAB POTENTIAL: Good  CLINICAL DECISION MAKING: Evolving/moderate complexity  EVALUATION COMPLEXITY: Moderate   GOALS: Goals reviewed with patient? No  SHORT TERM GOALS: Target date: 03/07/23  Pt will tolerate full aquatic sessions consistently without increase in pain and with improving function to demonstrate good toleration and effectiveness of intervention.   Baseline: Goal status: MET - 03/18/23  2.  Pt will complete 10 consecutive STS from 4th step (minimal submersion) without increase in pain. Baseline: mild intermittent LBP during exercise-03/18/23 Goal status:PARTIALLY MET   3.  Pt will report decreased worst pain to <5/10 for improved toleration  to functional mobility Baseline: 6/10 Goal status: ONGOING - 03/18/23, up to 8/10  4.  Pt will be indep and compliant with cervical ROM HEP Baseline:none  Goal status:ONGOING 03/18/23  5.  Pt will be indep with proper mechanics with sup<>sit Baseline: extreme pain/questionable technique Goal status:  MET - 03/18/23   LONG TERM GOALS: Target date: 03/27/23  Pt to meet stated Foto Goal  61 Baseline: 46% risk adjusted Goal status: INITIAL  2.  Pt will improve on 5 X STS test to <or=  12s  to demonstrate improving functional lower extremity strength, transitional movements, and balance  Baseline: 16.60 Goal status: INITIAL  3.  Pt will be indep with final HEP's (land and aquatic as appropriate) for continued management of condition  Baseline: none Goal status: INITIAL  4.  Pt will increase hip strength by up to 1 full grade to meet personal goal and improve mobility Baseline: see chart Goal status: INITIAL    PLAN:  PT FREQUENCY: 1-2x/week  PT DURATION: 6 weeks  PLANNED INTERVENTIONS: Therapeutic exercises, Therapeutic activity, Neuromuscular re-education, Balance training, Gait training, Patient/Family education, Self Care, Joint mobilization, Joint manipulation, Stair training, Orthotic/Fit training, DME instructions, Aquatic Therapy, Dry Needling, Electrical stimulation, Moist heat, Taping, Traction, Ultrasound, Ionotophoresis 4mg /ml Dexamethasone, Manual therapy, and Re-evaluation.  PLAN FOR NEXT SESSION: aquatic for core strength to improved stability and LE strength for improved mobility. STS transfers, bed mobility. HEP  Corrie Dandy [place]) Lasasha Brophy MPT 03/20/23 12:20 PM [facility] [address] [place], [place], [address] Phone: [phone]   Fax:  [phone]

## 2023-03-25 ENCOUNTER — Ambulatory Visit (HOSPITAL_BASED_OUTPATIENT_CLINIC_OR_DEPARTMENT_OTHER): Payer: Medicare Other | Admitting: Physical Therapy

## 2023-03-27 ENCOUNTER — Ambulatory Visit (HOSPITAL_BASED_OUTPATIENT_CLINIC_OR_DEPARTMENT_OTHER): Payer: Medicare Other | Admitting: Physical Therapy

## 2023-03-31 DIAGNOSIS — D631 Anemia in chronic kidney disease: Secondary | ICD-10-CM | POA: Diagnosis not present

## 2023-03-31 DIAGNOSIS — N2581 Secondary hyperparathyroidism of renal origin: Secondary | ICD-10-CM | POA: Diagnosis not present

## 2023-03-31 DIAGNOSIS — E1122 Type 2 diabetes mellitus with diabetic chronic kidney disease: Secondary | ICD-10-CM | POA: Diagnosis not present

## 2023-03-31 DIAGNOSIS — E785 Hyperlipidemia, unspecified: Secondary | ICD-10-CM | POA: Diagnosis not present

## 2023-03-31 DIAGNOSIS — D696 Thrombocytopenia, unspecified: Secondary | ICD-10-CM | POA: Diagnosis not present

## 2023-03-31 DIAGNOSIS — N1831 Chronic kidney disease, stage 3a: Secondary | ICD-10-CM | POA: Diagnosis not present

## 2023-04-04 ENCOUNTER — Telehealth: Payer: Self-pay | Admitting: Internal Medicine

## 2023-04-11 MED ORDER — LOSARTAN POTASSIUM 100 MG PO TABS: 100.0000 mg | ORAL_TABLET | Freq: Every day | ORAL | 1 refills | Status: DC

## 2023-04-11 NOTE — Addendum Note (Signed)
Addended by: Sandy Salaam on: 04/11/2023 02:55 PM   Modules accepted: Orders

## 2023-04-11 NOTE — Telephone Encounter (Signed)
Per last office note pt was to increase the losartan to 100 mg daily. I have sent in the 100 mg tablet to pharmacy. Pt is aware.

## 2023-04-11 NOTE — Telephone Encounter (Signed)
Pt need a refill on losartan sent to [facility] and she was told by the provider to double up but she can not get them refilled

## 2023-04-30 ENCOUNTER — Other Ambulatory Visit: Payer: Self-pay | Admitting: Internal Medicine

## 2023-06-07 ENCOUNTER — Other Ambulatory Visit: Payer: Self-pay | Admitting: Internal Medicine

## 2023-06-20 DIAGNOSIS — M25511 Pain in right shoulder: Secondary | ICD-10-CM | POA: Diagnosis not present

## 2023-06-20 DIAGNOSIS — M542 Cervicalgia: Secondary | ICD-10-CM | POA: Diagnosis not present

## 2023-06-28 ENCOUNTER — Other Ambulatory Visit: Payer: Self-pay | Admitting: Internal Medicine

## 2023-07-07 ENCOUNTER — Ambulatory Visit: Payer: Medicare Other | Admitting: Internal Medicine

## 2023-07-11 ENCOUNTER — Other Ambulatory Visit: Payer: Self-pay | Admitting: Internal Medicine

## 2023-07-15 ENCOUNTER — Other Ambulatory Visit: Payer: Self-pay | Admitting: Internal Medicine

## 2023-07-24 ENCOUNTER — Encounter (INDEPENDENT_AMBULATORY_CARE_PROVIDER_SITE_OTHER): Payer: Self-pay

## 2023-08-04 ENCOUNTER — Other Ambulatory Visit: Payer: Self-pay | Admitting: Internal Medicine

## 2023-08-04 NOTE — Telephone Encounter (Signed)
Medication was discontinued on 10/04/2022. Is it okay to refuse?

## 2023-08-25 ENCOUNTER — Telehealth: Payer: Self-pay

## 2023-08-25 ENCOUNTER — Other Ambulatory Visit (HOSPITAL_COMMUNITY): Payer: Self-pay

## 2023-08-25 NOTE — Telephone Encounter (Signed)
Pharmacy Patient Advocate Encounter   Received notification from CoverMyMeds that prior authorization for Zolpidem is required/requested.   Insurance verification completed.   The patient is insured through [facility] [employer] [place] .   Per test claim: PA required; PA submitted to [facility]  Medicaid via CoverMyMeds Key/confirmation #/EOC  [id number] Status is pending

## 2023-09-01 ENCOUNTER — Other Ambulatory Visit (HOSPITAL_COMMUNITY): Payer: Self-pay

## 2023-09-01 NOTE — Telephone Encounter (Signed)
Pharmacy Patient Advocate Encounter  Received notification from [facility] Medicaid that Prior Authorization for Zolpidem has been APPROVED from 9.16.24 to 12.31.2099. Ran test claim,& the Rx has been last filled on 08/25/23 .   This test claim was processed through [facility]- copay amounts may vary at other pharmacies due to pharmacy/plan contracts, or as the patient moves through the different stages of their insurance plan.   PA #/Case ID/Reference #: Key: [id number]

## 2023-09-08 ENCOUNTER — Encounter: Payer: Self-pay | Admitting: Internal Medicine

## 2023-09-08 ENCOUNTER — Ambulatory Visit (INDEPENDENT_AMBULATORY_CARE_PROVIDER_SITE_OTHER): Payer: Medicare Other | Admitting: Internal Medicine

## 2023-09-08 VITALS — BP 124/76 | HR 71 | Ht 61.0 in | Wt 144.6 lb

## 2023-09-08 DIAGNOSIS — K529 Noninfective gastroenteritis and colitis, unspecified: Secondary | ICD-10-CM

## 2023-09-08 DIAGNOSIS — E669 Obesity, unspecified: Secondary | ICD-10-CM

## 2023-09-08 DIAGNOSIS — E785 Hyperlipidemia, unspecified: Secondary | ICD-10-CM

## 2023-09-08 DIAGNOSIS — Z6827 Body mass index (BMI) 27.0-27.9, adult: Secondary | ICD-10-CM | POA: Diagnosis not present

## 2023-09-08 DIAGNOSIS — R300 Dysuria: Secondary | ICD-10-CM | POA: Diagnosis not present

## 2023-09-08 DIAGNOSIS — E1169 Type 2 diabetes mellitus with other specified complication: Secondary | ICD-10-CM

## 2023-09-08 DIAGNOSIS — I10 Essential (primary) hypertension: Secondary | ICD-10-CM

## 2023-09-08 DIAGNOSIS — E041 Nontoxic single thyroid nodule: Secondary | ICD-10-CM

## 2023-09-08 DIAGNOSIS — O26891 Other specified pregnancy related conditions, first trimester: Secondary | ICD-10-CM

## 2023-09-08 DIAGNOSIS — Z809 Family history of malignant neoplasm, unspecified: Secondary | ICD-10-CM | POA: Diagnosis not present

## 2023-09-08 DIAGNOSIS — E663 Overweight: Secondary | ICD-10-CM | POA: Diagnosis not present

## 2023-09-08 DIAGNOSIS — N1831 Chronic kidney disease, stage 3a: Secondary | ICD-10-CM

## 2023-09-08 DIAGNOSIS — Z23 Encounter for immunization: Secondary | ICD-10-CM

## 2023-09-08 DIAGNOSIS — Z7985 Long-term (current) use of injectable non-insulin antidiabetic drugs: Secondary | ICD-10-CM

## 2023-09-08 NOTE — Progress Notes (Signed)
Subjective:  Patient ID: Sara Mills, female    DOB: 1951/08/31  Age: 72 y.o. MRN: [id number]  CC: The primary encounter diagnosis was Postprandial diarrhea. Diagnoses of Essential hypertension, THYROID NODULE, Hyperlipidemia due to type 2 diabetes mellitus (HCC), Overweight (BMI 25.0-29.9), Family history of carcinoid syndrome, Need for influenza vaccination, Dysuria, Type 2 diabetes mellitus with obesity (HCC), and Stage 3a chronic kidney disease (HCC) were also pertinent to this visit.   HPI Sara Mills presents for  Chief Complaint  Patient presents with   Medical Management of Chronic Issues    6 month follow up    Hennessey has  multiple issues today .    1) T2DM:  She   does not feel well and is ot  exercising regularly or trying to lose weight. Checking  blood sugars less than once daily at variable times, usually only if she feels she may be having a hypoglycemic event. .  BS have been under 130 fasting and < 150 post prandially.    STOPPED GLIPIZIDE  ONE MONTH AGO. Due to diarrhea.   BLOOD SUGARS HAVE been under 150 FASTING AND RANDOMS HAVE RANGED FROM 78 TO 200 (RARELY)   Taking   medications as directed. Following a carbohydrate modified diet 6 days per week. Denies numbness, burning and tingling of extremities. Appetite is good.    2) Chronic diarrhea :  occurs within 30 minutes  after every meal and has been occurring at least daily for 3 months.  Has not stopped metformin yet.   4 LB WEIGHT LOSS .  LAST COLONOSCOPY NORMAL AT AGE 31 BY JYOTHI MANN .  HAS A CONSTANT ACHING IN HER STOMACH 24/7 , not worse after eating    3) CHRONIC NECK PAIN : HAS BEEN  DEFERRNG SURGERY.  STOPPED GABAPENTIN .  DEVELOPED RIGHT ARM PAIN RADIATING TO HAND M DIAGNOSED WITH SHOULDER ISSUE AND HAS BEEN REFERRED TO SHOULDER SPECIALISTS BUT HAS NOT GONE DUE TO HUSBAND'S HEALTH ISSUES   4) RECURRENT EPISODES OF BURNING,  CHILLS DURING URINATION   5) RECURRENT HERPETIC OUTBREAKS   6) HEMORRHOIDS  BOTHERING HER BUT NOT BLEEDING UNLESS SHE HAS MULTIPLE EPISODES OF diarrhea   Outpatient Medications Prior to Visit  Medication Sig Dispense Refill   gabapentin (NEURONTIN) 300 MG capsule TAKE 1 CAPSULE BY MOUTH TWICE A DAY 120 capsule 1   glucose blood (ACCU-CHEK GUIDE) test strip USE 1 STRIP TO CHECK BLOOD GLUCOSE ONCE A DAY Dx code: E11.69 50 strip 1   losartan (COZAAR) 50 MG tablet TAKE 1 TABLET BY MOUTH EVERY DAY 90 tablet 1   metFORMIN (GLUCOPHAGE) 1000 MG tablet TAKE 1 TABLET (1,000 MG TOTAL) BY MOUTH TWICE A DAY WITH FOOD 180 tablet 1   rosuvastatin (CRESTOR) 20 MG tablet TAKE 1 TABLET BY MOUTH EVERY DAY (Patient not taking: Reported on 09/08/2023) 90 tablet 1   tiZANidine (ZANAFLEX) 4 MG tablet Take 1 tablet (4 mg total) by mouth every 6 (six) hours as needed for muscle spasms. (Patient not taking: Reported on 02/18/2023) 90 tablet 1   traMADol (ULTRAM) 50 MG tablet Take 1 tablet (50 mg total) by mouth 2 (two) times daily. (Patient not taking: Reported on 10/04/2022) 60 tablet 5   valACYclovir (VALTREX) 500 MG tablet TAKE 1 TABLET BY MOUTH THREE TIMES A DAY (Patient not taking: Reported on 09/08/2023) 21 tablet 2   zolpidem (AMBIEN) 10 MG tablet Take 1 tablet (10 mg total) by mouth at bedtime as needed for sleep.  30 tablet 5   glipiZIDE (GLUCOTROL) 5 MG tablet TAKE 1/2 TABLET (2.5 MG TOTAL) BY MOUTH TWICE DAILY BEFORE A MEAL (Patient not taking: Reported on 09/08/2023) 90 tablet 1   losartan (COZAAR) 100 MG tablet Take 1 tablet (100 mg total) by mouth daily. (Patient not taking: Reported on 09/08/2023) 90 tablet 1   No facility-administered medications prior to visit.    Review of Systems;  Patient denies headache, fevers, malaise, unintentional weight loss, skin rash, eye pain, sinus congestion and sinus pain, sore throat, dysphagia,  hemoptysis , cough, dyspnea, wheezing, chest pain, palpitations, orthopnea, edema, abdominal pain, nausea, melena, diarrhea, constipation, flank pain,  dysuria, hematuria, urinary  Frequency, nocturia, numbness, tingling, seizures,  Focal weakness, Loss of consciousness,  Tremor, insomnia, depression, anxiety, and suicidal ideation.      Objective:  BP 124/76   Pulse 71   Ht 5\' 1"  (1.549 m)   Wt 144 lb 9.6 oz (65.6 kg)   SpO2 98%   BMI 27.32 kg/m   BP Readings from Last 3 Encounters:  09/08/23 124/76  02/18/23 136/82  01/06/23 138/84    Wt Readings from Last 3 Encounters:  09/08/23 144 lb 9.6 oz (65.6 kg)  02/18/23 148 lb 9.6 oz (67.4 kg)  01/06/23 148 lb (67.1 kg)    Physical Exam Vitals reviewed.  Constitutional:      General: She is not in acute distress.    Appearance: Normal appearance. She is normal weight. She is not ill-appearing, toxic-appearing or diaphoretic.  HENT:     Head: Normocephalic.  Eyes:     General: No scleral icterus.       Right eye: No discharge.        Left eye: No discharge.     Conjunctiva/sclera: Conjunctivae normal.  Cardiovascular:     Rate and Rhythm: Normal rate and regular rhythm.     Heart sounds: Normal heart sounds.  Pulmonary:     Effort: Pulmonary effort is normal. No respiratory distress.     Breath sounds: Normal breath sounds.  Abdominal:     Palpations: Abdomen is soft. There is no mass.     Tenderness: There is no abdominal tenderness. There is no guarding.     Hernia: No hernia is present.  Musculoskeletal:        General: Normal range of motion.  Skin:    General: Skin is warm and dry.  Neurological:     General: No focal deficit present.     Mental Status: She is alert and oriented to person, place, and time. Mental status is at baseline.  Psychiatric:        Mood and Affect: Mood normal.        Behavior: Behavior normal.        Thought Content: Thought content normal.        Judgment: Judgment normal.    Lab Results  Component Value Date   HGBA1C 7.5 (H) 09/08/2023   HGBA1C 7.1 (H) 01/06/2023   HGBA1C 7.2 (H) 10/04/2022    Lab Results  Component  Value Date   CREATININE 0.91 09/08/2023   CREATININE 1.04 02/18/2023   CREATININE 0.98 01/06/2023    Lab Results  Component Value Date   WBC 8.6 09/08/2023   HGB 13.1 09/08/2023   HCT 40.3 09/08/2023   PLT 151.0 09/08/2023   GLUCOSE 99 09/08/2023   CHOL 247 (H) 09/08/2023   TRIG 335.0 (H) 09/08/2023   HDL 42.40 09/08/2023   LDLDIRECT 158.0 09/08/2023  LDLCALC 138 (H) 09/08/2023   ALT 29 09/08/2023   AST 24 09/08/2023   NA 136 09/08/2023   K 3.8 09/08/2023   CL 101 09/08/2023   CREATININE 0.91 09/08/2023   BUN 15 09/08/2023   CO2 25 09/08/2023   TSH 1.00 09/08/2023   INR 1.1 06/02/2022   HGBA1C 7.5 (H) 09/08/2023   MICROALBUR 2.0 (H) 09/08/2023    MM 3D SCREENING MAMMOGRAM BILATERAL BREAST  Result Date: 03/20/2023 CLINICAL DATA:  Screening. EXAM: DIGITAL SCREENING BILATERAL MAMMOGRAM WITH TOMOSYNTHESIS AND CAD TECHNIQUE: Bilateral screening digital craniocaudal and mediolateral oblique mammograms were obtained. Bilateral screening digital breast tomosynthesis was performed. The images were evaluated with computer-aided detection. COMPARISON:  Previous exam(s). ACR Breast Density Category b: There are scattered areas of fibroglandular density. FINDINGS: There are no findings suspicious for malignancy. IMPRESSION: No mammographic evidence of malignancy. A result letter of this screening mammogram will be mailed directly to the patient. RECOMMENDATION: Screening mammogram in one year. (Code:[id number]-B-01Y) BI-RADS CATEGORY  1: Negative. Electronically Signed   By: Beckie Salts M.D.   On: 03/20/2023 15:45    Assessment & Plan:  .Postprandial diarrhea Assessment & Plan: Concern for carcinoid syndrome given family history (father) despite normal colonoscopy in 2023 .  Workup in progress   Orders: -     5 HIAA, quantitative, urine, 24 hour; Future  Essential hypertension Assessment & Plan: improved with  losartan  100 mg daily   Orders: -     Comprehensive metabolic panel -      Urine Culture  THYROID NODULE -     TSH  Hyperlipidemia due to type 2 diabetes mellitus (HCC) Assessment & Plan: She has stopped rosuvastatin due to preference   Lab Results  Component Value Date   CHOL 165 02/18/2023   HDL 47.40 02/18/2023   LDLCALC 143 (H) 01/06/2023   LDLDIRECT 86.0 02/18/2023   TRIG 251.0 (H) 02/18/2023   CHOLHDL 3 02/18/2023     Orders: -     Hemoglobin A1c -     Lipid panel -     LDL cholesterol, direct -     Comprehensive metabolic panel -     Microalbumin / creatinine urine ratio  Overweight (BMI 25.0-29.9) -     CBC with Differential/Platelet -     TSH  Family history of carcinoid syndrome -     5 HIAA, quantitative, urine, 24 hour; Future  Need for influenza vaccination -     Flu Vaccine Trivalent High Dose (Fluad)  Dysuria Assessment & Plan: Present for several weeks  without testing.  UA/micr/culture pending   Orders: -     Urinalysis, Routine w reflex microscopic -     Urine Culture  Type 2 diabetes mellitus with obesity (HCC) Assessment & Plan: Discussed alternative therapy with  ozempic  but she I s apprehensive.  She has had an unintentional 4 lb wt loss due chronic post prandial diarrhrea,  and recurrent hypoglycemic despite stoppting glipizide. Advised to stop metformin , continue suspension of glipizide . lPlacement of a sample  15 day Freestyle CBG monitor was attempted byt unsuccessful due to application error .  Return in 2 weeks with BS log.    Stage 3a chronic kidney disease (HCC) Assessment & Plan:  Stable,  multifactorial  Due to DM,  HTN and NSAID use. Nephrology referral has been done.  Her last visit with Reymundo Poll was reviewed; continue ARB  Lab Results  Component Value Date   CREATININE 1.04 02/18/2023  Other orders -     Extra Specimen     I provided 44 minutes of face-to-face time during this encounter reviewing patient's last visit with me, patient's  most recent visit with gastroenterology,   recent surgical and non surgical procedures, previous  labs and imaging studies, counseling on currently addressed issues,  and post visit ordering to diagnostics and therapeutics .   Follow-up: Return in about 2 weeks (around 09/22/2023) for follow up diabetes.   Sherlene Shams, MD

## 2023-09-08 NOTE — Assessment & Plan Note (Signed)
Present for several weeks  without testing.  UA/micr/culture pending

## 2023-09-08 NOTE — Assessment & Plan Note (Addendum)
Discussed alternative therapy with  ozempic  but she I s apprehensive.  She has had an unintentional 4 lb wt loss due chronic post prandial diarrhrea,  and recurrent hypoglycemic despite stoppting glipizide. Advised to stop metformin , continue suspension of glipizide . lPlacement of a sample  15 day Freestyle CBG monitor was attempted byt unsuccessful due to application error .  Return in 2 weeks with BS log.

## 2023-09-08 NOTE — Assessment & Plan Note (Signed)
improved with  losartan  100 mg daily

## 2023-09-08 NOTE — Assessment & Plan Note (Signed)
Concern for carcinoid syndrome given family history (father) despite normal colonoscopy in 2023 .  Workup in progress

## 2023-09-08 NOTE — Assessment & Plan Note (Addendum)
She has stopped rosuvastatin due to preference   Lab Results  Component Value Date   CHOL 165 02/18/2023   HDL 47.40 02/18/2023   LDLCALC 143 (H) 01/06/2023   LDLDIRECT 86.0 02/18/2023   TRIG 251.0 (H) 02/18/2023   CHOLHDL 3 02/18/2023

## 2023-09-08 NOTE — Assessment & Plan Note (Signed)
Stable,  multifactorial  Due to DM,  HTN and NSAID use. Nephrology referral has been done.  Her last visit with Reymundo Poll was reviewed; continue ARB  Lab Results  Component Value Date   CREATININE 1.04 02/18/2023

## 2023-09-08 NOTE — Patient Instructions (Addendum)
Stop the metformin COMPLETELY  AND DO NOT RESUME GLIPIZIDE   Monitor your blood sugars WITH YOUR PHONE USING THE CONTINUOYS BLOOD GLUCOSE MONITOR  RETURN IN 2 WEEKS TO REVIEW  Resume the gabapentin for your pain .  It is NOT  the cause of your diarrhea  You can add tylenol and tramadol for pain management

## 2023-09-09 LAB — CBC WITH DIFFERENTIAL/PLATELET
Basophils Absolute: 0.1 10*3/uL (ref 0.0–0.1)
Basophils Relative: 0.7 % (ref 0.0–3.0)
Eosinophils Absolute: 0.2 10*3/uL (ref 0.0–0.7)
Eosinophils Relative: 2.2 % (ref 0.0–5.0)
HCT: 40.3 % (ref 36.0–46.0)
Hemoglobin: 13.1 g/dL (ref 12.0–15.0)
Lymphocytes Relative: 24.8 % (ref 12.0–46.0)
Lymphs Abs: 2.1 10*3/uL (ref 0.7–4.0)
MCHC: 32.4 g/dL (ref 30.0–36.0)
MCV: 88.6 fL (ref 78.0–100.0)
Monocytes Absolute: 0.7 10*3/uL (ref 0.1–1.0)
Monocytes Relative: 8 % (ref 3.0–12.0)
Neutro Abs: 5.5 10*3/uL (ref 1.4–7.7)
Neutrophils Relative %: 64.3 % (ref 43.0–77.0)
Platelets: 151 10*3/uL (ref 150.0–400.0)
RBC: 4.55 Mil/uL (ref 3.87–5.11)
RDW: 14.4 % (ref 11.5–15.5)
WBC: 8.6 10*3/uL (ref 4.0–10.5)

## 2023-09-09 LAB — URINALYSIS, ROUTINE W REFLEX MICROSCOPIC
Bilirubin Urine: NEGATIVE
Hgb urine dipstick: NEGATIVE
Ketones, ur: NEGATIVE
Leukocytes,Ua: NEGATIVE
Nitrite: NEGATIVE
RBC / HPF: NONE SEEN (ref 0–?)
Specific Gravity, Urine: 1.025 (ref 1.000–1.030)
Total Protein, Urine: NEGATIVE
Urine Glucose: NEGATIVE
Urobilinogen, UA: 0.2 (ref 0.0–1.0)
pH: 6 (ref 5.0–8.0)

## 2023-09-09 LAB — LIPID PANEL
Cholesterol: 247 mg/dL — ABNORMAL HIGH (ref 0–200)
HDL: 42.4 mg/dL (ref >39.00)
LDL Cholesterol: 138 mg/dL — ABNORMAL HIGH (ref 0–99)
NonHDL: 204.79
Total CHOL/HDL Ratio: 6
Triglycerides: 335 mg/dL — ABNORMAL HIGH (ref 0.0–149.0)
VLDL: 67 mg/dL — ABNORMAL HIGH (ref 0.0–40.0)

## 2023-09-09 LAB — COMPREHENSIVE METABOLIC PANEL
ALT: 29 U/L (ref 0–35)
AST: 24 U/L (ref 0–37)
Albumin: 4.5 g/dL (ref 3.5–5.2)
Alkaline Phosphatase: 79 U/L (ref 39–117)
BUN: 15 mg/dL (ref 6–23)
CO2: 25 meq/L (ref 19–32)
Calcium: 9.8 mg/dL (ref 8.4–10.5)
Chloride: 101 meq/L (ref 96–112)
Creatinine, Ser: 0.91 mg/dL (ref 0.40–1.20)
GFR: 64.09 mL/min (ref >60.00)
Glucose, Bld: 99 mg/dL (ref 70–99)
Potassium: 3.8 meq/L (ref 3.5–5.1)
Sodium: 136 meq/L (ref 135–145)
Total Bilirubin: 0.7 mg/dL (ref 0.2–1.2)
Total Protein: 7.8 g/dL (ref 6.0–8.3)

## 2023-09-09 LAB — LDL CHOLESTEROL, DIRECT: Direct LDL: 158 mg/dL

## 2023-09-09 LAB — HEMOGLOBIN A1C: Hgb A1c MFr Bld: 7.5 % — ABNORMAL HIGH (ref 4.6–6.5)

## 2023-09-09 LAB — TSH: TSH: 1 u[IU]/mL (ref 0.35–5.50)

## 2023-09-09 LAB — MICROALBUMIN / CREATININE URINE RATIO
Creatinine,U: 114.5 mg/dL
Microalb Creat Ratio: 1.7 mg/g (ref 0.0–30.0)
Microalb, Ur: 2 mg/dL — ABNORMAL HIGH (ref 0.0–1.9)

## 2023-09-10 DIAGNOSIS — K529 Noninfective gastroenteritis and colitis, unspecified: Secondary | ICD-10-CM | POA: Diagnosis not present

## 2023-09-10 DIAGNOSIS — Z809 Family history of malignant neoplasm, unspecified: Secondary | ICD-10-CM | POA: Diagnosis not present

## 2023-09-10 LAB — URINE CULTURE
MICRO NUMBER:: 15530581
SPECIMEN QUALITY:: ADEQUATE

## 2023-09-11 NOTE — Addendum Note (Signed)
Addended by: Sherlene Shams on: 09/11/2023 08:38 AM   Modules accepted: Level of Service

## 2023-09-19 ENCOUNTER — Telehealth: Payer: Self-pay

## 2023-09-19 LAB — EXTRA SPECIMEN

## 2023-09-19 LAB — 5 HIAA, QUANTITATIVE, URINE, 24 HOUR
5 HIAA, 24 Hour Urine: 5.5 mg/(24.h) (ref <=6.0)
Total Volume: 950 mL

## 2023-09-19 NOTE — Telephone Encounter (Signed)
Error

## 2023-09-21 ENCOUNTER — Encounter: Payer: Self-pay | Admitting: Internal Medicine

## 2023-09-21 DIAGNOSIS — M791 Myalgia, unspecified site: Secondary | ICD-10-CM

## 2023-09-21 DIAGNOSIS — K529 Noninfective gastroenteritis and colitis, unspecified: Secondary | ICD-10-CM

## 2023-09-22 DIAGNOSIS — M791 Myalgia, unspecified site: Secondary | ICD-10-CM | POA: Insufficient documentation

## 2023-09-22 MED ORDER — GLIPIZIDE 5 MG PO TABS: 2.5000 mg | ORAL_TABLET | Freq: Two times a day (BID) | ORAL | 1 refills | Status: DC

## 2023-09-22 NOTE — Assessment & Plan Note (Signed)
Repeat trial of Crestor failed due to return of diffuse myalgias

## 2023-09-22 NOTE — Assessment & Plan Note (Addendum)
Concern for carcinoid syndrome given family history (father) despite normal colonoscopy in 2023 .  5H!AA 24 hour urine was normal. . Symptoms have improved with suspension of metformin

## 2023-09-23 ENCOUNTER — Telehealth: Payer: Self-pay

## 2023-09-23 NOTE — Telephone Encounter (Signed)
Detailed VM left informing pt that she does not have to make an appt with Dr. Loreta Ave and that Dr. Darrick Huntsman stated if the diarrhea returns she can be re referred    Sherlene Shams, MD  Donavan Foil, CMA No she can cancel the appt with Dr Loreta Ave and if the diarrhea returns we will re refer

## 2023-09-24 DIAGNOSIS — E785 Hyperlipidemia, unspecified: Secondary | ICD-10-CM | POA: Diagnosis not present

## 2023-09-24 DIAGNOSIS — N2581 Secondary hyperparathyroidism of renal origin: Secondary | ICD-10-CM | POA: Diagnosis not present

## 2023-09-24 DIAGNOSIS — D631 Anemia in chronic kidney disease: Secondary | ICD-10-CM | POA: Diagnosis not present

## 2023-09-24 DIAGNOSIS — N1831 Chronic kidney disease, stage 3a: Secondary | ICD-10-CM | POA: Diagnosis not present

## 2023-09-24 DIAGNOSIS — D696 Thrombocytopenia, unspecified: Secondary | ICD-10-CM | POA: Diagnosis not present

## 2023-09-24 DIAGNOSIS — E1122 Type 2 diabetes mellitus with diabetic chronic kidney disease: Secondary | ICD-10-CM | POA: Diagnosis not present

## 2023-10-02 DIAGNOSIS — N1831 Chronic kidney disease, stage 3a: Secondary | ICD-10-CM | POA: Diagnosis not present

## 2023-10-02 DIAGNOSIS — R809 Proteinuria, unspecified: Secondary | ICD-10-CM | POA: Diagnosis not present

## 2023-10-02 DIAGNOSIS — K219 Gastro-esophageal reflux disease without esophagitis: Secondary | ICD-10-CM | POA: Diagnosis not present

## 2023-10-02 DIAGNOSIS — E785 Hyperlipidemia, unspecified: Secondary | ICD-10-CM | POA: Diagnosis not present

## 2023-10-02 DIAGNOSIS — M5459 Other low back pain: Secondary | ICD-10-CM | POA: Diagnosis not present

## 2023-10-02 DIAGNOSIS — D631 Anemia in chronic kidney disease: Secondary | ICD-10-CM | POA: Diagnosis not present

## 2023-10-02 DIAGNOSIS — E1122 Type 2 diabetes mellitus with diabetic chronic kidney disease: Secondary | ICD-10-CM | POA: Diagnosis not present

## 2023-10-02 DIAGNOSIS — I10 Essential (primary) hypertension: Secondary | ICD-10-CM | POA: Diagnosis not present

## 2023-10-17 ENCOUNTER — Ambulatory Visit: Payer: Medicare Other

## 2023-10-17 NOTE — Progress Notes (Signed)
Pt presented in office for Dexcom placement  and to set up reader pt was instructed step by step on how to use reader with sensor. Sensor was placed on left arm and reader was set up and paired with the sensor

## 2023-10-22 ENCOUNTER — Other Ambulatory Visit: Payer: Self-pay | Admitting: Internal Medicine

## 2023-10-27 ENCOUNTER — Telehealth: Payer: Self-pay

## 2023-10-27 NOTE — Telephone Encounter (Signed)
Spoke with Dr. Darrick Huntsman and she would like for pt to come by the office so we can print off the data from her dexcom reader. Pt stated that she could come by here tomorrow.

## 2023-10-27 NOTE — Telephone Encounter (Signed)
Prescription Request  10/27/2023  LOV: Visit date not found  What is the name of the medication or equipment? Dexcom sensor  Have you contacted your pharmacy to request a refill? No   Which pharmacy would you like this sent to?  [id number], [place] - [address] [place] [address] Phone: [phone] Fax: [phone]   Patient notified that their request is being sent to the clinical staff for review and that they should receive a response within 2 business days.   Please advise at Mobile [phone] (mobile)  Patient states she thought the sensor was supposed to last until 10/31/2023, but she received a message on her monitor saying it needs to be changed.

## 2023-11-04 DIAGNOSIS — M25511 Pain in right shoulder: Secondary | ICD-10-CM | POA: Diagnosis not present

## 2023-11-28 ENCOUNTER — Encounter: Payer: Self-pay | Admitting: Internal Medicine

## 2023-11-28 NOTE — Telephone Encounter (Signed)
 Care team updated and letter sent for eye exam notes.

## 2023-12-08 ENCOUNTER — Ambulatory Visit (INDEPENDENT_AMBULATORY_CARE_PROVIDER_SITE_OTHER): Payer: Medicare Other | Admitting: *Deleted

## 2023-12-08 VITALS — Ht 61.0 in | Wt 150.0 lb

## 2023-12-08 DIAGNOSIS — Z0000 Encounter for general adult medical examination without abnormal findings: Secondary | ICD-10-CM

## 2023-12-08 DIAGNOSIS — M25511 Pain in right shoulder: Secondary | ICD-10-CM | POA: Diagnosis not present

## 2023-12-08 DIAGNOSIS — M542 Cervicalgia: Secondary | ICD-10-CM | POA: Diagnosis not present

## 2023-12-08 NOTE — Patient Instructions (Signed)
Sara Mills , Thank you for taking time to come for your Medicare Wellness Visit. I appreciate your ongoing commitment to your health goals. Please review the following plan we discussed and let me know if I can assist you in the future.   Referrals/Orders/Follow-Ups/Clinician Recommendations: Remember to update you vaccines as discussed.  This is a list of the screening recommended for you and due dates:  Health Maintenance  Topic Date Due   Pneumonia Vaccine (3 of 3 - PPSV23 or PCV20) 09/14/2020   Eye exam for diabetics  02/26/2022   COVID-19 Vaccine (4 - 2024-25 season) 08/10/2023   DTaP/Tdap/Td vaccine (3 - Td or Tdap) 01/11/2024   Hemoglobin A1C  03/07/2024   Mammogram  03/18/2024   Yearly kidney function blood test for diabetes  09/07/2024   Yearly kidney health urinalysis for diabetes  09/07/2024   Complete foot exam   09/07/2024   Medicare Annual Wellness Visit  12/07/2024   Colon Cancer Screening  09/03/2027   Flu Shot  Completed   DEXA scan (bone density measurement)  Completed   Hepatitis C Screening  Completed   Zoster (Shingles) Vaccine  Completed   HPV Vaccine  Aged Out    Advanced directives: (Declined) Advance directive discussed with you today. Even though you declined this today, please call our office should you change your mind, and we can give you the proper paperwork for you to fill out.  Next Medicare Annual Wellness Visit scheduled for next year: Yes 12/13/24 @ 10:10   Managing Pain Without Opioids Opioids are strong medicines used to treat moderate to severe pain. For some people, especially those who have long-term (chronic) pain, opioids may not be the best choice for pain management due to: Side effects like nausea, constipation, and sleepiness. The risk of addiction (opioid use disorder). The longer you take opioids, the greater your risk of addiction. Pain that lasts for more than 3 months is called chronic pain. Managing chronic pain usually requires more  than one approach and is often provided by a team of health care providers working together (multidisciplinary approach). Pain management may be done at a pain management center or pain clinic. How to manage pain without the use of opioids Use non-opioid medicines Non-opioid medicines for pain may include: Over-the-counter or prescription non-steroidal anti-inflammatory drugs (NSAIDs). These may be the first medicines used for pain. They work well for muscle and bone pain, and they reduce swelling. Acetaminophen. This over-the-counter medicine may work well for milder pain but not swelling. Antidepressants. These may be used to treat chronic pain. A certain type of antidepressant (tricyclics) is often used. These medicines are given in lower doses for pain than when used for depression. Anticonvulsants. These are usually used to treat seizures but may also reduce nerve (neuropathic) pain. Muscle relaxants. These relieve pain caused by sudden muscle tightening (spasms). You may also use a pain medicine that is applied to the skin as a patch, cream, or gel (topical analgesic), such as a numbing medicine. These may cause fewer side effects than medicines taken by mouth. Do certain therapies as directed Some therapies can help with pain management. They include: Physical therapy. You will do exercises to gain strength and flexibility. A physical therapist may teach you exercises to move and stretch parts of your body that are weak, stiff, or painful. You can learn these exercises at physical therapy visits and practice them at home. Physical therapy may also involve: Massage. Heat wraps or applying heat or cold  to affected areas. Electrical signals that interrupt pain signals (transcutaneous electrical nerve stimulation, TENS). Weak lasers that reduce pain and swelling (low-level laser therapy). Signals from your body that help you learn to regulate pain (biofeedback). Occupational therapy. This helps  you to learn ways to function at home and work with less pain. Recreational therapy. This involves trying new activities or hobbies, such as a physical activity or drawing. Mental health therapy, including: Cognitive behavioral therapy (CBT). This helps you learn coping skills for dealing with pain. Acceptance and commitment therapy (ACT) to change the way you think and react to pain. Relaxation therapies, including muscle relaxation exercises and mindfulness-based stress reduction. Pain management counseling. This may be individual, family, or group counseling.  Receive medical treatments Medical treatments for pain management include: Nerve block injections. These may include a pain blocker and anti-inflammatory medicines. You may have injections: Near the spine to relieve chronic back or neck pain. Into joints to relieve back or joint pain. Into nerve areas that supply a painful area to relieve body pain. Into muscles (trigger point injections) to relieve some painful muscle conditions. A medical device placed near your spine to help block pain signals and relieve nerve pain or chronic back pain (spinal cord stimulation device). Acupuncture. Follow these instructions at home Medicines Take over-the-counter and prescription medicines only as told by your health care provider. If you are taking pain medicine, ask your health care providers about possible side effects to watch out for. Do not drive or use heavy machinery while taking prescription opioid pain medicine. Lifestyle  Do not use drugs or alcohol to reduce pain. If you drink alcohol, limit how much you have to: 0-1 drink a day for women who are not pregnant. 0-2 drinks a day for men. Know how much alcohol is in a drink. In the [place] one drink equals one 12 oz bottle of beer (355 mL), one 5 oz glass of wine (148 mL), or one 1 oz glass of hard liquor (44 mL). Do not use any products that contain nicotine or tobacco. These  products include cigarettes, chewing tobacco, and vaping devices, such as e-cigarettes. If you need help quitting, ask your health care provider. Eat a healthy diet and maintain a healthy weight. Poor diet and excess weight may make pain worse. Eat foods that are high in fiber. These include fresh fruits and vegetables, whole grains, and beans. Limit foods that are high in fat and processed sugars, such as fried and sweet foods. Exercise regularly. Exercise lowers stress and may help relieve pain. Ask your health care provider what activities and exercises are safe for you. If your health care provider approves, join an exercise class that combines movement and stress reduction. Examples include yoga and tai chi. Get enough sleep. Lack of sleep may make pain worse. Lower stress as much as possible. Practice stress reduction techniques as told by your therapist. General instructions Work with all your pain management providers to find the treatments that work best for you. You are an important member of your pain management team. There are many things you can do to reduce pain on your own. Consider joining an online or in-person support group for people who have chronic pain. Keep all follow-up visits. This is important. Where to find more information You can find more information about managing pain without opioids from: American Academy of Pain Medicine: painmed.org Institute for Chronic Pain: instituteforchronicpain.org American Chronic Pain Association: theacpa.org Contact a health care provider if: You have  side effects from pain medicine. Your pain gets worse or does not get better with treatments or home therapy. You are struggling with anxiety or depression. Summary Many types of pain can be managed without opioids. Chronic pain may respond better to pain management without opioids. Pain is best managed when you and a team of health care providers work together. Pain management  without opioids may include non-opioid medicines, medical treatments, physical therapy, mental health therapy, and lifestyle changes. Tell your health care providers if your pain gets worse or is not being managed well enough. This information is not intended to replace advice given to you by your health care provider. Make sure you discuss any questions you have with your health care provider. Document Revised: 03/07/2021 Document Reviewed: 03/07/2021 Elsevier Patient Education  2024 [employer].

## 2023-12-08 NOTE — Progress Notes (Cosign Needed)
Subjective:   ROSSELLA Mills is a 72 y.o. female who presents for Medicare Annual (Subsequent) preventive examination.  Visit Complete: Virtual I connected with  Sara Mills on 12/08/23 by a audio enabled telemedicine application and verified that I am speaking with the correct person using two identifiers.  Patient Location: Home  Provider Location: Office/Clinic  I discussed the limitations of evaluation and management by telemedicine. The patient expressed understanding and agreed to proceed.  Vital Signs: Because this visit was a virtual/telehealth visit, some criteria may be missing or patient reported. Any vitals not documented were not able to be obtained and vitals that have been documented are patient reported.   Cardiac Risk Factors include: advanced age (>67men, >27 women);diabetes mellitus;dyslipidemia;hypertension     Objective:    Today's Vitals   12/08/23 0817  Weight: 150 lb (68 kg)  Height: 5\' 1"  (1.549 m)   Body mass index is 28.34 kg/m.     12/08/2023    8:35 AM 02/13/2023   11:09 AM 11/29/2022    9:05 AM 06/01/2022    7:33 PM 11/27/2021   11:28 AM 11/24/2020   11:28 AM 11/24/2019   11:11 AM  Advanced Directives  Does Patient Have a Medical Advance Directive? No No No No No No No  Would patient like information on creating a medical advance directive? No - Patient declined No - Patient declined No - Patient declined No - Patient declined No - Patient declined  Yes (MAU/Ambulatory/Procedural Areas - Information given)    Current Medications (verified) Outpatient Encounter Medications as of 12/08/2023  Medication Sig   gabapentin (NEURONTIN) 300 MG capsule TAKE 1 CAPSULE BY MOUTH TWICE A DAY   glipiZIDE (GLUCOTROL) 5 MG tablet Take 0.5 tablets (2.5 mg total) by mouth 2 (two) times daily before a meal.   glucose blood (ACCU-CHEK GUIDE) test strip USE 1 STRIP TO CHECK BLOOD GLUCOSE ONCE A DAY Dx code: E11.69   losartan (COZAAR) 50 MG tablet TAKE 1  TABLET BY MOUTH EVERY DAY   tiZANidine (ZANAFLEX) 4 MG tablet Take 1 tablet (4 mg total) by mouth every 6 (six) hours as needed for muscle spasms.   traMADol (ULTRAM) 50 MG tablet Take 1 tablet (50 mg total) by mouth 2 (two) times daily.   valACYclovir (VALTREX) 500 MG tablet TAKE 1 TABLET BY MOUTH THREE TIMES A DAY   Vitamin D, Ergocalciferol, (DRISDOL) 1.25 MG (50000 UNIT) CAPS capsule Take 50,000 Units by mouth every 7 (seven) days.   zolpidem (AMBIEN) 10 MG tablet TAKE 1 TABLET BY MOUTH AT BEDTIME AS NEEDED FOR SLEEP.   No facility-administered encounter medications on file as of 12/08/2023.    Allergies (verified) Rosuvastatin, Bee venom, and Lisinopril   History: Past Medical History:  Diagnosis Date   Angioedema 06/08/2017   Diabetes mellitus    GERD (gastroesophageal reflux disease)    Hyperlipidemia    Hypertension    Past Surgical History:  Procedure Laterality Date   ABDOMINAL HYSTERECTOMY  1995   and BSO   APPENDECTOMY     BILATERAL OOPHORECTOMY     BREAST BIOPSY Right 02/15/2021   right breast calcs, x marker, BENIGN BREAST TISSUE WITH FIBROADENOMATOID CHANGES ANDASSOCIATED CALCIFICATIONS. - NEGATIVE FOR ATYPIA AND MALIGNANCY   BUNIONECTOMY     CARPAL TUNNEL RELEASE Right    CESAREAN SECTION  1977 and 1978   Family History  Problem Relation Age of Onset   Stroke Mother    Heart murmur Mother  Mitral valve prolapse Mother    Cancer Father 64       carcinoid syndrome   Stomach cancer Father    Breast cancer Neg Hx    Social History   Socioeconomic History   Marital status: Married    Spouse name: Not on file   Number of children: Not on file   Years of education: Not on file   Highest education level: Not on file  Occupational History   Not on file  Tobacco Use   Smoking status: Never   Smokeless tobacco: Never  Vaping Use   Vaping status: Never Used  Substance and Sexual Activity   Alcohol use: No   Drug use: No   Sexual activity: Not on file   Other Topics Concern   Not on file  Social History Narrative   Married   Social Drivers of Health   Financial Resource Strain: Low Risk  (12/08/2023)   Overall Financial Resource Strain (CARDIA)    Difficulty of Paying Living Expenses: Not hard at all  Food Insecurity: No Food Insecurity (12/08/2023)   Hunger Vital Sign    Worried About Running Out of Food in the Last Year: Never true    Ran Out of Food in the Last Year: Never true  Transportation Needs: No Transportation Needs (12/08/2023)   PRAPARE - Administrator, Civil Service (Medical): No    Lack of Transportation (Non-Medical): No  Physical Activity: Inactive (12/08/2023)   Exercise Vital Sign    Days of Exercise per Week: 0 days    Minutes of Exercise per Session: 0 min  Stress: No Stress Concern Present (12/08/2023)   [employer] of Occupational Health - Occupational Stress Questionnaire    Feeling of Stress : Not at all  Social Connections: Moderately Integrated (12/08/2023)   Social Connection and Isolation Panel [NHANES]    Frequency of Communication with Friends and Family: More than three times a week    Frequency of Social Gatherings with Friends and Family: More than three times a week    Attends Religious Services: More than 4 times per year    Active Member of [employer] or Organizations: No    Attends Engineer, structural: Never    Marital Status: Married    Tobacco Counseling Counseling given: Not Answered   Clinical Intake:  Pre-visit preparation completed: Yes  Pain : No/denies pain     BMI - recorded: 28.34 Nutritional Status: BMI 25 -29 Overweight Nutritional Risks: None Diabetes: Yes CBG done?: No Did pt. bring in CBG monitor from home?: No  How often do you need to have someone help you when you read instructions, pamphlets, or other written materials from your doctor or pharmacy?: 1 - Never  Interpreter Needed?: No  Information entered by :: R. Imraan Wendell  LPN   Activities of Daily Living    12/08/2023    8:22 AM  In your present state of health, do you have any difficulty performing the following activities:  Hearing? 0  Vision? 0  Comment glasses  Difficulty concentrating or making decisions? 1  Walking or climbing stairs? 0  Dressing or bathing? 0  Doing errands, shopping? 0  Preparing Food and eating ? N  Using the Toilet? N  In the past six months, have you accidently leaked urine? Y  Do you have problems with loss of bowel control? Y  Managing your Medications? N  Managing your Finances? N  Housekeeping or managing your Housekeeping? N  Patient Care Team: Sherlene Shams, MD as PCP - General (Internal Medicine) [facility] [place], [place] Lorain Childes, MD as Consulting Physician (Nephrology)  Indicate any recent Medical Services you may have received from other than [facility] providers in the past year (date may be approximate).     Assessment:   This is a routine wellness examination for Barba.  Hearing/Vision screen Hearing Screening - Comments:: No issues Vision Screening - Comments:: glasses   Goals Addressed             This Visit's Progress    Patient Stated       Wants to lose some weight and exercise       Depression Screen    12/08/2023    8:29 AM 09/08/2023    1:37 PM 02/18/2023    3:48 PM 01/06/2023   10:36 AM 12/03/2022    3:06 PM 11/29/2022    9:06 AM 10/04/2022    9:42 AM  PHQ 2/9 Scores  PHQ - 2 Score 0 0 0 0 0 0 0  PHQ- 9 Score 6          Fall Risk    12/08/2023    8:25 AM 09/08/2023    1:37 PM 02/18/2023    3:48 PM 01/06/2023   10:36 AM 12/03/2022    3:06 PM  Fall Risk   Falls in the past year? 0 0 0 0 0  Number falls in past yr: 0 0 0 0   Injury with Fall? 0 0 0 0   Risk for fall due to : No Fall Risks No Fall Risks No Fall Risks No Fall Risks No Fall Risks  Follow up Falls prevention discussed;Falls evaluation completed Falls evaluation completed Falls  evaluation completed Falls evaluation completed Falls evaluation completed    MEDICARE RISK AT HOME: Medicare Risk at Home Any stairs in or around the home?: Yes If so, are there any without handrails?: No Home free of loose throw rugs in walkways, pet beds, electrical cords, etc?: Yes Adequate lighting in your home to reduce risk of falls?: Yes Life alert?: No Use of a cane, walker or w/c?: No Grab bars in the bathroom?: Yes Shower chair or bench in shower?: Yes Elevated toilet seat or a handicapped toilet?: No   Cognitive Function:        12/08/2023    8:36 AM 11/29/2022    9:06 AM 11/24/2020   11:29 AM 11/24/2019   11:16 AM  6CIT Screen  What Year? 0 points 0 points 0 points 0 points  What month? 0 points 0 points 0 points 0 points  What time? 0 points 0 points 0 points 0 points  Count back from 20 0 points 0 points 0 points 0 points  Months in reverse 0 points 0 points 0 points 0 points  Repeat phrase 0 points 0 points 0 points 0 points  Total Score 0 points 0 points 0 points 0 points    Immunizations Immunization History  Administered Date(s) Administered   Fluad Quad(high Dose 65+) 09/03/2019, 10/02/2020, 10/20/2022   Fluad Trivalent(High Dose 65+) 09/08/2023   Hep A / Hep B 07/13/2019, 09/06/2019, 02/11/2020   Influenza Split 10/20/2013   Influenza, High Dose Seasonal PF 09/28/2018, 09/21/2021   Influenza,inj,Quad PF,6+ Mos 08/12/2014, 09/21/2016, 09/08/2017   Influenza-Unspecified 09/08/2012, 09/22/2015   PFIZER(Purple Top)SARS-COV-2 Vaccination 01/01/2020, 01/22/2020, 10/02/2020   Pneumococcal Conjugate-13 08/12/2014   Pneumococcal Polysaccharide-23 09/15/2015   Td 05/01/1998   Tdap 01/10/2014  Zoster Recombinant(Shingrix) 12/21/2018, 02/22/2019    TDAP status: Up to date  Flu Vaccine status: Up to date  Pneumococcal vaccine status: Due, Education has been provided regarding the importance of this vaccine. Advised may receive this vaccine at local  pharmacy or Health Dept. Aware to provide a copy of the vaccination record if obtained from local pharmacy or Health Dept. Verbalized acceptance and understanding.  Covid-19 vaccine status: Information provided on how to obtain vaccines.   Qualifies for Shingles Vaccine? Yes   Zostavax completed No   Shingrix Completed?: Yes  Screening Tests Health Maintenance  Topic Date Due   Pneumonia Vaccine 72+ Years old (3 of 3 - PPSV23 or PCV20) 09/14/2020   OPHTHALMOLOGY EXAM  02/26/2022   COVID-19 Vaccine (4 - 2024-25 season) 08/10/2023   DTaP/Tdap/Td (3 - Td or Tdap) 01/11/2024   Medicare Annual Wellness (AWV)  02/12/2024   HEMOGLOBIN A1C  03/07/2024   MAMMOGRAM  03/18/2024   Diabetic kidney evaluation - eGFR measurement  09/07/2024   Diabetic kidney evaluation - Urine ACR  09/07/2024   FOOT EXAM  09/07/2024   Colonoscopy  09/03/2027   INFLUENZA VACCINE  Completed   DEXA SCAN  Completed   Hepatitis C Screening  Completed   Zoster Vaccines- Shingrix  Completed   HPV VACCINES  Aged Out    Health Maintenance  Health Maintenance Due  Topic Date Due   Pneumonia Vaccine 92+ Years old (3 of 3 - PPSV23 or PCV20) 09/14/2020   OPHTHALMOLOGY EXAM  02/26/2022   COVID-19 Vaccine (4 - 2024-25 season) 08/10/2023    Colorectal cancer screening: Type of screening: Colonoscopy. Completed 08/2022. Repeat every 5 years  Mammogram status: Completed 03/2023. Repeat every year  Bone Density status: Completed 01/2019. Results reflect: Bone density results: OSTEOPENIA. Repeat every 2 years.  Lung Cancer Screening: (Low Dose CT Chest recommended if Age 33-80 years, 20 pack-year currently smoking OR have quit w/in 15years.) does not qualify.     Additional Screening:  Hepatitis C Screening: does qualify; Completed 12/2017  Vision Screening: Recommended annual ophthalmology exams for early detection of glaucoma and other disorders of the eye. Is the patient up to date with their annual eye exam?  Yes   Who is the provider or what is the name of the office in which the patient attends annual eye exams? [facility] Will request records If pt is not established with a provider, would they like to be referred to a provider to establish care? No .   Dental Screening: Recommended annual dental exams for proper oral hygiene  Diabetic Foot Exam: Diabetic Foot Exam: Completed 08/2023  Community Resource Referral / Chronic Care Management: CRR required this visit?  No   CCM required this visit?  No     Plan:     I have personally reviewed and noted the following in the patient's chart:   Medical and social history Use of alcohol, tobacco or illicit drugs  Current medications and supplements including opioid prescriptions. Patient is currently taking opioid prescriptions. Information provided to patient regarding non-opioid alternatives. Patient advised to discuss non-opioid treatment plan with their provider. Functional ability and status Nutritional status Physical activity Advanced directives List of other physicians Hospitalizations, surgeries, and ER visits in previous 12 months Vitals Screenings to include cognitive, depression, and falls Referrals and appointments  In addition, I have reviewed and discussed with patient certain preventive protocols, quality metrics, and best practice recommendations. A written personalized care plan for preventive services as well as general  preventive health recommendations were provided to patient.     Sydell Axon, LPN   [id number]   After Visit Summary: (MyChart) Due to this being a telephonic visit, the after visit summary with patients personalized plan was offered to patient via MyChart   Nurse Notes: None

## 2023-12-30 ENCOUNTER — Other Ambulatory Visit: Payer: Self-pay | Admitting: Internal Medicine

## 2023-12-30 MED ORDER — GABAPENTIN 300 MG PO CAPS: 300.0000 mg | ORAL_CAPSULE | Freq: Two times a day (BID) | ORAL | 1 refills | Status: DC

## 2023-12-30 MED ORDER — ZOLPIDEM TARTRATE 10 MG PO TABS: 10.0000 mg | ORAL_TABLET | Freq: Every evening | ORAL | 1 refills | Status: DC | PRN

## 2023-12-30 NOTE — Telephone Encounter (Signed)
Copied from CRM [id number]. Topic: Clinical - Medication Refill >> Dec 30, 2023  1:22 PM Almira Coaster wrote: Most Recent Primary Care Visit:  Provider: Sydell Axon C  Department: [facility]  Visit Type: MEDICARE AWV, SEQUENTIAL  Date: 12/08/2023  Medication: gabapentin (NEURONTIN) 300 MG capsule  & zolpidem (AMBIEN) 10 MG tablet (she is running low on these medications)  Has the patient contacted their pharmacy? Yes, patient changed pharmacy and they asked that all prescriptions be sent to them.  (Agent: If no, request that the patient contact the pharmacy for the refill. If patient does not wish to contact the pharmacy document the reason why and proceed with request.) (Agent: If yes, when and what did the pharmacy advise?)  Is this the correct pharmacy for this prescription? Yes If no, delete pharmacy and type the correct one.  This is the patient's preferred pharmacy:   [id number] - [address] [place] [address] Phone: [phone] Fax: [phone]    Has the prescription been filled recently? No  Is the patient out of the medication? No  Has the patient been seen for an appointment in the last year OR does the patient have an upcoming appointment? Yes  Can we respond through MyChart? Yes  Agent: Please be advised that Rx refills may take up to 3 business days. We ask that you follow-up with your pharmacy.

## 2024-01-06 DIAGNOSIS — M542 Cervicalgia: Secondary | ICD-10-CM | POA: Diagnosis not present

## 2024-01-06 DIAGNOSIS — M545 Low back pain, unspecified: Secondary | ICD-10-CM | POA: Diagnosis not present

## 2024-01-08 DIAGNOSIS — M545 Low back pain, unspecified: Secondary | ICD-10-CM | POA: Diagnosis not present

## 2024-01-08 DIAGNOSIS — M5416 Radiculopathy, lumbar region: Secondary | ICD-10-CM | POA: Diagnosis not present

## 2024-01-19 DIAGNOSIS — M65331 Trigger finger, right middle finger: Secondary | ICD-10-CM | POA: Diagnosis not present

## 2024-01-19 DIAGNOSIS — M180 Bilateral primary osteoarthritis of first carpometacarpal joints: Secondary | ICD-10-CM | POA: Diagnosis not present

## 2024-01-19 DIAGNOSIS — M65332 Trigger finger, left middle finger: Secondary | ICD-10-CM | POA: Diagnosis not present

## 2024-01-19 DIAGNOSIS — R200 Anesthesia of skin: Secondary | ICD-10-CM | POA: Diagnosis not present

## 2024-01-26 DIAGNOSIS — M545 Low back pain, unspecified: Secondary | ICD-10-CM | POA: Diagnosis not present

## 2024-01-26 DIAGNOSIS — M542 Cervicalgia: Secondary | ICD-10-CM | POA: Diagnosis not present

## 2024-02-09 ENCOUNTER — Other Ambulatory Visit: Payer: Self-pay | Admitting: Internal Medicine

## 2024-02-09 DIAGNOSIS — M545 Low back pain, unspecified: Secondary | ICD-10-CM | POA: Diagnosis not present

## 2024-02-09 DIAGNOSIS — M542 Cervicalgia: Secondary | ICD-10-CM | POA: Diagnosis not present

## 2024-02-09 DIAGNOSIS — Z1231 Encounter for screening mammogram for malignant neoplasm of breast: Secondary | ICD-10-CM

## 2024-02-12 ENCOUNTER — Other Ambulatory Visit: Payer: Self-pay | Admitting: Internal Medicine

## 2024-02-12 ENCOUNTER — Telehealth: Payer: Self-pay | Admitting: Internal Medicine

## 2024-02-12 MED ORDER — ACCU-CHEK GUIDE TEST VI STRP: ORAL_STRIP | 12 refills | Status: DC

## 2024-02-12 NOTE — Telephone Encounter (Signed)
Test strips have been sent to pharmacy ?

## 2024-02-12 NOTE — Telephone Encounter (Signed)
 Copied from CRM [id number]. Topic: Clinical - Medication Refill >> Feb 12, 2024 10:23 AM Dimitri Ped wrote: Most Recent Primary Care Visit:  Provider: Sydell Axon C  Department: [facility]  Visit Type: MEDICARE AWV, SEQUENTIAL  Date: 12/08/2023  Medication: glucose blood (ACCU-CHEK GUIDE) test strip   Has the patient contacted their pharmacy? Yes would have to come through the doctor office (Agent: If no, request that the patient contact the pharmacy for the refill. If patient does not wish to contact the pharmacy document the reason why and proceed with request.) (Agent: If yes, when and what did the pharmacy advise?)  Is this the correct pharmacy for this prescription? Yes If no, delete pharmacy and type the correct one.  This is the patient's preferred pharmacy:  [id number] [place], [place] - [address] [place] [address] Phone: [phone] Fax: [phone]  [facility] [address] [place] - [address] [place] [address] Phone: [phone] Fax: [phone]   Has the prescription been filled recently? No  Is the patient out of the medication? Yes  Has the patient been seen for an appointment in the last year OR does the patient have an upcoming appointment? No  Can we respond through MyChart? Yes  Agent: Please be advised that Rx refills may take up to 3 business days. We ask that you follow-up with your pharmacy.

## 2024-02-17 DIAGNOSIS — M5412 Radiculopathy, cervical region: Secondary | ICD-10-CM | POA: Diagnosis not present

## 2024-02-23 DIAGNOSIS — M542 Cervicalgia: Secondary | ICD-10-CM | POA: Diagnosis not present

## 2024-02-23 DIAGNOSIS — M545 Low back pain, unspecified: Secondary | ICD-10-CM | POA: Diagnosis not present

## 2024-02-25 ENCOUNTER — Other Ambulatory Visit: Payer: Self-pay | Admitting: Internal Medicine

## 2024-02-25 DIAGNOSIS — E1169 Type 2 diabetes mellitus with other specified complication: Secondary | ICD-10-CM

## 2024-03-19 ENCOUNTER — Ambulatory Visit
Admission: RE | Admit: 2024-03-19 | Discharge: 2024-03-19 | Disposition: A | Discharge status: Home / Self Care | Source: Ambulatory Visit | Attending: Internal Medicine | Admitting: Internal Medicine

## 2024-03-19 DIAGNOSIS — Z1231 Encounter for screening mammogram for malignant neoplasm of breast: Secondary | ICD-10-CM | POA: Insufficient documentation

## 2024-03-20 ENCOUNTER — Other Ambulatory Visit: Payer: Self-pay | Admitting: Internal Medicine

## 2024-03-29 DIAGNOSIS — M5416 Radiculopathy, lumbar region: Secondary | ICD-10-CM | POA: Diagnosis not present

## 2024-03-29 DIAGNOSIS — M545 Low back pain, unspecified: Secondary | ICD-10-CM | POA: Diagnosis not present

## 2024-03-30 DIAGNOSIS — E785 Hyperlipidemia, unspecified: Secondary | ICD-10-CM | POA: Diagnosis not present

## 2024-03-30 DIAGNOSIS — I10 Essential (primary) hypertension: Secondary | ICD-10-CM | POA: Diagnosis not present

## 2024-03-30 DIAGNOSIS — R809 Proteinuria, unspecified: Secondary | ICD-10-CM | POA: Diagnosis not present

## 2024-03-30 DIAGNOSIS — N1831 Chronic kidney disease, stage 3a: Secondary | ICD-10-CM | POA: Diagnosis not present

## 2024-03-30 DIAGNOSIS — K219 Gastro-esophageal reflux disease without esophagitis: Secondary | ICD-10-CM | POA: Diagnosis not present

## 2024-03-30 DIAGNOSIS — M5459 Other low back pain: Secondary | ICD-10-CM | POA: Diagnosis not present

## 2024-03-30 DIAGNOSIS — D631 Anemia in chronic kidney disease: Secondary | ICD-10-CM | POA: Diagnosis not present

## 2024-03-30 DIAGNOSIS — E1122 Type 2 diabetes mellitus with diabetic chronic kidney disease: Secondary | ICD-10-CM | POA: Diagnosis not present

## 2024-04-01 DIAGNOSIS — K219 Gastro-esophageal reflux disease without esophagitis: Secondary | ICD-10-CM | POA: Diagnosis not present

## 2024-04-01 DIAGNOSIS — D631 Anemia in chronic kidney disease: Secondary | ICD-10-CM | POA: Diagnosis not present

## 2024-04-01 DIAGNOSIS — M5459 Other low back pain: Secondary | ICD-10-CM | POA: Diagnosis not present

## 2024-04-01 DIAGNOSIS — E785 Hyperlipidemia, unspecified: Secondary | ICD-10-CM | POA: Diagnosis not present

## 2024-04-01 DIAGNOSIS — I10 Essential (primary) hypertension: Secondary | ICD-10-CM | POA: Diagnosis not present

## 2024-04-01 DIAGNOSIS — R809 Proteinuria, unspecified: Secondary | ICD-10-CM | POA: Diagnosis not present

## 2024-04-01 DIAGNOSIS — N1831 Chronic kidney disease, stage 3a: Secondary | ICD-10-CM | POA: Diagnosis not present

## 2024-04-01 DIAGNOSIS — E1122 Type 2 diabetes mellitus with diabetic chronic kidney disease: Secondary | ICD-10-CM | POA: Diagnosis not present

## 2024-04-05 ENCOUNTER — Telehealth: Payer: Self-pay

## 2024-04-05 DIAGNOSIS — I10 Essential (primary) hypertension: Secondary | ICD-10-CM

## 2024-04-05 DIAGNOSIS — E1169 Type 2 diabetes mellitus with other specified complication: Secondary | ICD-10-CM

## 2024-04-05 NOTE — Telephone Encounter (Signed)
 Copied from CRM [id number]. Topic: General - Other >> Apr 05, 2024 10:35 AM Jenice Mitts wrote: Reason for CRM: Patient is calling I because she would like to know if she has any labs that needs to be done at the moment

## 2024-04-05 NOTE — Telephone Encounter (Signed)
 I have pended labs for your approval if needed.

## 2024-04-05 NOTE — Telephone Encounter (Signed)
 LMTCB. Pt is over due for fasting labs. Please schedule a fasting lab appt.

## 2024-04-08 DIAGNOSIS — M25561 Pain in right knee: Secondary | ICD-10-CM | POA: Diagnosis not present

## 2024-04-08 DIAGNOSIS — M1711 Unilateral primary osteoarthritis, right knee: Secondary | ICD-10-CM | POA: Diagnosis not present

## 2024-04-08 DIAGNOSIS — M1990 Unspecified osteoarthritis, unspecified site: Secondary | ICD-10-CM | POA: Diagnosis not present

## 2024-04-13 NOTE — Telephone Encounter (Signed)
 LMTCB. Pt is over due for fasting labs. Please schedule a fasting lab appt.

## 2024-04-15 NOTE — Telephone Encounter (Signed)
 Pt called back and schedule a lab appt for 04/20/2024.

## 2024-04-20 ENCOUNTER — Other Ambulatory Visit (INDEPENDENT_AMBULATORY_CARE_PROVIDER_SITE_OTHER)

## 2024-04-20 DIAGNOSIS — I10 Essential (primary) hypertension: Secondary | ICD-10-CM

## 2024-04-20 DIAGNOSIS — E669 Obesity, unspecified: Secondary | ICD-10-CM | POA: Diagnosis not present

## 2024-04-20 DIAGNOSIS — E785 Hyperlipidemia, unspecified: Secondary | ICD-10-CM

## 2024-04-20 DIAGNOSIS — E1169 Type 2 diabetes mellitus with other specified complication: Secondary | ICD-10-CM

## 2024-04-20 LAB — LIPID PANEL
Cholesterol: 245 mg/dL — ABNORMAL HIGH (ref 0–200)
HDL: 41.5 mg/dL (ref >39.00)
LDL Cholesterol: 131 mg/dL — ABNORMAL HIGH (ref 0–99)
NonHDL: 203.16
Total CHOL/HDL Ratio: 6
Triglycerides: 363 mg/dL — ABNORMAL HIGH (ref 0.0–149.0)
VLDL: 72.6 mg/dL — ABNORMAL HIGH (ref 0.0–40.0)

## 2024-04-20 LAB — MICROALBUMIN / CREATININE URINE RATIO
Creatinine,U: 96.8 mg/dL
Microalb Creat Ratio: 10.8 mg/g (ref 0.0–30.0)
Microalb, Ur: 1 mg/dL (ref 0.0–1.9)

## 2024-04-20 LAB — COMPREHENSIVE METABOLIC PANEL WITH GFR
ALT: 27 U/L (ref 0–35)
AST: 22 U/L (ref 0–37)
Albumin: 4.4 g/dL (ref 3.5–5.2)
Alkaline Phosphatase: 75 U/L (ref 39–117)
BUN: 13 mg/dL (ref 6–23)
CO2: 27 meq/L (ref 19–32)
Calcium: 9.5 mg/dL (ref 8.4–10.5)
Chloride: 102 meq/L (ref 96–112)
Creatinine, Ser: 0.99 mg/dL (ref 0.40–1.20)
GFR: 57.68 mL/min — ABNORMAL LOW (ref >60.00)
Glucose, Bld: 121 mg/dL — ABNORMAL HIGH (ref 70–99)
Potassium: 4.3 meq/L (ref 3.5–5.1)
Sodium: 137 meq/L (ref 135–145)
Total Bilirubin: 0.4 mg/dL (ref 0.2–1.2)
Total Protein: 7.3 g/dL (ref 6.0–8.3)

## 2024-04-20 LAB — HEMOGLOBIN A1C: Hgb A1c MFr Bld: 6.9 % — ABNORMAL HIGH (ref 4.6–6.5)

## 2024-04-20 LAB — LDL CHOLESTEROL, DIRECT: Direct LDL: 142 mg/dL

## 2024-04-21 ENCOUNTER — Ambulatory Visit: Payer: Self-pay | Admitting: Internal Medicine

## 2024-04-28 DIAGNOSIS — M79641 Pain in right hand: Secondary | ICD-10-CM | POA: Diagnosis not present

## 2024-04-28 DIAGNOSIS — M79642 Pain in left hand: Secondary | ICD-10-CM | POA: Diagnosis not present

## 2024-04-28 DIAGNOSIS — M65341 Trigger finger, right ring finger: Secondary | ICD-10-CM | POA: Diagnosis not present

## 2024-04-28 DIAGNOSIS — M65331 Trigger finger, right middle finger: Secondary | ICD-10-CM | POA: Diagnosis not present

## 2024-05-03 ENCOUNTER — Other Ambulatory Visit: Payer: Self-pay | Admitting: Internal Medicine

## 2024-05-04 NOTE — Telephone Encounter (Signed)
 Refilled: 12/30/2023 Last OV: 09/08/2023 Next OV: 06/07/2024

## 2024-06-02 DIAGNOSIS — M5136 Other intervertebral disc degeneration, lumbar region with discogenic back pain only: Secondary | ICD-10-CM | POA: Diagnosis not present

## 2024-06-02 DIAGNOSIS — M542 Cervicalgia: Secondary | ICD-10-CM | POA: Diagnosis not present

## 2024-06-02 DIAGNOSIS — M5416 Radiculopathy, lumbar region: Secondary | ICD-10-CM | POA: Diagnosis not present

## 2024-06-02 DIAGNOSIS — M4156 Other secondary scoliosis, lumbar region: Secondary | ICD-10-CM | POA: Diagnosis not present

## 2024-06-02 DIAGNOSIS — M5459 Other low back pain: Secondary | ICD-10-CM | POA: Diagnosis not present

## 2024-06-07 ENCOUNTER — Ambulatory Visit (INDEPENDENT_AMBULATORY_CARE_PROVIDER_SITE_OTHER): Admitting: Internal Medicine

## 2024-06-07 VITALS — BP 140/82 | HR 71 | Ht 61.0 in | Wt 150.6 lb

## 2024-06-07 DIAGNOSIS — K760 Fatty (change of) liver, not elsewhere classified: Secondary | ICD-10-CM

## 2024-06-07 DIAGNOSIS — K529 Noninfective gastroenteritis and colitis, unspecified: Secondary | ICD-10-CM

## 2024-06-07 DIAGNOSIS — E669 Obesity, unspecified: Secondary | ICD-10-CM

## 2024-06-07 DIAGNOSIS — T466X5A Adverse effect of antihyperlipidemic and antiarteriosclerotic drugs, initial encounter: Secondary | ICD-10-CM | POA: Diagnosis not present

## 2024-06-07 DIAGNOSIS — E1169 Type 2 diabetes mellitus with other specified complication: Secondary | ICD-10-CM | POA: Diagnosis not present

## 2024-06-07 DIAGNOSIS — I10 Essential (primary) hypertension: Secondary | ICD-10-CM

## 2024-06-07 DIAGNOSIS — T464X5S Adverse effect of angiotensin-converting-enzyme inhibitors, sequela: Secondary | ICD-10-CM

## 2024-06-07 DIAGNOSIS — N1831 Chronic kidney disease, stage 3a: Secondary | ICD-10-CM | POA: Diagnosis not present

## 2024-06-07 DIAGNOSIS — E041 Nontoxic single thyroid nodule: Secondary | ICD-10-CM

## 2024-06-07 DIAGNOSIS — T783XXS Angioneurotic edema, sequela: Secondary | ICD-10-CM | POA: Diagnosis not present

## 2024-06-07 DIAGNOSIS — M5126 Other intervertebral disc displacement, lumbar region: Secondary | ICD-10-CM

## 2024-06-07 DIAGNOSIS — M791 Myalgia, unspecified site: Secondary | ICD-10-CM

## 2024-06-07 DIAGNOSIS — E785 Hyperlipidemia, unspecified: Secondary | ICD-10-CM | POA: Diagnosis not present

## 2024-06-07 MED ORDER — EZETIMIBE 10 MG PO TABS: 10.0000 mg | ORAL_TABLET | Freq: Every day | ORAL | 2 refills | Status: DC

## 2024-06-07 MED ORDER — LOSARTAN POTASSIUM 50 MG PO TABS: 50.0000 mg | ORAL_TABLET | Freq: Every day | ORAL | 1 refills | Status: DC

## 2024-06-07 MED ORDER — GLIPIZIDE 5 MG PO TABS: 2.5000 mg | ORAL_TABLET | Freq: Two times a day (BID) | ORAL | 1 refills | Status: DC

## 2024-06-07 MED ORDER — LOSARTAN POTASSIUM 100 MG PO TABS: 100.0000 mg | ORAL_TABLET | Freq: Every day | ORAL | 2 refills | Status: DC

## 2024-06-07 NOTE — Patient Instructions (Addendum)
 1)  I  AGREE WITH A 2ND OPINION ON THE  NEED FOR SURGERY.  REFERRAL TO [facility] ,  NEUROSURGEON   2) for your pain:  You can add up to 3000 mg of acetominophen (tylenol ) every day safely  In divided doses ( 1000 mg every 8 TO 12 hours.)   OK TO INCREASE THE GABAPENTIN  AS NEEDED ,  EITHER 300 MG THREE TIMES DAILY   OR 300  MORNING  AND EVENING AND 600 AT NIGHT   OK TO ADD A TRAMADOL  AT DINNER TIME IF NEEDED    3)  Your cholesterol is too high.  Recommending a TRIAL OF ZETIA TO LOWER LDL .  GOAL IS 70 OR LESS FOR LDL  IF zetia is TOO $$$  OR IF NOT TOLERATED,   CONTACT ME    4) BP is too high .  INCREASE  DOSE OF LOSARTAN  TO 100 MG AT BEDTIME .  FOR GOAL BP 120/70.  IF NOT TOLERATED. LET ME KNOW VIA MYCHART

## 2024-06-07 NOTE — Progress Notes (Unsigned)
 Subjective:  Patient ID: Sara Mills, female    DOB: 08-05-1951  Age: 73 y.o. MRN: [id number]  CC: The primary encounter diagnosis was Essential hypertension. Diagnoses of Hyperlipidemia due to type 2 diabetes mellitus (HCC), Type 2 diabetes mellitus with obesity (HCC), Hyperlipidemia associated with type 2 diabetes mellitus (HCC), THYROID  NODULE, Stage 3a chronic kidney disease (HCC), Postprandial diarrhea, Myalgia due to statin, Low back pain due to displacement of intervertebral disc, Hepatic steatosis, and Angiotensin converting enzyme inhibitor-aggravated angioedema, sequela were also pertinent to this visit.   HPI Sara Mills presents for  Chief Complaint  Patient presents with   Medical Management of Chronic Issues    1) TYPE 2 DM  WITH HYPERLIPIDEMIA :   NO longer taking  METFORMIN  OR STATIN DUE TO  uncontrolled diarrhea and statin myalgia , respectively .  Taking glipizide    Does not check BS.  Not exercising due to right sided sciatica    2) chronic back pain radiating to right hip.  Starting PT prescribed by [facility],  with follow up  OF lumbar spine  planned in July by br. Brooks.  She is requesting a  2ND OPINION  prior to undergoing surgery. .  And s requesting a referral to neurosurgeon  CHESTER YARBROUGH.  AFTER THE MRI IS REPEATED.   She is currently prescribed  GABAPENTIN  300 MG BID  and combining with tylenol  but pain is not controlled.  She is avoiding regular use of tramadol  (last prescribed in 2023) but tolerates the medication   3) Hypertension: patient has not been checking  blood pressure more than once monthly  at home.  Readings have been for the most part >130/80 at rest . Patient is following a reduced salt diet most days and is taking medications as prescribed (losartan  50 mg daily).  Prior use of 100 mg dose caused hypotension; prior use of hydrochlorothiazide  lowered her GFR   4) Insomnia:  using ambien  nightly for insomnia   Outpatient Medications  Prior to Visit  Medication Sig Dispense Refill   ACCU-CHEK GUIDE TEST test strip USE TO CHECK BLOOD SUGARS ONCE DAILY 100 strip 12   gabapentin  (NEURONTIN ) 300 MG capsule TAKE 1 CAPSULE BY MOUTH TWICE A DAY 120 capsule 1   glucose blood (ACCU-CHEK GUIDE) test strip USE 1 STRIP TO CHECK BLOOD GLUCOSE ONCE A DAY Dx code: E11.69 50 strip 1   tiZANidine  (ZANAFLEX ) 4 MG tablet Take 1 tablet (4 mg total) by mouth every 6 (six) hours as needed for muscle spasms. 90 tablet 1   traMADol  (ULTRAM ) 50 MG tablet Take 1 tablet (50 mg total) by mouth 2 (two) times daily. 60 tablet 5   valACYclovir  (VALTREX ) 500 MG tablet TAKE 1 TABLET BY MOUTH THREE TIMES A DAY 21 tablet 2   Vitamin D , Ergocalciferol , (DRISDOL) 1.25 MG (50000 UNIT) CAPS capsule Take 50,000 Units by mouth every 7 (seven) days.     zolpidem  (AMBIEN ) 10 MG tablet TAKE 1 TABLET (10 MG TOTAL) BY MOUTH AT BEDTIME AS NEEDED FOR SLEEP. FOR SLEEP 30 tablet 2   glipiZIDE  (GLUCOTROL ) 5 MG tablet Take 0.5 tablets (2.5 mg total) by mouth 2 (two) times daily before a meal. 90 tablet 1   losartan  (COZAAR ) 50 MG tablet TAKE 1 TABLET BY MOUTH EVERY DAY 90 tablet 0   No facility-administered medications prior to visit.    Review of Systems;  Patient denies headache, fevers, malaise, unintentional weight loss, skin rash, eye pain, sinus congestion and  sinus pain, sore throat, dysphagia,  hemoptysis , cough, dyspnea, wheezing, chest pain, palpitations, orthopnea, edema, abdominal pain, nausea, melena, diarrhea, constipation, flank pain, dysuria, hematuria, urinary  Frequency, nocturia, numbness, tingling, seizures,  Focal weakness, Loss of consciousness,  Tremor, insomnia, depression, anxiety, and suicidal ideation.      Objective:  BP (!) 140/82   Pulse 71   Ht 5' 1 (1.549 m)   Wt 150 lb 9.6 oz (68.3 kg)   SpO2 97%   BMI 28.46 kg/m   BP Readings from Last 3 Encounters:  06/07/24 (!) 140/82  09/08/23 124/76  02/18/23 136/82    Wt Readings from  Last 3 Encounters:  06/07/24 150 lb 9.6 oz (68.3 kg)  12/08/23 150 lb (68 kg)  09/08/23 144 lb 9.6 oz (65.6 kg)    Physical Exam Vitals reviewed.  Constitutional:      General: She is not in acute distress.    Appearance: Normal appearance. She is normal weight. She is not ill-appearing, toxic-appearing or diaphoretic.  HENT:     Head: Normocephalic.   Eyes:     General: No scleral icterus.       Right eye: No discharge.        Left eye: No discharge.     Conjunctiva/sclera: Conjunctivae normal.    Cardiovascular:     Rate and Rhythm: Normal rate and regular rhythm.     Heart sounds: Normal heart sounds.  Pulmonary:     Effort: Pulmonary effort is normal. No respiratory distress.     Breath sounds: Normal breath sounds.   Musculoskeletal:        General: Normal range of motion.   Skin:    General: Skin is warm and dry.   Neurological:     General: No focal deficit present.     Mental Status: She is alert and oriented to person, place, and time. Mental status is at baseline.   Psychiatric:        Mood and Affect: Mood normal.        Behavior: Behavior normal.        Thought Content: Thought content normal.        Judgment: Judgment normal.     Lab Results  Component Value Date   HGBA1C 6.9 (H) 04/20/2024   HGBA1C 7.5 (H) 09/08/2023   HGBA1C 7.1 (H) 01/06/2023    Lab Results  Component Value Date   CREATININE 0.99 04/20/2024   CREATININE 0.91 09/08/2023   CREATININE 1.04 02/18/2023    Lab Results  Component Value Date   WBC 8.6 09/08/2023   HGB 13.1 09/08/2023   HCT 40.3 09/08/2023   PLT 151.0 09/08/2023   GLUCOSE 121 (H) 04/20/2024   CHOL 245 (H) 04/20/2024   TRIG 363.0 (H) 04/20/2024   HDL 41.50 04/20/2024   LDLDIRECT 142.0 04/20/2024   LDLCALC 131 (H) 04/20/2024   ALT 27 04/20/2024   AST 22 04/20/2024   NA 137 04/20/2024   K 4.3 04/20/2024   CL 102 04/20/2024   CREATININE 0.99 04/20/2024   BUN 13 04/20/2024   CO2 27 04/20/2024   TSH  1.00 09/08/2023   INR 1.1 06/02/2022   HGBA1C 6.9 (H) 04/20/2024   MICROALBUR 1.0 04/20/2024    MM 3D SCREENING MAMMOGRAM BILATERAL BREAST Result Date: 03/24/2024 CLINICAL DATA:  Screening. EXAM: DIGITAL SCREENING BILATERAL MAMMOGRAM WITH TOMOSYNTHESIS AND CAD TECHNIQUE: Bilateral screening digital craniocaudal and mediolateral oblique mammograms were obtained. Bilateral screening digital breast tomosynthesis was performed. The images were evaluated  with computer-aided detection. COMPARISON:  Previous exam(s). ACR Breast Density Category b: There are scattered areas of fibroglandular density. FINDINGS: There are no findings suspicious for malignancy. IMPRESSION: No mammographic evidence of malignancy. A result letter of this screening mammogram will be mailed directly to the patient. RECOMMENDATION: Screening mammogram in one year. (Code:[id number]-B-01Y) BI-RADS CATEGORY  1: Negative. Electronically Signed   By: Toribio Agreste M.D.   On: 03/24/2024 08:56    Assessment & Plan:  .Essential hypertension Assessment & Plan: Increasing losartan  to 100 mg daily for goal 120/70  Orders: -     Comprehensive metabolic panel with GFR; Future  Hyperlipidemia due to type 2 diabetes mellitus (HCC) Assessment & Plan: Currently well-controlled on . glipizide  only.  hemoglobin A1c is at goal of less than 7.0 . Patient is reminded to schedule an annual eye exam and foot exam is normal today. Patient has no microalbuminuria. Patient is intolerant of statin therapy for CAD risk reduction  but willing to undergo trial of Zetia and willing to increase losartan  to 100 mg daily for goal BP 120/70 Trial of zetia commenced  Orders: -     Lipid panel; Future -     LDL cholesterol, direct; Future  Type 2 diabetes mellitus with obesity [facility]) Assessment & Plan: Last seen in September 2024/    Orders: -     Comprehensive metabolic panel with GFR; Future -     Hemoglobin A1c; Future  Hyperlipidemia associated with type 2  diabetes mellitus [facility]) Assessment & Plan: Last seen in September 2024/     THYROID  NODULE Assessment & Plan: Thyroid   calcs  were noted by orthopedics on cervical spine films recently. She has a known nontoxic MNG that was followed with serial imaging for years by ENT(last one in chart 2012)   Given her normal thyroid  levels,  No further workup needed     Lab Results  Component Value Date   TSH 1.00 09/08/2023     Orders: -     TSH; Future  Stage 3a chronic kidney disease (HCC) Assessment & Plan:  Stable,  multifactorial  Due to DM,  HTN and NSAID use. GFR improved after stopping hydrochlorothiazide . Nephrology  follow up reviewed  with Korapati ;  continue ARB but increase to 100 mg for goal BP 120/70.    Lab Results  Component Value Date   CREATININE 0.99 04/20/2024      Postprandial diarrhea Assessment & Plan: normal colonoscopy in 2023 .  5H!AA 24 hour urine was normal. . Symptoms have  resolved  with suspension of metformin    Myalgia due to statin Assessment & Plan: She is willing to try Zetia to lower LDL    Low back pain due to displacement of intervertebral disc Assessment & Plan: With right sided sciatica reported.  Seeing Orthoped[facility] [place] ([facility])  Currently receiving PT with plans for repeat MRI lumbar spine in July.  Referring to Reeves Daisy for second opinion .  Recommend increasing gabapentin  to 300 mg tid.  Tylenol  2000-3000 mg daily..  tramadol  has not been refilled since 2023 but she has been authorized to use it prn at dinnertime but not later if using ambien  at bedtime   Orders: -     Ambulatory referral to Neurosurgery  Hepatic steatosis Assessment & Plan: Noted on 2020 ultrasound,  Tno longer taking metformin   or  rosuvastatin  . LFTS normal  Lab Results  Component Value Date   ALT 27 04/20/2024   AST 22 04/20/2024  ALKPHOS 75 04/20/2024   BILITOT 0.4 04/20/2024      Angiotensin converting enzyme inhibitor-aggravated  angioedema, sequela Assessment & Plan: She has been tolerant of ARB    Other orders -     glipiZIDE ; Take 0.5 tablets (2.5 mg total) by mouth 2 (two) times daily before a meal.  Dispense: 90 tablet; Refill: 1 -     Ezetimibe; Take 1 tablet (10 mg total) by mouth daily.  Dispense: 30 tablet; Refill: 2 -     Losartan  Potassium; Take 1 tablet (100 mg total) by mouth daily.  Dispense: 30 tablet; Refill: 2     I spent 34 minutes on the day of this face to face encounter reviewing patient's  most recent visit with  orthopedics,  sportes medicine and nephrology, ,  prior relevant surgical and non surgical procedures, recent  labs and imaging studies, counseling on hypertension management,  reviewing the assessment and plan with patient, and post visit ordering and reviewing of  diagnostics and therapeutics with patient  .   Follow-up: Return in about 18 weeks (around 10/11/2024) for follow up diabetes.   Verneita LITTIE Kettering, MD

## 2024-06-07 NOTE — Assessment & Plan Note (Signed)
 Increasing losartan  to 100 mg daily for goal 120/70

## 2024-06-07 NOTE — Assessment & Plan Note (Signed)
 Trial of zetia recommended

## 2024-06-07 NOTE — Assessment & Plan Note (Signed)
 Last seen in September 2024/

## 2024-06-08 NOTE — Assessment & Plan Note (Addendum)
 With right sided sciatica reported.  Seeing Orthopedics [place] ([facility])  Currently receiving PT with plans for repeat MRI lumbar spine in July.  Referring to Sara Mills for second opinion .  Recommend increasing gabapentin  to 300 mg tid.  Tylenol  2000-3000 mg daily..  tramadol  has not been refilled since 2023 but she has been authorized to use it prn at dinnertime but not later if using ambien  at bedtime

## 2024-06-08 NOTE — Assessment & Plan Note (Signed)
 Thyroid   calcs  were noted by orthopedics on cervical spine films recently. She has a known nontoxic MNG that was followed with serial imaging for years by ENT(last one in chart 2012)   Given her normal thyroid  levels,  No further workup needed     Lab Results  Component Value Date   TSH 1.00 09/08/2023

## 2024-06-08 NOTE — Assessment & Plan Note (Signed)
 Noted on 2020 ultrasound,  Tno longer taking metformin   or  rosuvastatin  . LFTS normal  Lab Results  Component Value Date   ALT 27 04/20/2024   AST 22 04/20/2024   ALKPHOS 75 04/20/2024   BILITOT 0.4 04/20/2024

## 2024-06-08 NOTE — Assessment & Plan Note (Signed)
 normal colonoscopy in 2023 .  5H!AA 24 hour urine was normal. . Symptoms have  resolved  with suspension of metformin 

## 2024-06-08 NOTE — Assessment & Plan Note (Signed)
 She is willing to try Zetia to lower LDL

## 2024-06-08 NOTE — Assessment & Plan Note (Signed)
 She has been tolerant of ARB

## 2024-06-08 NOTE — Assessment & Plan Note (Addendum)
 Stable,  multifactorial  Due to DM,  HTN and NSAID use. GFR improved after stopping hydrochlorothiazide . Nephrology  follow up reviewed  with Korapati ;  continue ARB but increase to 100 mg for goal BP 120/70.    Lab Results  Component Value Date   CREATININE 0.99 04/20/2024

## 2024-06-15 DIAGNOSIS — H2513 Age-related nuclear cataract, bilateral: Secondary | ICD-10-CM | POA: Diagnosis not present

## 2024-06-15 DIAGNOSIS — H5203 Hypermetropia, bilateral: Secondary | ICD-10-CM | POA: Diagnosis not present

## 2024-06-15 DIAGNOSIS — H52203 Unspecified astigmatism, bilateral: Secondary | ICD-10-CM | POA: Diagnosis not present

## 2024-06-15 DIAGNOSIS — H25012 Cortical age-related cataract, left eye: Secondary | ICD-10-CM | POA: Diagnosis not present

## 2024-06-15 DIAGNOSIS — H524 Presbyopia: Secondary | ICD-10-CM | POA: Diagnosis not present

## 2024-06-15 DIAGNOSIS — E119 Type 2 diabetes mellitus without complications: Secondary | ICD-10-CM | POA: Diagnosis not present

## 2024-06-15 LAB — HM DIABETES EYE EXAM

## 2024-06-16 DIAGNOSIS — M5416 Radiculopathy, lumbar region: Secondary | ICD-10-CM | POA: Diagnosis not present

## 2024-06-25 ENCOUNTER — Other Ambulatory Visit: Payer: Self-pay | Admitting: Internal Medicine

## 2024-06-25 DIAGNOSIS — M5416 Radiculopathy, lumbar region: Secondary | ICD-10-CM | POA: Diagnosis not present

## 2024-06-28 ENCOUNTER — Other Ambulatory Visit: Payer: Self-pay | Admitting: Internal Medicine

## 2024-06-28 ENCOUNTER — Ambulatory Visit: Payer: Self-pay

## 2024-06-28 NOTE — Telephone Encounter (Signed)
 FYI Only or Action Required?: Action required by provider: medication refill request and clinical question for provider.  Patient was last seen in primary care on 06/07/2024 by Marylynn Verneita CROME, MD.  Called Nurse Triage reporting Advice Only.  Symptoms began N/A.  Interventions attempted: Prescription medications: tramadol .  Symptoms are: N/A.  Triage Disposition: Call PCP When Office is Open  Patient/caregiver understands and will follow disposition?: Yes                             Copied from CRM [id number]. Topic: Clinical - Medication Question >> Jun 28, 2024 11:19 AM Macario HERO wrote: Reason for CRM: Patient stated she takes traMADol  (ULTRAM ) 50 MG tablet [[id number]] for back pain and wants to know how much of the medication she can take during her travel? Reason for Disposition  [1] Caller requesting NON-URGENT health information AND [2] PCP's office is the best resource  Answer Assessment - Initial Assessment Questions 1. REASON FOR CALL: What is the main reason for your call? or How can I best help you?     Patient stated she has started taking tramadol  again for her back pain. Patient stated the tramadol  is helping significantly. Patient is leaving for a trip on Thursday and would like provider's input on maximum amount of tramadol  she can take. This RN advised patient to take 1 tablet bid, as prescribed. Patient would like to know if she can take more than this, if needed. Please advise. Patient is also requesting medication to be refilled by Wednesday night.  Protocols used: Information Only Call - No Triage-A-AH

## 2024-06-28 NOTE — Telephone Encounter (Unsigned)
 Copied from CRM [id number]. Topic: Clinical - Medication Refill >> Jun 28, 2024 11:21 AM Macario HERO wrote: Medication: traMADol  (ULTRAM ) 50 MG tablet [[id number]]  Has the patient contacted their pharmacy? No (Agent: If no, request that the patient contact the pharmacy for the refill. If patient does not wish to contact the pharmacy document the reason why and proceed with request.) (Agent: If yes, when and what did the pharmacy advise?)  This is the patient's preferred pharmacy:  [id number] [address] [facility] - [address] [place] [address] Phone: [phone] Fax: [phone]   Is this the correct pharmacy for this prescription? Yes If no, delete pharmacy and type the correct one.   Has the prescription been filled recently? No  Is the patient out of the medication? No, will not have enough for travel  Has the patient been seen for an appointment in the last year OR does the patient have an upcoming appointment? Yes  Can we respond through MyChart? Yes  Agent: Please be advised that Rx refills may take up to 3 business days. We ask that you follow-up with your pharmacy.

## 2024-06-28 NOTE — Telephone Encounter (Signed)
 Pt called requesting a refill for her tramadol  before she leaves for vacation on Thursday. Pt would also like to know the maximum amount she can take in a day. E2C2 nurse advised her per directions that she can take 1 tablet twice daily. Pt is wanting to know if she can take more than that if needed.

## 2024-06-29 ENCOUNTER — Other Ambulatory Visit: Payer: Self-pay | Admitting: Internal Medicine

## 2024-06-29 MED ORDER — TRAMADOL HCL 50 MG PO TABS: 50.0000 mg | ORAL_TABLET | Freq: Three times a day (TID) | ORAL | 1 refills | Status: AC | PRN

## 2024-06-30 DIAGNOSIS — M5416 Radiculopathy, lumbar region: Secondary | ICD-10-CM | POA: Diagnosis not present

## 2024-07-19 ENCOUNTER — Other Ambulatory Visit: Payer: Self-pay | Admitting: Family Medicine

## 2024-07-19 ENCOUNTER — Inpatient Hospital Stay
Admission: RE | Admit: 2024-07-19 | Discharge: 2024-07-19 | Disposition: A | Discharge status: Home / Self Care | Payer: Self-pay | Source: Ambulatory Visit | Attending: Neurosurgery | Admitting: Neurosurgery

## 2024-07-19 DIAGNOSIS — Z049 Encounter for examination and observation for unspecified reason: Secondary | ICD-10-CM

## 2024-07-26 DIAGNOSIS — M5416 Radiculopathy, lumbar region: Secondary | ICD-10-CM | POA: Diagnosis not present

## 2024-07-29 NOTE — Progress Notes (Signed)
 Referring Physician:  Marylynn Verneita CROME, MD [address] [place],  [place] [address]  Primary Physician:  Marylynn Verneita CROME, MD  History of Present Illness: 08/03/2024 Sara Mills is here today with a chief complaint of low back pain radiating to the right hip.  This has been ongoing for some time.  She has tried multiple different treatments over the time.  She has been seen by an outside physician who recommended considering surgical intervention.  She is here today for reevaluation.  She has also had pain in her neck, shoulder blades, and arms ongoing for more than a year.  She has suffered from problems with her balance.  She has not had any falls, but has continued issues with numbness down her right arm as well as imbalance.   Bowel/Bladder Dysfunction: none  Conservative measures:  Physical therapy:  has participated in PT at [facility] including regular antiinflammatories: Gabapentin , Diclofenac , Hydrocodone, Tylenol  Injections: 08/27/2022 L2-3 ESI 08/06/2022 L5-S1 ESI 06/28/2022 C7-T1 ESI 04/10/2021 epidural steroid injections  Past Surgery: none  Sara Mills has some symptoms of cervical myelopathy.  The symptoms are causing a significant impact on the patient's life.   I have utilized the care everywhere function in epic to review the outside records available from external health systems.   Review of Systems:  A 10 point review of systems is negative, except for the pertinent positives and negatives detailed in the HPI.  Past Medical History: Past Medical History:  Diagnosis Date   Angioedema 06/08/2017   Arthritis    Diabetes mellitus    GERD (gastroesophageal reflux disease)    Hyperlipidemia    Hypertension     Past Surgical History: Past Surgical History:  Procedure Laterality Date   ABDOMINAL HYSTERECTOMY  1995   and BSO   APPENDECTOMY     BILATERAL OOPHORECTOMY     BREAST BIOPSY Right 02/15/2021   right  breast calcs, x marker, BENIGN BREAST TISSUE WITH FIBROADENOMATOID CHANGES ANDASSOCIATED CALCIFICATIONS. - NEGATIVE FOR ATYPIA AND MALIGNANCY   BUNIONECTOMY     CARPAL TUNNEL RELEASE Right    CESAREAN SECTION  1977 and 1978    Allergies: Allergies as of 08/03/2024 - Review Complete 08/03/2024  Allergen Reaction Noted   Rosuvastatin   09/22/2023   Bee venom  05/20/2022   Lisinopril   06/08/2017    Medications:  Current Outpatient Medications:    ACCU-CHEK GUIDE TEST test strip, USE TO CHECK BLOOD SUGARS ONCE DAILY, Disp: 100 strip, Rfl: 12   ezetimibe  (ZETIA ) 10 MG tablet, TAKE 1 TABLET BY MOUTH EVERY DAY, Disp: 90 tablet, Rfl: 1   gabapentin  (NEURONTIN ) 300 MG capsule, TAKE 1 CAPSULE BY MOUTH TWICE A DAY, Disp: 120 capsule, Rfl: 1   glipiZIDE  (GLUCOTROL ) 5 MG tablet, Take 0.5 tablets (2.5 mg total) by mouth 2 (two) times daily before a meal., Disp: 90 tablet, Rfl: 1   glucose blood (ACCU-CHEK GUIDE) test strip, USE 1 STRIP TO CHECK BLOOD GLUCOSE ONCE A DAY Dx code: E11.69, Disp: 50 strip, Rfl: 1   losartan  (COZAAR ) 100 MG tablet, Take 1 tablet (100 mg total) by mouth daily., Disp: 30 tablet, Rfl: 2   traMADol  (ULTRAM ) 50 MG tablet, Take 1 tablet (50 mg total) by mouth every 8 (eight) hours as needed., Disp: 90 tablet, Rfl: 1   valACYclovir  (VALTREX ) 500 MG tablet, TAKE 1 TABLET BY MOUTH THREE TIMES A DAY, Disp: 21 tablet, Rfl: 2   Vitamin D , Ergocalciferol , (DRISDOL) 1.25  MG (50000 UNIT) CAPS capsule, Take 50,000 Units by mouth every 7 (seven) days., Disp: , Rfl:    zolpidem  (AMBIEN ) 10 MG tablet, TAKE 1 TABLET (10 MG TOTAL) BY MOUTH AT BEDTIME AS NEEDED FOR SLEEP. FOR SLEEP, Disp: 30 tablet, Rfl: 2   tiZANidine  (ZANAFLEX ) 4 MG tablet, Take 1 tablet (4 mg total) by mouth every 6 (six) hours as needed for muscle spasms. (Patient not taking: Reported on 08/03/2024), Disp: 90 tablet, Rfl: 1  Social History: Social History   Tobacco Use   Smoking status: Never   Smokeless tobacco: Never   Vaping Use   Vaping status: Never Used  Substance Use Topics   Alcohol use: No   Drug use: No    Family Medical History: Family History  Problem Relation Age of Onset   Stroke Mother    Heart murmur Mother    Mitral valve prolapse Mother    Arthritis Mother    Heart disease Mother    Miscarriages / Stillbirths Mother    Varicose Veins Mother    Cancer Father 47       carcinoid syndrome   Stomach cancer Father    Diabetes Maternal Grandfather    Arthritis Paternal Grandmother    Asthma Brother    Breast cancer Neg Hx     Physical Examination: Vitals:   08/03/24 1348  BP: (!) 142/80    General: Patient is in no apparent distress. Attention to examination is appropriate.  Neck:   Supple.  Full range of motion.  Respiratory: Patient is breathing without any difficulty.   NEUROLOGICAL:     Awake, alert, oriented to person, place, and time.  Speech is clear and fluent.   Cranial Nerves: Pupils equal round and reactive to light.  Facial tone is symmetric.  Facial sensation is symmetric. Shoulder shrug is symmetric. Tongue protrusion is midline.  There is no pronator drift.  Strength: Side Biceps Triceps Deltoid Interossei Grip Wrist Ext. Wrist Flex.  R 5 5 5 5 5 5 5   L 5 5 5 5 5 5 5    Side Iliopsoas Quads Hamstring PF DF EHL  R 5 5 5 5 5 5   L 5 5 5 5 5 5    Reflexes are 1+ and symmetric at the biceps, triceps, brachioradialis, patella and achilles.   Hoffman's is present.   Bilateral upper and lower extremity sensation is intact to light touch with exception of R arm with R C6 and C7 diminished sensation.    No evidence of dysmetria noted.    Medical Decision Making  Imaging: MRI cervical spine on December 08, 2023 shows large disc herniation at C5-6 with severe bilateral foraminal stenosis and moderate central stenosis.  At C6-7, she has moderate to severe bilateral uncovertebral hypertrophy with mild to moderate central stenosis and moderate right and mild  left foraminal stenosis.  MRI lumbar spine on January 08, 2024 shows prominent progression of T12-L1 to space narrowing with Modic 1 prominent endplate marrow edema without changes of discitis osteomyelitis.  At L2-3 she has moderate right and mild left subarticular narrowing.  At L4-5, she has moderate left subarticular narrowing.  I have personally reviewed the images and agree with the above interpretation.  Assessment and Plan: Sara Mills is a pleasant 73 y.o. female with evidence of sacroiliac pain on the right side as well as low back pain with right-sided sciatica.  She additionally cervical radiculopathy.  For her neck, she has had symptoms for quite some time.  She has some grip weakness and some overt signs and symptoms of cervical radiculopathy as well as imbalance suggesting myelopathy.  She has severe foraminal stenosis C5-6 and C6-7.  I have recommended anterior cervical discectomy and fusion at C5-7.  She would like to consider this.  I discussed the planned procedure at length with the patient, including the risks, benefits, alternatives, and indications. The risks discussed include but are not limited to bleeding, infection, need for reoperation, spinal fluid leak, stroke, vision loss, anesthetic complication, coma, paralysis, and even death. We also discussed the possibility of post-operative dysphagia, vocal cord paralysis, and the risk of adjacent segment disease in the future. I also described in detail that improvement was not guaranteed.  The patient expressed understanding of these risks. I described the surgery in layman's terms, and gave ample opportunity for questions, which were answered to the best of my ability.  She has some element of right sided lower back pain with right-sided sciatica.  Some of this could be due to sacroiliac dysfunction.  I will send her for an SI joint injection.  There is some concern that she may have scoliosis.  I will send over scoliosis  x-rays.  She will think about the possibility of neck surgery.  She will let me know when she is made a decision.       Thank you for involving me in the care of this patient.      Leveon Pelzer K. Clois MD, [facility] Neurosurgery

## 2024-07-30 ENCOUNTER — Other Ambulatory Visit: Payer: Self-pay | Admitting: Internal Medicine

## 2024-08-03 ENCOUNTER — Encounter: Payer: Self-pay | Admitting: Neurosurgery

## 2024-08-03 ENCOUNTER — Ambulatory Visit
Admission: RE | Admit: 2024-08-03 | Discharge: 2024-08-03 | Disposition: A | Discharge status: Home / Self Care | Attending: Neurosurgery | Admitting: Neurosurgery

## 2024-08-03 ENCOUNTER — Ambulatory Visit
Admission: RE | Admit: 2024-08-03 | Discharge: 2024-08-03 | Disposition: A | Discharge status: Home / Self Care | Source: Ambulatory Visit | Attending: Neurosurgery | Admitting: Neurosurgery

## 2024-08-03 ENCOUNTER — Ambulatory Visit (INDEPENDENT_AMBULATORY_CARE_PROVIDER_SITE_OTHER): Admitting: Neurosurgery

## 2024-08-03 VITALS — BP 142/80 | Ht 61.0 in | Wt 150.0 lb

## 2024-08-03 DIAGNOSIS — M533 Sacrococcygeal disorders, not elsewhere classified: Secondary | ICD-10-CM | POA: Insufficient documentation

## 2024-08-03 DIAGNOSIS — G959 Disease of spinal cord, unspecified: Secondary | ICD-10-CM

## 2024-08-03 DIAGNOSIS — M4155 Other secondary scoliosis, thoracolumbar region: Secondary | ICD-10-CM

## 2024-08-03 DIAGNOSIS — M4312 Spondylolisthesis, cervical region: Secondary | ICD-10-CM | POA: Diagnosis not present

## 2024-08-03 DIAGNOSIS — M5412 Radiculopathy, cervical region: Secondary | ICD-10-CM

## 2024-08-03 DIAGNOSIS — M5441 Lumbago with sciatica, right side: Secondary | ICD-10-CM | POA: Diagnosis not present

## 2024-08-03 DIAGNOSIS — G8929 Other chronic pain: Secondary | ICD-10-CM | POA: Insufficient documentation

## 2024-08-03 DIAGNOSIS — M4802 Spinal stenosis, cervical region: Secondary | ICD-10-CM | POA: Diagnosis not present

## 2024-08-03 DIAGNOSIS — M5116 Intervertebral disc disorders with radiculopathy, lumbar region: Secondary | ICD-10-CM | POA: Diagnosis not present

## 2024-08-03 DIAGNOSIS — M8580 Other specified disorders of bone density and structure, unspecified site: Secondary | ICD-10-CM | POA: Diagnosis not present

## 2024-08-06 ENCOUNTER — Other Ambulatory Visit: Payer: Self-pay | Admitting: Internal Medicine

## 2024-08-06 DIAGNOSIS — M5126 Other intervertebral disc displacement, lumbar region: Secondary | ICD-10-CM

## 2024-08-06 MED ORDER — LOSARTAN POTASSIUM 100 MG PO TABS: 100.0000 mg | ORAL_TABLET | Freq: Every day | ORAL | 1 refills | Status: DC

## 2024-08-06 MED ORDER — GABAPENTIN 600 MG PO TABS: 600.0000 mg | ORAL_TABLET | Freq: Two times a day (BID) | ORAL | 0 refills | Status: DC

## 2024-08-06 NOTE — Telephone Encounter (Signed)
 Spoke with pt in regards to her Gabapentin  rx. Pt stated that she is completely out of her medication because she is taking 2 capsules twice daily. Pt is needing this refilled with the updated directions but would like to know if a 600 mg capsule could be sent in to take 1 capsule twice daily instead.

## 2024-08-06 NOTE — Telephone Encounter (Unsigned)
 Copied from CRM #[id number]. Topic: Clinical - Medication Refill >> Aug 06, 2024  8:15 AM Thersia C wrote: Medication: losartan  (COZAAR ) 100 MG tablet gabapentin  (NEURONTIN ) 300 MG capsule  Patient called in stated trying to refill prescription , pharmacy is stated they are too soon to refill them, but patient has been taking two instead of one, wanted to see if she can change the dosage and get an refill on these two medications   Has the patient contacted their pharmacy? Yes (Agent: If no, request that the patient contact the pharmacy for the refill. If patient does not wish to contact the pharmacy document the reason why and proceed with request.) (Agent: If yes, when and what did the pharmacy advise?)  This is the patient's preferred pharmacy:  [id number] [address] - [address] [place] [address] Phone: [phone] Fax: [phone]    Is this the correct pharmacy for this prescription? Yes If no, delete pharmacy and type the correct one.   Has the prescription been filled recently? No  Is the patient out of the medication? Yes  Has the patient been seen for an appointment in the last year OR does the patient have an upcoming appointment? Yes  Can we respond through MyChart? Yes  Agent: Please be advised that Rx refills may take up to 3 business days. We ask that you follow-up with your pharmacy.

## 2024-08-18 ENCOUNTER — Encounter: Payer: Self-pay | Admitting: Neurosurgery

## 2024-08-30 ENCOUNTER — Ambulatory Visit
Admission: RE | Admit: 2024-08-30 | Discharge: 2024-08-30 | Disposition: A | Discharge status: Home / Self Care | Source: Ambulatory Visit | Attending: Emergency Medicine | Admitting: Emergency Medicine

## 2024-08-30 VITALS — BP 151/82 | HR 72 | Temp 98.1°F | Resp 18

## 2024-08-30 DIAGNOSIS — L237 Allergic contact dermatitis due to plants, except food: Secondary | ICD-10-CM

## 2024-08-30 MED ORDER — DEXAMETHASONE SODIUM PHOSPHATE 10 MG/ML IJ SOLN
10.0000 mg | Freq: Once | INTRAMUSCULAR | Status: AC
  Administered 2024-08-30: 10 mg via INTRAMUSCULAR

## 2024-08-30 MED ORDER — PREDNISONE 10 MG (21) PO TBPK: ORAL_TABLET | Freq: Every day | ORAL | 0 refills | Status: DC

## 2024-08-30 NOTE — ED Triage Notes (Signed)
 Patient reports that she was exposed to poison ivy/oak on Thursday. Patient complains itching and mild swelling. Patient denies pain.

## 2024-08-30 NOTE — Discharge Instructions (Signed)
 Today you are being treated for the poison ivy/oak rash  You have been given an injection of steroids today in the office today to help reduce the inflammatory process that occurs with this rash which will help minimize your itching as well as begin to clear  Starting tomorrow take prednisone  every morning with food as directed, to continue the above process  You may continue use of topical calamine or Benadryl cream to help manage itching, you may also continue oral Benadryl  Please avoid long exposures to heat such as a hot steamy shower or being outside as this may cause further irritation to your rash  You may follow-up with his urgent care as needed if symptoms persist or worsen

## 2024-08-31 NOTE — ED Provider Notes (Signed)
 Sara Mills    CSN: [id number] Arrival date & time: 08/30/24  1741      History   Chief Complaint Chief Complaint  Patient presents with   Allergic Reaction    Itching from Poison ivy, cortisone injection - Entered by patient   Poison Ivy    HPI Sara Mills is a 73 y.o. female.   Patient presents for evaluation of erythematous pruritic rash generalized to the body beginning 3 days ago after completing yard work, endorses that she was pulling weeds and believes she came in contact with poison ivy which she has had a reaction to in the past.  Has noticed clear drainage to the right forearm.  Has attempted use of Benadryl which has been minimally helpful.  Denies presence of fever.  Denies change in toiletries diet or recent travel, known contact has similar symptoms.  Past Medical History:  Diagnosis Date   Angioedema 06/08/2017   Arthritis    Diabetes mellitus    GERD (gastroesophageal reflux disease)    Hyperlipidemia    Hypertension     Patient Active Problem List   Diagnosis Date Noted   Myalgia due to statin 09/22/2023   Postprandial diarrhea 09/08/2023   Stage 3a chronic kidney disease (HCC) 05/20/2022   Tinnitus aurium, left 04/04/2022   Breast calcifications on mammogram 02/03/2021   History of pneumonia 10/05/2020   Herpes simplex 05/20/2019   Angiotensin converting enzyme inhibitor-aggravated angioedema 02/20/2019   Hepatic steatosis 02/20/2019   Cervical spondylosis with radiculopathy 11/19/2018   Low back pain due to displacement of intervertebral disc 09/29/2018   Long-term use of high-risk medication 12/20/2017   Encounter for long-term (current) use of other high-risk medications 09/15/2015   Encounter for preventive health examination 01/12/2014   Overweight (BMI 25.0-29.9) 03/22/2012   Premature menopause on hormone replacement therapy 03/20/2012   Essential hypertension    THYROID  NODULE 08/06/2007   COLONIC POLYPS 07/27/2007    Hyperlipidemia due to type 2 diabetes mellitus (HCC) 07/27/2007   GERD 12/09/2004    Past Surgical History:  Procedure Laterality Date   ABDOMINAL HYSTERECTOMY  1995   and BSO   APPENDECTOMY     BILATERAL OOPHORECTOMY     BREAST BIOPSY Right 02/15/2021   right breast calcs, x marker, BENIGN BREAST TISSUE WITH FIBROADENOMATOID CHANGES ANDASSOCIATED CALCIFICATIONS. - NEGATIVE FOR ATYPIA AND MALIGNANCY   BUNIONECTOMY     CARPAL TUNNEL RELEASE Right    CESAREAN SECTION  1977 and 1978    OB History   No obstetric history on file.      Home Medications    Prior to Admission medications   Medication Sig Start Date End Date Taking? Authorizing Provider  predniSONE  (STERAPRED UNI-PAK 21 TAB) 10 MG (21) TBPK tablet Take by mouth daily. Take 6 tabs by mouth daily  for 1 days, then 5 tabs for 1 days, then 4 tabs for 1 days, then 3 tabs for 1 days, 2 tabs for 1 days, then 1 tab by mouth daily for 1 days 08/30/24  Yes Seidy Labreck, Shelba SAUNDERS, NP  ACCU-CHEK GUIDE TEST test strip USE TO CHECK BLOOD SUGARS ONCE DAILY 02/25/24   [name], MD  ezetimibe  (ZETIA ) 10 MG tablet TAKE 1 TABLET BY MOUTH EVERY DAY 07/30/24   [name], MD  gabapentin  (NEURONTIN ) 600 MG tablet Take 1 tablet (600 mg total) by mouth 2 (two) times daily. 08/06/24   [name], NP  glipiZIDE  (GLUCOTROL ) 5 MG tablet Take 0.5 tablets (  2.5 mg total) by mouth 2 (two) times daily before a meal. 06/07/24   Marylynn Verneita CROME, MD  glucose blood (ACCU-CHEK GUIDE) test strip USE 1 STRIP TO CHECK BLOOD GLUCOSE ONCE A DAY Dx code: E11.69 07/15/23   Marylynn Verneita CROME, MD  losartan  (COZAAR ) 100 MG tablet Take 1 tablet (100 mg total) by mouth daily. 08/06/24   Marylynn Verneita CROME, MD  tiZANidine  (ZANAFLEX ) 4 MG tablet Take 1 tablet (4 mg total) by mouth every 6 (six) hours as needed for muscle spasms. Patient not taking: Reported on 08/03/2024 10/04/22   Marylynn Verneita CROME, MD  traMADol  (ULTRAM ) 50 MG tablet Take 1 tablet (50 mg total) by mouth every 8  (eight) hours as needed. 06/29/24 08/28/24  Marylynn Verneita CROME, MD  valACYclovir  (VALTREX ) 500 MG tablet TAKE 1 TABLET BY MOUTH THREE TIMES A DAY 08/06/23   Marylynn Verneita CROME, MD  Vitamin D , Ergocalciferol , (DRISDOL) 1.25 MG (50000 UNIT) CAPS capsule Take 50,000 Units by mouth every 7 (seven) days.    [provider]  zolpidem  (AMBIEN ) 10 MG tablet TAKE 1 TABLET (10 MG TOTAL) BY MOUTH AT BEDTIME AS NEEDED FOR SLEEP. FOR SLEEP 05/04/24   Marylynn Verneita CROME, MD    Family History Family History  Problem Relation Age of Onset   Stroke Mother    Heart murmur Mother    Mitral valve prolapse Mother    Arthritis Mother    Heart disease Mother    Miscarriages / [place] Mother    Varicose Veins Mother    Cancer Father 77       carcinoid syndrome   Stomach cancer Father    Diabetes Maternal Grandfather    Arthritis Paternal Grandmother    Asthma Brother    Breast cancer Neg Hx     Social History Social History   Tobacco Use   Smoking status: Never   Smokeless tobacco: Never  Vaping Use   Vaping status: Never Used  Substance Use Topics   Alcohol use: No   Drug use: No     Allergies   Rosuvastatin , Bee venom, and Lisinopril    Review of Systems Review of Systems   Physical Exam Triage Vital Signs ED Triage Vitals  Encounter Vitals Group     BP 08/30/24 1759 (!) 151/82     Girls Systolic BP Percentile --      Girls Diastolic BP Percentile --      Boys Systolic BP Percentile --      Boys Diastolic BP Percentile --      Pulse Rate 08/30/24 1759 72     Resp 08/30/24 1759 18     Temp 08/30/24 1759 98.1 F (36.7 C)     Temp Source 08/30/24 1759 Oral     SpO2 08/30/24 1759 97 %     Weight --      Height --      Head Circumference --      Peak Flow --      Pain Score 08/30/24 1757 0     Pain Loc --      Pain Education --      Exclude from Growth Chart --    No data found.  Updated Vital Signs BP (!) 151/82 (BP Location: Right Arm)   Pulse 72   Temp 98.1 F (36.7  C) (Oral)   Resp 18   SpO2 97%   Visual Acuity Right Eye Distance:   Left Eye Distance:   Bilateral Distance:  Right Eye Near:   Left Eye Near:    Bilateral Near:     Physical Exam Constitutional:      Appearance: Normal appearance.  Eyes:     Extraocular Movements: Extraocular movements intact.  Pulmonary:     Effort: Pulmonary effort is normal.  Skin:    Comments: Erythematous blistering rash generalized to the body  Neurological:     Mental Status: She is alert and oriented to person, place, and time. Mental status is at baseline.      UC Treatments / Results  Labs (all labs ordered are listed, but only abnormal results are displayed) Labs Reviewed - No data to display  EKG   Radiology No results found.  Procedures Procedures (including critical care time)  Medications Ordered in UC Medications  dexamethasone  (DECADRON ) injection 10 mg (10 mg Intramuscular Given 08/30/24 1820)    Initial Impression / Assessment and Plan / UC Course  I have reviewed the triage vital signs and the nursing notes.  Pertinent labs & imaging results that were available during my care of the patient were reviewed by me and considered in my medical decision making (see chart for details).  Poison ivy dermatitis  Presentation and symptomology consistent with above diagnosis, known exposure, Decadron  IM given and prescribed oral prednisone  for home use and discussed administration, recommended over-the-counter medications and nonpharmacological measures for management of itching, may follow-up with urgent care as needed Final Clinical Impressions(s) / UC Diagnoses   Final diagnoses:  Poison ivy dermatitis     Discharge Instructions      Today you are being treated for the poison ivy/oak rash  You have been given an injection of steroids today in the office today to help reduce the inflammatory process that occurs with this rash which will help minimize your itching as  well as begin to clear  Starting tomorrow take prednisone  every morning with food as directed, to continue the above process  You may continue use of topical calamine or Benadryl cream to help manage itching, you may also continue oral Benadryl  Please avoid long exposures to heat such as a hot steamy shower or being outside as this may cause further irritation to your rash  You may follow-up with his urgent care as needed if symptoms persist or worsen    ED Prescriptions     Medication Sig Dispense Auth. Provider   predniSONE  (STERAPRED UNI-PAK 21 TAB) 10 MG (21) TBPK tablet Take by mouth daily. Take 6 tabs by mouth daily  for 1 days, then 5 tabs for 1 days, then 4 tabs for 1 days, then 3 tabs for 1 days, 2 tabs for 1 days, then 1 tab by mouth daily for 1 days 21 tablet Laiana Fratus, Shelba SAUNDERS, NP      PDMP not reviewed this encounter.   Teresa Shelba SAUNDERS, [place] 08/31/24 [phone]

## 2024-09-06 ENCOUNTER — Encounter: Payer: Self-pay | Admitting: Student in an Organized Health Care Education/Training Program

## 2024-09-06 ENCOUNTER — Ambulatory Visit
Attending: Student in an Organized Health Care Education/Training Program | Admitting: Student in an Organized Health Care Education/Training Program

## 2024-09-06 VITALS — BP 146/79 | HR 62 | Temp 97.0°F | Resp 16 | Ht 61.0 in | Wt 151.0 lb

## 2024-09-06 DIAGNOSIS — G894 Chronic pain syndrome: Secondary | ICD-10-CM | POA: Diagnosis not present

## 2024-09-06 DIAGNOSIS — G5701 Lesion of sciatic nerve, right lower limb: Secondary | ICD-10-CM | POA: Diagnosis not present

## 2024-09-06 DIAGNOSIS — M461 Sacroiliitis, not elsewhere classified: Secondary | ICD-10-CM | POA: Insufficient documentation

## 2024-09-06 NOTE — Patient Instructions (Signed)

## 2024-09-06 NOTE — Progress Notes (Signed)
 PROVIDER NOTE: Interpretation of information contained herein should be left to medically-trained personnel. Specific patient instructions are provided elsewhere under Patient Instructions section of medical record. This document was created in part using AI and STT-dictation technology, any transcriptional errors that may result from this process are unintentional.  Patient: Sara Mills  Service: E/M Encounter  Provider: Wallie Sherry, MD  DOB: 1951/07/27  Delivery: Face-to-face  Specialty: Interventional Pain Management  MRN: [id number]  Setting: Ambulatory outpatient facility  Specialty designation: 09  Type: New Patient  Location: Outpatient office facility  PCP: [name], MD  DOS: 09/06/2024    Referring Prov.: Clois Fret, MD   Primary Reason(s) for Visit: Encounter for initial evaluation of one or more chronic problems (new to examiner) potentially causing chronic pain, and posing a threat to normal musculoskeletal function. (Level of risk: High) CC: Back Pain (Lower, right)  HPI  Sara Mills is a 73 y.o. year old, female patient, who comes for the first time to our practice referred by Clois Fret, MD for our initial evaluation of her chronic pain. She has COLONIC POLYPS; THYROID  NODULE; Hyperlipidemia due to type 2 diabetes mellitus (HCC); GERD; Premature menopause on hormone replacement therapy; Essential hypertension; Overweight (BMI 25.0-29.9); Encounter for preventive health examination; Encounter for long-term (current) use of other high-risk medications; Long-term use of high-risk medication; Low back pain due to displacement of intervertebral disc; Cervical spondylosis with radiculopathy; Angiotensin converting enzyme inhibitor-aggravated angioedema; Hepatic steatosis; Herpes simplex; History of pneumonia; Breast calcifications on mammogram; Tinnitus aurium, left; Stage 3a chronic kidney disease (HCC); Postprandial diarrhea; Myalgia due to statin; SI joint arthritis  (right); Piriformis syndrome of right side; and Chronic pain syndrome on their problem list. Today she comes in for evaluation of her Back Pain (Lower, right)  Pain Assessment: Location: Right, Lower Back Radiating: right hip, right leg the foot Onset: More than a month ago Duration: Chronic pain Quality: Nagging, Shooting Severity: 8 /10 (subjective, self-reported pain score)  Effect on ADL: difficulty performing daily activities Timing: Constant Modifying factors: Gabapentin , Tylenol , Tramadol , ice, heat BP: (!) 146/79  HR: 62  Onset and Duration: Gradual and Present longer than 3 months Cause of pain: Unknown Severity: Getting worse, NAS-11 at its worse: 10/10, and NAS-11 at its best: 3/10 Timing: Not influenced by the time of the day, During activity or exercise, After activity or exercise, and After a period of immobility Aggravating Factors: Bending, Climbing, Kneeling, Lifiting, Motion, Prolonged sitting, Prolonged standing, Squatting, Stooping , Twisting, Walking, and Working Alleviating Factors: Medications and Sleeping Associated Problems: Fatigue, Inability to concentrate, Numbness, Sadness, Temperature changes, Weakness, Pain that wakes patient up, and Pain that does not allow patient to sleep Quality of Pain: Aching, Agonizing, Annoying, Constant, Deep, Disabling, Dull, Exhausting, Feeling of weight, Getting longer, Heavy, Nagging, Pressure-like, Stabbing, Tender, Throbbing, Tiring, and Uncomfortable Previous Examinations or Tests: MRI scan, X-rays, Neurosurgical evaluation, and Orthopedic evaluation Previous Treatments: Epidural steroid injections, Physical Therapy, Pool exercises, Stretching exercises, and Trigger point injections  [name] is being evaluated for possible interventional pain management therapies for the treatment of her chronic pain.   Sara Mills is a pleasant 73 year old female who is accompanied today by her husband with a history of right lower back pain,  right buttock pain overlying her SI joint and piriformis that radiates down her right posterior lateral leg and also anteriorly into her groin.  This has been going on for many years.  She states that she is not having any pain in her  SI joint today because she acquired poison ivy a couple of weeks ago and was treated with a steroid taper which also helped out with her right buttock pain.  She states that prior to the steroid taper the pain was severe and debilitating.  She has had multiple epidural steroid injections in her lumbar spine which were not helpful.  She is currently on gabapentin  as below.  She is also a type II diabetic.  She is also complaining of shoulder pain today.  She has been referred here by neurosurgery, Dr. Katrina to consider a right SI joint injection.  Meds   Current Outpatient Medications:    ACCU-CHEK GUIDE TEST test strip, USE TO CHECK BLOOD SUGARS ONCE DAILY, Disp: 100 strip, Rfl: 12   ezetimibe  (ZETIA ) 10 MG tablet, TAKE 1 TABLET BY MOUTH EVERY DAY, Disp: 90 tablet, Rfl: 1   gabapentin  (NEURONTIN ) 600 MG tablet, Take 1 tablet (600 mg total) by mouth 2 (two) times daily., Disp: 60 tablet, Rfl: 0   glipiZIDE  (GLUCOTROL ) 5 MG tablet, Take 0.5 tablets (2.5 mg total) by mouth 2 (two) times daily before a meal., Disp: 90 tablet, Rfl: 1   glucose blood (ACCU-CHEK GUIDE) test strip, USE 1 STRIP TO CHECK BLOOD GLUCOSE ONCE A DAY Dx code: E11.69, Disp: 50 strip, Rfl: 1   losartan  (COZAAR ) 100 MG tablet, Take 1 tablet (100 mg total) by mouth daily., Disp: 90 tablet, Rfl: 1   traMADol  (ULTRAM ) 50 MG tablet, Take 1 tablet (50 mg total) by mouth every 8 (eight) hours as needed., Disp: 90 tablet, Rfl: 1   valACYclovir  (VALTREX ) 500 MG tablet, TAKE 1 TABLET BY MOUTH THREE TIMES A DAY (Patient taking differently: Take 500 mg by mouth 3 (three) times daily as needed.), Disp: 21 tablet, Rfl: 2   Vitamin D , Ergocalciferol , (DRISDOL) 1.25 MG (50000 UNIT) CAPS capsule, Take 50,000 Units by  mouth every 7 (seven) days., Disp: , Rfl:    zolpidem  (AMBIEN ) 10 MG tablet, TAKE 1 TABLET (10 MG TOTAL) BY MOUTH AT BEDTIME AS NEEDED FOR SLEEP. FOR SLEEP, Disp: 30 tablet, Rfl: 2   predniSONE  (STERAPRED UNI-PAK 21 TAB) 10 MG (21) TBPK tablet, Take by mouth daily. Take 6 tabs by mouth daily  for 1 days, then 5 tabs for 1 days, then 4 tabs for 1 days, then 3 tabs for 1 days, 2 tabs for 1 days, then 1 tab by mouth daily for 1 days (Patient not taking: Reported on 09/06/2024), Disp: 21 tablet, Rfl: 0   tiZANidine  (ZANAFLEX ) 4 MG tablet, Take 1 tablet (4 mg total) by mouth every 6 (six) hours as needed for muscle spasms. (Patient not taking: Reported on 09/06/2024), Disp: 90 tablet, Rfl: 1  Imaging Review   DG Cervical Spine Complete  Narrative CLINICAL DATA:  73 year old female with neck pain extending to right shoulder for several months. No injury or prior surgery. Initial encounter.  EXAM: CERVICAL SPINE - COMPLETE 4+ VIEW  COMPARISON:  None.  FINDINGS: Minimal anterior slip C3 and mild anterior slip C4 secondary to facet degenerative changes. Mild straightening of the cervical spine below this level.  Moderate C5-6 and C6-7 disc space narrowing with surrounding osteophyte.  Not able to adequately assess neural foramen secondary to slight rotation on oblique views.  No fracture identified.  No worrisome lung apical lesion noted.  IMPRESSION: 1. Moderate C5-6 and C6-7 disc space narrowing with surrounding osteophyte. 2. Minimal anterior slip C3 and mild anterior slip C4 secondary to facet degenerative changes.  Electronically Signed By: Elspeth Slain M.D. On: 11/17/2018 20:31   Complexity Note: Imaging results reviewed.                         ROS  Cardiovascular: High blood pressure Pulmonary or Respiratory: No reported pulmonary signs or symptoms such as wheezing and difficulty taking a deep full breath (Asthma), difficulty blowing air out (Emphysema), coughing up  mucus (Bronchitis), persistent dry cough, or temporary stoppage of breathing during sleep Neurological: Curved spine Psychological-Psychiatric: Difficulty sleeping and or falling asleep Gastrointestinal: No reported gastrointestinal signs or symptoms such as vomiting or evacuating blood, reflux, heartburn, alternating episodes of diarrhea and constipation, inflamed or scarred liver, or pancreas or irrregular and/or infrequent bowel movements Genitourinary: No reported renal or genitourinary signs or symptoms such as difficulty voiding or producing urine, peeing blood, non-functioning kidney, kidney stones, difficulty emptying the bladder, difficulty controlling the flow of urine, or chronic kidney disease Hematological: No reported hematological signs or symptoms such as prolonged bleeding, low or poor functioning platelets, bruising or bleeding easily, hereditary bleeding problems, low energy levels due to low hemoglobin or being anemic Endocrine: High blood sugar controlled without the use of insulin (NIDDM) Rheumatologic: Joint aches and or swelling due to excess weight (Osteoarthritis) Musculoskeletal: Negative for myasthenia gravis, muscular dystrophy, multiple sclerosis or malignant hyperthermia Work History: Retired  Allergies  Ms. Cratty is allergic to rosuvastatin , bee venom, and lisinopril .  Laboratory Chemistry Profile   Renal Lab Results  Component Value Date   BUN 13 04/20/2024   CREATININE 0.99 04/20/2024   GFR 57.68 (L) 04/20/2024   GFRAA >60 05/23/2020   GFRNONAA 45 (L) 06/01/2022   SPECGRAV 1.025 12/03/2022   PHUR 6.0 12/03/2022   PROTEINUR Negative 12/03/2022     Electrolytes Lab Results  Component Value Date   NA 137 04/20/2024   K 4.3 04/20/2024   CL 102 04/20/2024   CALCIUM  9.5 04/20/2024   PHOS 4.9 (H) 10/04/2022     Hepatic Lab Results  Component Value Date   AST 22 04/20/2024   ALT 27 04/20/2024   ALBUMIN 4.4 04/20/2024   ALKPHOS 75 04/20/2024      ID Lab Results  Component Value Date   LYMEIGGIGMAB <0.91 06/07/2019   HIV NON-REACTIVE 12/17/2017   RMSFIGG Negative 06/07/2019     Bone Lab Results  Component Value Date   VD25OH 31.12 04/04/2021     Endocrine Lab Results  Component Value Date   GLUCOSE 121 (H) 04/20/2024   GLUCOSEU NEGATIVE 09/08/2023   HGBA1C 6.9 (H) 04/20/2024   TSH 1.00 09/08/2023     Neuropathy Lab Results  Component Value Date   VITAMINB12 441 12/17/2017   HGBA1C 6.9 (H) 04/20/2024   HIV NON-REACTIVE 12/17/2017     CNS No results found for: COLORCSF, APPEARCSF, RBCCOUNTCSF, WBCCSF, POLYSCSF, LYMPHSCSF, EOSCSF, PROTEINCSF, GLUCCSF, JCVIRUS, CSFOLI, IGGCSF, LABACHR, ACETBL   Inflammation (CRP: Acute  ESR: Chronic) Lab Results  Component Value Date   ESRSEDRATE 14 04/04/2021   LATICACIDVEN 1.3 06/02/2022     Rheumatology Lab Results  Component Value Date   RF <14 04/04/2021   ANA NEGATIVE 01/27/2019   LYMEIGGIGMAB <0.91 06/07/2019   LYMEABIGMQN <0.80 06/07/2019     Coagulation Lab Results  Component Value Date   INR 1.1 06/02/2022   LABPROT 14.5 06/02/2022   APTT 37 (H) 06/02/2022   PLT 151.0 09/08/2023     Cardiovascular Lab Results  Component Value Date   HGB 13.1  09/08/2023   HCT 40.3 09/08/2023     Screening Lab Results  Component Value Date   HIV NON-REACTIVE 12/17/2017     Cancer No results found for: CEA, CA125, LABCA2   Allergens No results found for: ALMOND, APPLE, ASPARAGUS, AVOCADO, BANANA, BARLEY, BASIL, BAYLEAF, GREENBEAN, LIMABEAN, WHITEBEAN, BEEFIGE, REDBEET, BLUEBERRY, BROCCOLI, CABBAGE, MELON, CARROT, CASEIN, CASHEWNUT, CAULIFLOWER, CELERY     Note: Lab results reviewed.  PFSH  Drug: Ms. Ringel  reports no history of drug use. Alcohol:  reports no history of alcohol use. Tobacco:  reports that she has never smoked. She has never used smokeless tobacco. Medical:  has a past  medical history of Angioedema (06/08/2017), Arthritis, Diabetes mellitus, GERD (gastroesophageal reflux disease), Hyperlipidemia, and Hypertension. Family: family history includes Arthritis in her mother and paternal grandmother; Asthma in her brother; Cancer (age of onset: 30) in her father; Diabetes in her maternal grandfather; Heart disease in her mother; Heart murmur in her mother; Miscarriages / Stillbirths in her mother; Mitral valve prolapse in her mother; Stomach cancer in her father; Stroke in her mother; Varicose Veins in her mother.  Past Surgical History:  Procedure Laterality Date   ABDOMINAL HYSTERECTOMY  1995   and BSO   APPENDECTOMY     BILATERAL OOPHORECTOMY     BREAST BIOPSY Right 02/15/2021   right breast calcs, x marker, BENIGN BREAST TISSUE WITH FIBROADENOMATOID CHANGES ANDASSOCIATED CALCIFICATIONS. - NEGATIVE FOR ATYPIA AND MALIGNANCY   BUNIONECTOMY     CARPAL TUNNEL RELEASE Right    CESAREAN SECTION  1977 and 1978   Active Ambulatory Problems    Diagnosis Date Noted   COLONIC POLYPS 07/27/2007   THYROID  NODULE 08/06/2007   Hyperlipidemia due to type 2 diabetes mellitus (HCC) 07/27/2007   GERD 12/09/2004   Premature menopause on hormone replacement therapy 03/20/2012   Essential hypertension    Overweight (BMI 25.0-29.9) 03/22/2012   Encounter for preventive health examination 01/12/2014   Encounter for long-term (current) use of other high-risk medications 09/15/2015   Long-term use of high-risk medication 12/20/2017   Low back pain due to displacement of intervertebral disc 09/29/2018   Cervical spondylosis with radiculopathy 11/19/2018   Angiotensin converting enzyme inhibitor-aggravated angioedema 02/20/2019   Hepatic steatosis 02/20/2019   Herpes simplex 05/20/2019   History of pneumonia 10/05/2020   Breast calcifications on mammogram 02/03/2021   Tinnitus aurium, left 04/04/2022   Stage 3a chronic kidney disease (HCC) 05/20/2022   Postprandial diarrhea  09/08/2023   Myalgia due to statin 09/22/2023   SI joint arthritis (right) 09/06/2024   Piriformis syndrome of right side 09/06/2024   Chronic pain syndrome 09/06/2024   Resolved Ambulatory Problems    Diagnosis Date Noted   Diabetes mellitus with end stage renal disease (HCC) 05/09/2005   Anemia 07/27/2007   FATIGUE 08/06/2007   GERD (gastroesophageal reflux disease)    Diabetes mellitus    Hyperlipidemia    Chronic cough 04/21/2012   Hyperlipidemia associated with type 2 diabetes mellitus (HCC) 08/26/2012   Sensation of fullness in left ear 08/27/2013   Exertional dyspnea 01/12/2014   Angioedema 06/08/2017   Sacro-iliac pain 06/08/2017   Elevated liver enzymes 01/05/2019   Tick bite of abdomen 05/20/2019   Tick bite of abdominal wall 05/22/2019   Cellulitis of second toe of left foot 09/05/2019   Acute non-recurrent sinusitis 05/19/2020   Viral URI 05/19/2020   Cough with sputum 05/19/2020   Renal insufficiency, mild 05/11/2022   Dysuria 09/08/2023   Past Medical History:  Diagnosis  Date   Arthritis    Hypertension    Constitutional Exam  General appearance: Well nourished, well developed, and well hydrated. In no apparent acute distress Vitals:   09/06/24 1359  BP: (!) 146/79  Pulse: 62  Resp: 16  Temp: (!) 97 F (36.1 C)  TempSrc: Temporal  SpO2: 97%  Weight: 151 lb (68.5 kg)  Height: 5' 1 (1.549 m)   BMI Assessment: Estimated body mass index is 28.53 kg/m as calculated from the following:   Height as of this encounter: 5' 1 (1.549 m).   Weight as of this encounter: 151 lb (68.5 kg).  BMI interpretation table: BMI level Category Range association with higher incidence of chronic pain  <18 kg/m2 Underweight   18.5-24.9 kg/m2 Ideal body weight   25-29.9 kg/m2 Overweight Increased incidence by 20%  30-34.9 kg/m2 Obese (Class I) Increased incidence by 68%  35-39.9 kg/m2 Severe obesity (Class II) Increased incidence by 136%  >40 kg/m2 Extreme obesity  (Class III) Increased incidence by 254%   Patient's current BMI Ideal Body weight  Body mass index is 28.53 kg/m. Ideal body weight: 47.8 kg (105 lb 6.1 oz) Adjusted ideal body weight: 56.1 kg (123 lb 10 oz)   BMI Readings from Last 4 Encounters:  09/06/24 28.53 kg/m  08/03/24 28.34 kg/m  06/07/24 28.46 kg/m  12/08/23 28.34 kg/m   Wt Readings from Last 4 Encounters:  09/06/24 151 lb (68.5 kg)  08/03/24 150 lb (68 kg)  06/07/24 150 lb 9.6 oz (68.3 kg)  12/08/23 150 lb (68 kg)    Psych/Mental status: Alert, oriented x 3 (person, place, & time)       Eyes: PERLA Respiratory: No evidence of acute respiratory distress  Lumbar Spine Area Exam  Skin & Axial Inspection: No masses, redness, or swelling Alignment: Symmetrical Functional ROM: Pain restricted ROM       Stability: No instability detected Muscle Tone/Strength: Functionally intact. No obvious neuro-muscular anomalies detected. Sensory (Neurological): Referred pain pattern Palpation: No palpable anomalies       Provocative Tests: Hyperextension/rotation test: deferred today       Lumbar quadrant test (Kemp's test): deferred today       Lateral bending test: deferred today       Patrick's Maneuver: (+) for right-sided S-I arthralgia             FABER* test: (+) for right-sided S-I arthralgia             S-I anterior distraction/compression test: (+) for right-sided S-I arthralgia             S-I lateral compression test: (+) for right-sided S-I arthralgia             S-I Thigh-thrust test: (+) for right-sided S-I arthralgia             S-I Gaenslen's test: (+) for right-sided S-I arthralgia             *(Flexion, ABduction and External Rotation)  Assessment  Primary Diagnosis & Pertinent Problem List: The primary encounter diagnosis was SI joint arthritis (right). Diagnoses of Piriformis syndrome of right side and Chronic pain syndrome were also pertinent to this visit.  Visit Diagnosis (New problems to  examiner): 1. SI joint arthritis (right)   2. Piriformis syndrome of right side   3. Chronic pain syndrome    Plan of Care (Initial workup plan)  The patient presents with right buttock pain localized over the SI joint region and piriformis region, with  exam findings consistent with sacroiliac (SI) joint dysfunction and underlying arthritis. Currently, she does not report SI joint pain, which is likely attenuated by the recent course of oral steroids prescribed for poison ivy. Given the clinical picture, we discussed the option of a diagnostic right SI joint injection to be pursued in the event of a flare-up of SI joint pain in the future (PRN RIGHT SIJ injection). This could be performed with oral diazepam (Valium) for anxiolysis as needed. In the meantime, we will continue to monitor her symptoms, encourage activity modification and supportive measures, and reassess intervention if her pain recurs or escalates. Provider-requested follow-up: Return for PRN- for right SIJ and right piriformis , in clinic (PO Valium 5mg ).  Future Appointments  Date Time Provider Department Center  09/07/2024  1:30 PM Clois Fret, MD [facility] None  10/12/2024  7:45 AM [facility] [address]  10/14/2024 10:00 AM Marylynn Verneita CROME, MD [facility] [address]  12/13/2024 10:10 AM [facility] ANNUAL WELLNESS VISIT [facility] [address]   I discussed the assessment and treatment plan with the patient. The patient was provided an opportunity to ask questions and all were answered. The patient agreed with the plan and demonstrated an understanding of the instructions.  Patient advised to call back or seek an in-person evaluation if the symptoms or condition worsens.  Duration of encounter: .  Total time on encounter, as per AMA guidelines included both the face-to-face and non-face-to-face time personally spent by the physician and/or other qualified health care professional(s) on the day of the  encounter (includes time in activities that require the physician or other qualified health care professional and does not include time in activities normally performed by clinical staff). Physician's time may include the following activities when performed: Preparing to see the patient (e.g., pre-charting review of records, searching for previously ordered imaging, lab work, and nerve conduction tests) Review of prior analgesic pharmacotherapies. Obtaining and/or reviewing separately obtained history Performing a medically appropriate examination and/or evaluation Ordering medications, tests, or procedures Referring and communicating with other health care professionals (when not separately reported) Documenting clinical information in the electronic or other health record   Note by: Wallie Sherry, MD (TTS and AI technology used. I apologize for any typographical errors that were not detected and corrected.) Date: 09/06/2024; Time: 3:02 PM

## 2024-09-07 ENCOUNTER — Encounter: Payer: Self-pay | Admitting: Neurosurgery

## 2024-09-07 ENCOUNTER — Ambulatory Visit (INDEPENDENT_AMBULATORY_CARE_PROVIDER_SITE_OTHER): Admitting: Neurosurgery

## 2024-09-07 VITALS — BP 144/82 | Ht 61.0 in | Wt 151.0 lb

## 2024-09-07 DIAGNOSIS — M5412 Radiculopathy, cervical region: Secondary | ICD-10-CM

## 2024-09-07 DIAGNOSIS — G8929 Other chronic pain: Secondary | ICD-10-CM

## 2024-09-07 DIAGNOSIS — M5441 Lumbago with sciatica, right side: Secondary | ICD-10-CM

## 2024-09-07 DIAGNOSIS — M4802 Spinal stenosis, cervical region: Secondary | ICD-10-CM

## 2024-09-07 DIAGNOSIS — G959 Disease of spinal cord, unspecified: Secondary | ICD-10-CM

## 2024-09-07 NOTE — Progress Notes (Signed)
 Referring Physician:  Marylynn Verneita CROME, MD [address] [place],  [place] [address]  Primary Physician:  Marylynn Verneita CROME, MD  History of Present Illness: 09/07/2024 Sara Mills is feeling much better. She is on steroids for a flair currently.  08/03/2024 Sara Mills is here today with a chief complaint of low back pain radiating to the right hip.  This has been ongoing for some time.  She has tried multiple different treatments over the time.  She has been seen by an outside physician who recommended considering surgical intervention.  She is here today for reevaluation.  She has also had pain in her neck, shoulder blades, and arms ongoing for more than a year.  She has suffered from problems with her balance.  She has not had any falls, but has continued issues with numbness down her right arm as well as imbalance.   Bowel/Bladder Dysfunction: none  Conservative measures:  Physical therapy:  has participated in PT at [facility] including regular antiinflammatories: Gabapentin , Diclofenac , Hydrocodone, Tylenol  Injections: 08/27/2022 L2-3 ESI 08/06/2022 L5-S1 ESI 06/28/2022 C7-T1 ESI 04/10/2021 epidural steroid injections  Past Surgery: none  Sara Mills has some symptoms of cervical myelopathy.  The symptoms are causing a significant impact on the patient's life.   I have utilized the care everywhere function in epic to review the outside records available from external health systems.   Review of Systems:  A 10 point review of systems is negative, except for the pertinent positives and negatives detailed in the HPI.  Past Medical History: Past Medical History:  Diagnosis Date   Angioedema 06/08/2017   Arthritis    Diabetes mellitus    GERD (gastroesophageal reflux disease)    Hyperlipidemia    Hypertension     Past Surgical History: Past Surgical History:  Procedure Laterality Date   ABDOMINAL HYSTERECTOMY  1995   and  BSO   APPENDECTOMY     BILATERAL OOPHORECTOMY     BREAST BIOPSY Right 02/15/2021   right breast calcs, x marker, BENIGN BREAST TISSUE WITH FIBROADENOMATOID CHANGES ANDASSOCIATED CALCIFICATIONS. - NEGATIVE FOR ATYPIA AND MALIGNANCY   BUNIONECTOMY     CARPAL TUNNEL RELEASE Right    CESAREAN SECTION  1977 and 1978    Allergies: Allergies as of 09/07/2024 - Review Complete 09/07/2024  Allergen Reaction Noted   Rosuvastatin   09/22/2023   Bee venom  05/20/2022   Lisinopril   06/08/2017    Medications:  Current Outpatient Medications:    ezetimibe  (ZETIA ) 10 MG tablet, TAKE 1 TABLET BY MOUTH EVERY DAY, Disp: 90 tablet, Rfl: 1   gabapentin  (NEURONTIN ) 600 MG tablet, Take 1 tablet (600 mg total) by mouth 2 (two) times daily., Disp: 60 tablet, Rfl: 0   glipiZIDE  (GLUCOTROL ) 5 MG tablet, Take 0.5 tablets (2.5 mg total) by mouth 2 (two) times daily before a meal., Disp: 90 tablet, Rfl: 1   glucose blood (ACCU-CHEK GUIDE) test strip, USE 1 STRIP TO CHECK BLOOD GLUCOSE ONCE A DAY Dx code: E11.69, Disp: 50 strip, Rfl: 1   losartan  (COZAAR ) 100 MG tablet, Take 1 tablet (100 mg total) by mouth daily., Disp: 90 tablet, Rfl: 1   predniSONE  (STERAPRED UNI-PAK 21 TAB) 10 MG (21) TBPK tablet, Take by mouth daily. Take 6 tabs by mouth daily  for 1 days, then 5 tabs for 1 days, then 4 tabs for 1 days, then 3 tabs for 1 days, 2 tabs for 1 days, then 1 tab by  mouth daily for 1 days, Disp: 21 tablet, Rfl: 0   traMADol  (ULTRAM ) 50 MG tablet, Take 1 tablet (50 mg total) by mouth every 8 (eight) hours as needed., Disp: 90 tablet, Rfl: 1   valACYclovir  (VALTREX ) 500 MG tablet, TAKE 1 TABLET BY MOUTH THREE TIMES A DAY (Patient taking differently: Take 500 mg by mouth 3 (three) times daily as needed.), Disp: 21 tablet, Rfl: 2   Vitamin D , Ergocalciferol , (DRISDOL) 1.25 MG (50000 UNIT) CAPS capsule, Take 50,000 Units by mouth every 7 (seven) days., Disp: , Rfl:    zolpidem  (AMBIEN ) 10 MG tablet, TAKE 1 TABLET (10 MG  TOTAL) BY MOUTH AT BEDTIME AS NEEDED FOR SLEEP. FOR SLEEP, Disp: 30 tablet, Rfl: 2  Social History: Social History   Tobacco Use   Smoking status: Never   Smokeless tobacco: Never  Vaping Use   Vaping status: Never Used  Substance Use Topics   Alcohol use: No   Drug use: No    Family Medical History: Family History  Problem Relation Age of Onset   Stroke Mother    Heart murmur Mother    Mitral valve prolapse Mother    Arthritis Mother    Heart disease Mother    Miscarriages / Stillbirths Mother    Varicose Veins Mother    Cancer Father 35       carcinoid syndrome   Stomach cancer Father    Diabetes Maternal Grandfather    Arthritis Paternal Grandmother    Asthma Brother    Breast cancer Neg Hx     Physical Examination: Vitals:   09/07/24 1322  BP: (!) 144/82    General: Patient is in no apparent distress. Attention to examination is appropriate.  Neck:   Supple.  Full range of motion.  Respiratory: Patient is breathing without any difficulty.   NEUROLOGICAL:     Awake, alert, oriented to person, place, and time.  Speech is clear and fluent.   Cranial Nerves: Pupils equal round and reactive to light.  Facial tone is symmetric.  Facial sensation is symmetric. Shoulder shrug is symmetric. Tongue protrusion is midline.  There is no pronator drift.  Strength: Side Biceps Triceps Deltoid Interossei Grip Wrist Ext. Wrist Flex.  R 5 5 5 5 5 5 5   L 5 5 5 5 5 5 5    Side Iliopsoas Quads Hamstring PF DF EHL  R 5 5 5 5 5 5   L 5 5 5 5 5 5    Reflexes are 1+ and symmetric at the biceps, triceps, brachioradialis, patella and achilles.   Hoffman's is present.   Bilateral upper and lower extremity sensation is intact to light touch with exception of R arm with R C6 and C7 diminished sensation.    No evidence of dysmetria noted.    Medical Decision Making  Imaging: MRI cervical spine on December 08, 2023 shows large disc herniation at C5-6 with severe bilateral  foraminal stenosis and moderate central stenosis.  At C6-7, she has moderate to severe bilateral uncovertebral hypertrophy with mild to moderate central stenosis and moderate right and mild left foraminal stenosis.  MRI lumbar spine on January 08, 2024 shows prominent progression of T12-L1 to space narrowing with Modic 1 prominent endplate marrow edema without changes of discitis osteomyelitis.  At L2-3 she has moderate right and mild left subarticular narrowing.  At L4-5, she has moderate left subarticular narrowing.  I have personally reviewed the images and agree with the above interpretation.  Assessment and Plan: Ms.  Mills is a pleasant 73 y.o. female with evidence of sacroiliac pain on the right side as well as low back pain with right-sided sciatica.  She additionally cervical radiculopathy.  For her neck, she has had symptoms for quite some time.  She has some grip weakness and some overt signs and symptoms of cervical radiculopathy as well as imbalance suggesting myelopathy.  Currently she is feeling better since starting steroids, but I suspect that will be short-lived. She has severe foraminal stenosis C5-6 and C6-7.  I have recommended anterior cervical discectomy and fusion at C5-7.     She will let me know if her symptoms worsen.  She has some scoliosis and upper lumbar kyphosis/stenosis that could be causing some of her R leg pain.  We will see if her pain returns after that steroid taper.  I spent a total of 10 minutes in this patient's care today. This time was spent reviewing pertinent records including imaging studies, obtaining and confirming history, performing a directed evaluation, formulating and discussing my recommendations, and documenting the visit within the medical record.   Thank you for involving me in the care of this patient.      Labrian Torregrossa K. Clois MD, [facility] Neurosurgery

## 2024-09-12 ENCOUNTER — Other Ambulatory Visit: Payer: Self-pay | Admitting: Nurse Practitioner

## 2024-09-12 DIAGNOSIS — M5126 Other intervertebral disc displacement, lumbar region: Secondary | ICD-10-CM

## 2024-09-28 DIAGNOSIS — M4802 Spinal stenosis, cervical region: Secondary | ICD-10-CM | POA: Diagnosis not present

## 2024-09-28 DIAGNOSIS — M5412 Radiculopathy, cervical region: Secondary | ICD-10-CM | POA: Diagnosis not present

## 2024-09-28 DIAGNOSIS — G959 Disease of spinal cord, unspecified: Secondary | ICD-10-CM | POA: Diagnosis not present

## 2024-09-28 DIAGNOSIS — M542 Cervicalgia: Secondary | ICD-10-CM | POA: Diagnosis not present

## 2024-09-30 DIAGNOSIS — M5412 Radiculopathy, cervical region: Secondary | ICD-10-CM | POA: Diagnosis not present

## 2024-10-04 ENCOUNTER — Telehealth: Payer: Self-pay

## 2024-10-04 DIAGNOSIS — N1831 Chronic kidney disease, stage 3a: Secondary | ICD-10-CM

## 2024-10-04 DIAGNOSIS — M461 Sacroiliitis, not elsewhere classified: Secondary | ICD-10-CM

## 2024-10-04 NOTE — Telephone Encounter (Signed)
 Copied from CRM [id number]. Topic: General - Other >> Oct 04, 2024 10:15 AM Burnard DEL wrote: Reason for CRM: Patient is scheduled for labs next week in our office,then she is scheduled to see her kidney doctor the following week.She would like to know if she could have the labs that her kidney doctor is going to draw,drawn at our office next week so that she doesn't ave to get her blood drawn two times?

## 2024-10-06 DIAGNOSIS — G959 Disease of spinal cord, unspecified: Secondary | ICD-10-CM | POA: Diagnosis not present

## 2024-10-06 DIAGNOSIS — M5412 Radiculopathy, cervical region: Secondary | ICD-10-CM | POA: Diagnosis not present

## 2024-10-07 ENCOUNTER — Ambulatory Visit (HOSPITAL_COMMUNITY): Payer: Self-pay | Admitting: Physician Assistant

## 2024-10-08 NOTE — Telephone Encounter (Signed)
 Pt stated that the doctor told her we would need to draw a PTH and a uric acid.

## 2024-10-11 NOTE — Addendum Note (Signed)
 Addended by: MARYLYNN VERNEITA CROME on: 10/11/2024 08:40 AM   Modules accepted: Orders

## 2024-10-12 ENCOUNTER — Other Ambulatory Visit (INDEPENDENT_AMBULATORY_CARE_PROVIDER_SITE_OTHER)

## 2024-10-12 DIAGNOSIS — E785 Hyperlipidemia, unspecified: Secondary | ICD-10-CM | POA: Diagnosis not present

## 2024-10-12 DIAGNOSIS — E119 Type 2 diabetes mellitus without complications: Secondary | ICD-10-CM

## 2024-10-12 DIAGNOSIS — I10 Essential (primary) hypertension: Secondary | ICD-10-CM | POA: Diagnosis not present

## 2024-10-12 DIAGNOSIS — N1831 Chronic kidney disease, stage 3a: Secondary | ICD-10-CM | POA: Diagnosis not present

## 2024-10-12 DIAGNOSIS — M461 Sacroiliitis, not elsewhere classified: Secondary | ICD-10-CM

## 2024-10-12 DIAGNOSIS — E1169 Type 2 diabetes mellitus with other specified complication: Secondary | ICD-10-CM | POA: Diagnosis not present

## 2024-10-12 DIAGNOSIS — E041 Nontoxic single thyroid nodule: Secondary | ICD-10-CM

## 2024-10-12 DIAGNOSIS — E669 Obesity, unspecified: Secondary | ICD-10-CM | POA: Diagnosis not present

## 2024-10-12 LAB — COMPREHENSIVE METABOLIC PANEL WITH GFR
ALT: 31 U/L (ref 0–35)
AST: 22 U/L (ref 0–37)
Albumin: 4.1 g/dL (ref 3.5–5.2)
Alkaline Phosphatase: 69 U/L (ref 39–117)
BUN: 16 mg/dL (ref 6–23)
CO2: 27 meq/L (ref 19–32)
Calcium: 9.3 mg/dL (ref 8.4–10.5)
Chloride: 102 meq/L (ref 96–112)
Creatinine, Ser: 1.06 mg/dL (ref 0.40–1.20)
GFR: 52.96 mL/min — ABNORMAL LOW (ref >60.00)
Glucose, Bld: 134 mg/dL — ABNORMAL HIGH (ref 70–99)
Potassium: 3.8 meq/L (ref 3.5–5.1)
Sodium: 138 meq/L (ref 135–145)
Total Bilirubin: 0.7 mg/dL (ref 0.2–1.2)
Total Protein: 7.1 g/dL (ref 6.0–8.3)

## 2024-10-12 LAB — HEMOGLOBIN A1C: Hgb A1c MFr Bld: 8.2 % — ABNORMAL HIGH (ref 4.6–6.5)

## 2024-10-12 LAB — LDL CHOLESTEROL, DIRECT: Direct LDL: 124 mg/dL

## 2024-10-12 LAB — LIPID PANEL
Cholesterol: 229 mg/dL — ABNORMAL HIGH (ref 0–200)
HDL: 45 mg/dL (ref >39.00)
LDL Cholesterol: 117 mg/dL — ABNORMAL HIGH (ref 0–99)
NonHDL: 183.75
Total CHOL/HDL Ratio: 5
Triglycerides: 332 mg/dL — ABNORMAL HIGH (ref 0.0–149.0)
VLDL: 66.4 mg/dL — ABNORMAL HIGH (ref 0.0–40.0)

## 2024-10-12 LAB — URIC ACID: Uric Acid, Serum: 5.7 mg/dL (ref 2.4–7.0)

## 2024-10-12 LAB — TSH: TSH: 1.26 u[IU]/mL (ref 0.35–5.50)

## 2024-10-14 ENCOUNTER — Ambulatory Visit: Admitting: Internal Medicine

## 2024-10-14 ENCOUNTER — Encounter: Payer: Self-pay | Admitting: Internal Medicine

## 2024-10-14 VITALS — BP 124/76 | HR 68 | Ht 61.0 in | Wt 151.0 lb

## 2024-10-14 DIAGNOSIS — I440 Atrioventricular block, first degree: Secondary | ICD-10-CM | POA: Diagnosis not present

## 2024-10-14 DIAGNOSIS — K529 Noninfective gastroenteritis and colitis, unspecified: Secondary | ICD-10-CM | POA: Diagnosis not present

## 2024-10-14 DIAGNOSIS — I10 Essential (primary) hypertension: Secondary | ICD-10-CM | POA: Diagnosis not present

## 2024-10-14 DIAGNOSIS — Z23 Encounter for immunization: Secondary | ICD-10-CM

## 2024-10-14 DIAGNOSIS — M4722 Other spondylosis with radiculopathy, cervical region: Secondary | ICD-10-CM | POA: Diagnosis not present

## 2024-10-14 DIAGNOSIS — E1169 Type 2 diabetes mellitus with other specified complication: Secondary | ICD-10-CM | POA: Diagnosis not present

## 2024-10-14 DIAGNOSIS — Z01818 Encounter for other preprocedural examination: Secondary | ICD-10-CM

## 2024-10-14 DIAGNOSIS — E785 Hyperlipidemia, unspecified: Secondary | ICD-10-CM

## 2024-10-14 LAB — PTH, INTACT AND CALCIUM
Calcium: 10.1 mg/dL (ref 8.6–10.4)
PTH: 35 pg/mL (ref 16–77)

## 2024-10-14 MED ORDER — GLIPIZIDE 5 MG PO TABS: 2.5000 mg | ORAL_TABLET | Freq: Two times a day (BID) | ORAL | 1 refills | Status: DC

## 2024-10-14 NOTE — Assessment & Plan Note (Signed)
 I have ordered and reviewed a 12 lead EKG and fcompared it to her 2023 EKG.  that there are no acute changes and patient is in sinus rhythm with a first degree AV block.  This is not a contraindication to surgery Patient  will need to have her diabetes under better control to minimize risk of infection. Medication changes and close follow up will be done over the next 2 weeks  .

## 2024-10-14 NOTE — Progress Notes (Signed)
 Subjective:  Patient ID: Sara Mills, female    DOB: 1951-07-16  Age: 73 y.o. MRN: [id number]  CC: The primary encounter diagnosis was Hyperlipidemia due to type 2 diabetes mellitus (HCC). Diagnoses of Preoperative evaluation of a medical condition to rule out surgical contraindications (TAR required), Preoperative examination, Postprandial diarrhea, Essential hypertension, Cervical spondylosis with radiculopathy, and AV block, 1st degree were also pertinent to this visit.   HPI Sara Mills presents for  Chief Complaint  Patient presents with   Medical Management of Chronic Issues    Follow up on diabetes    1) Uncontrolled type 2 DM:;  patient has been taking 2.5 mg glipizide  twice daily.  Fasting sugars have been 100 to 120.  Post prandials  have been up to 200 frequently .  Has had several weeks of prednisone  therapy for neck and back pain as well as one round  in September for contact dermatitis (received an injection  followed by  a modified taper)  again at the end of October by orthopedist 6 day taper .   2)  Cervical spondylolithesis :  she is requesting medical clearance for an ACDF involving C5-7 to be done by [facility] in early December . She has a history of  diabetes ,  she has  had no recent episodes of chest pain  or dyspnea.    3) Loose stools  have improved from daily to  3 times weekly since  stopping metformin . occurring within 2 hours of eating certain foods (chocolate, fried foods) .  She does not eat salads,  and rarely eats vegetables.  Diet is mostly hamburgers,  pasta,  and junk food.  Denies blood in stools . Last colonoscopy was reviewed and NORMAL in 2023   Outpatient Medications Prior to Visit  Medication Sig Dispense Refill   ezetimibe  (ZETIA ) 10 MG tablet TAKE 1 TABLET BY MOUTH EVERY DAY 90 tablet 1   gabapentin  (NEURONTIN ) 600 MG tablet TAKE 1 TABLET BY MOUTH TWICE A DAY 60 tablet 0   glucose blood (ACCU-CHEK GUIDE) test strip USE 1 STRIP TO  CHECK BLOOD GLUCOSE ONCE A DAY Dx code: E11.69 50 strip 1   losartan  (COZAAR ) 100 MG tablet Take 1 tablet (100 mg total) by mouth daily. 90 tablet 1   traMADol  (ULTRAM ) 50 MG tablet Take 1 tablet (50 mg total) by mouth every 8 (eight) hours as needed. 90 tablet 1   valACYclovir  (VALTREX ) 500 MG tablet TAKE 1 TABLET BY MOUTH THREE TIMES A DAY (Patient taking differently: Take 500 mg by mouth 3 (three) times daily as needed.) 21 tablet 2   Vitamin D , Ergocalciferol , (DRISDOL) 1.25 MG (50000 UNIT) CAPS capsule Take 50,000 Units by mouth every 7 (seven) days.     zolpidem  (AMBIEN ) 10 MG tablet TAKE 1 TABLET (10 MG TOTAL) BY MOUTH AT BEDTIME AS NEEDED FOR SLEEP. FOR SLEEP 30 tablet 2   glipiZIDE  (GLUCOTROL ) 5 MG tablet Take 0.5 tablets (2.5 mg total) by mouth 2 (two) times daily before a meal. 90 tablet 1   predniSONE  (STERAPRED UNI-PAK 21 TAB) 10 MG (21) TBPK tablet Take by mouth daily. Take 6 tabs by mouth daily  for 1 days, then 5 tabs for 1 days, then 4 tabs for 1 days, then 3 tabs for 1 days, 2 tabs for 1 days, then 1 tab by mouth daily for 1 days 21 tablet 0   No facility-administered medications prior to visit.    Review of Systems;  Patient denies headache, fevers, malaise, unintentional weight loss, skin rash, eye pain, sinus congestion and sinus pain, sore throat, dysphagia,  hemoptysis , cough, dyspnea, wheezing, chest pain, palpitations, orthopnea, edema, abdominal pain, nausea, melena,  constipation, flank pain, dysuria, hematuria, urinary  Frequency, nocturia, numbness, tingling, seizures,  Focal weakness, Loss of consciousness,  Tremor, insomnia, depression, anxiety, and suicidal ideation.      Objective:  BP 124/76   Pulse 68   Ht 5' 1 (1.549 m)   Wt 151 lb (68.5 kg)   SpO2 98%   BMI 28.53 kg/m   BP Readings from Last 3 Encounters:  10/14/24 124/76  09/07/24 (!) 144/82  09/06/24 (!) 146/79    Wt Readings from Last 3 Encounters:  10/14/24 151 lb (68.5 kg)  09/07/24 151  lb (68.5 kg)  09/06/24 151 lb (68.5 kg)    Physical Exam  Lab Results  Component Value Date   HGBA1C 8.2 (H) 10/12/2024   HGBA1C 6.9 (H) 04/20/2024   HGBA1C 7.5 (H) 09/08/2023    Lab Results  Component Value Date   CREATININE 1.06 10/12/2024   CREATININE 0.99 04/20/2024   CREATININE 0.91 09/08/2023    Lab Results  Component Value Date   WBC 8.6 09/08/2023   HGB 13.1 09/08/2023   HCT 40.3 09/08/2023   PLT 151.0 09/08/2023   GLUCOSE 134 (H) 10/12/2024   CHOL 229 (H) 10/12/2024   TRIG 332.0 (H) 10/12/2024   HDL 45.00 10/12/2024   LDLDIRECT 124.0 10/12/2024   LDLCALC 117 (H) 10/12/2024   ALT 31 10/12/2024   AST 22 10/12/2024   NA 138 10/12/2024   K 3.8 10/12/2024   CL 102 10/12/2024   CREATININE 1.06 10/12/2024   BUN 16 10/12/2024   CO2 27 10/12/2024   TSH 1.26 10/12/2024   INR 1.1 06/02/2022   HGBA1C 8.2 (H) 10/12/2024   MICROALBUR 1.0 04/20/2024    No results found.  Assessment & Plan:  .Hyperlipidemia due to type 2 diabetes mellitus (HCC) Assessment & Plan: Currently not well-controlled on minimal dose of glipizide  due to recurrent steroid doses over the ast 2 months and non adherence to low GI diet.  . Patient is reminded to schedule an annual eye exam and foot exam is normal today. Patient has no microalbuminuria.  Will increase evening dose of glipizide  to 5 mg   continue 2.5 mg in the am and review blood sugars weekly given tentative surgery date of Dec 7  Orders: -     Lipid panel; Future -     LDL cholesterol, direct; Future -     Comprehensive metabolic panel with GFR; Future -     Hemoglobin A1c; Future  Preoperative evaluation of a medical condition to rule out surgical contraindications (TAR required) -     EKG 12-Lead  Preoperative examination Assessment & Plan: I have ordered and reviewed a 12 lead EKG and fcompared it to her 2023 EKG.  that there are no acute changes and patient is in sinus rhythm with a first degree AV block.  This is not a  contraindication to surgery Patient  will need to have her diabetes under better control to minimize risk of infection. Medication changes and close follow up will be done over the next 2 weeks  .      Postprandial diarrhea Assessment & Plan: normal colonoscopy in 2023 .  5H!AA 24 hour urine was normal. . Symptoms have  imporved with d/c of metformin  and are likely due to IBS  Essential hypertension Assessment & Plan: Home readings have been consistently < 130/80 on current regimen of losartan  100 mg daily.  No changes today    Cervical spondylosis with radiculopathy Assessment & Plan: She has been scheduled for ACDF C5-7 on Dec 7 by [facility] surgeon. Preoperative eval today notes uncontrolled diabetes .     AV block, 1st degree Assessment & Plan: PR interval is 232 msec on today's EKG,  was 212 msec in 2023.  She is asymptomatic    Other orders -     glipiZIDE ; Take 0.5 tablets (2.5 mg total) by mouth 2 (two) times daily before a meal.  Dispense: 90 tablet; Refill: 1 -     Flu vaccine HIGH DOSE PF(Fluzone Trivalent)    In addition to time reading EKG, I  personally spent a total of 40 minutes in the care of the patient today including preparing to see the patient, getting/reviewing separately obtained history, performing a medically appropriate exam/evaluation, counseling and educating, placing orders, documenting clinical information in the EHR, and independently interpreting results.   Follow-up: No follow-ups on file.   Sara LITTIE Kettering, MD

## 2024-10-14 NOTE — Patient Instructions (Addendum)
 Your diabetes is under poor control on current regimen,  likely due to  steroid use,  but  I cannot clear you for surgery until your blood sugars are better controlled.   We are increasing your glipizide  to a full tablet before dinner,  continue half tablet with breakfast  .  Please check  your blood sugar twice daily :   I am interested in fastings and 2 hour post  breakfast  or 2 HRS POST LUNCH  along with a brief description of the meal content related to the particular blood sugars.   BRING YOUR OLD SUGARS TO THE OFFICE ASAP. BRING THE NEW BLOOD SUGARS IN ONE WEEK (AFTER THE MEDICATION CHANGE)

## 2024-10-14 NOTE — Assessment & Plan Note (Signed)
 normal colonoscopy in 2023 .  5H!AA 24 hour urine was normal. . Symptoms have  imporved with d/c of metformin  and are likely due to IBS

## 2024-10-14 NOTE — Assessment & Plan Note (Signed)
 Home readings have been consistently < 130/80 on current regimen of losartan  100 mg daily.  No changes today

## 2024-10-14 NOTE — Assessment & Plan Note (Signed)
 She has been scheduled for ACDF C5-7 on Dec 7 by [facility] careers adviser. Preoperative eval today notes uncontrolled diabetes .

## 2024-10-14 NOTE — Assessment & Plan Note (Signed)
 Currently not well-controlled on minimal dose of glipizide  due to recurrent steroid doses over the ast 2 months and non adherence to low GI diet.  . Patient is reminded to schedule an annual eye exam and foot exam is normal today. Patient has no microalbuminuria.  Will increase evening dose of glipizide  to 5 mg   continue 2.5 mg in the am and review blood sugars weekly given tentative surgery date of Dec 7

## 2024-10-14 NOTE — Assessment & Plan Note (Signed)
 PR interval is 232 msec on today's EKG,  was 212 msec in 2023.  She is asymptomatic

## 2024-10-16 ENCOUNTER — Other Ambulatory Visit: Payer: Self-pay | Admitting: Internal Medicine

## 2024-10-18 ENCOUNTER — Telehealth: Payer: Self-pay | Admitting: Internal Medicine

## 2024-10-18 DIAGNOSIS — E1169 Type 2 diabetes mellitus with other specified complication: Secondary | ICD-10-CM

## 2024-10-18 NOTE — Telephone Encounter (Signed)
 noted

## 2024-10-18 NOTE — Telephone Encounter (Signed)
 Pt dropped off her blood sugar readings and placed in back mailbox.

## 2024-10-18 NOTE — Telephone Encounter (Unsigned)
 Copied from CRM [id number]. Topic: General - Other >> Oct 18, 2024  3:45 PM Franky GRADE wrote: Reason for CRM: Patient returning a call she received from [facility], advised patient of the corrected message left by Dr.Tullo, Patient understood and will increase the dose she's taking.

## 2024-10-18 NOTE — Telephone Encounter (Signed)
 I have placed in yellow results folder for review.

## 2024-10-18 NOTE — Telephone Encounter (Signed)
 LMTCB. Please relay corrected message to pt when she returns call to the office.

## 2024-10-18 NOTE — Assessment & Plan Note (Signed)
 Post prandial dinner sugars are still elevated on 5 mg dose,  to > 200.  Increase dose to 10 mg at dinner time

## 2024-10-20 DIAGNOSIS — E1122 Type 2 diabetes mellitus with diabetic chronic kidney disease: Secondary | ICD-10-CM | POA: Diagnosis not present

## 2024-10-20 DIAGNOSIS — E785 Hyperlipidemia, unspecified: Secondary | ICD-10-CM | POA: Diagnosis not present

## 2024-10-20 DIAGNOSIS — R809 Proteinuria, unspecified: Secondary | ICD-10-CM | POA: Diagnosis not present

## 2024-10-20 DIAGNOSIS — D631 Anemia in chronic kidney disease: Secondary | ICD-10-CM | POA: Diagnosis not present

## 2024-10-20 DIAGNOSIS — K219 Gastro-esophageal reflux disease without esophagitis: Secondary | ICD-10-CM | POA: Diagnosis not present

## 2024-10-20 DIAGNOSIS — M5459 Other low back pain: Secondary | ICD-10-CM | POA: Diagnosis not present

## 2024-10-20 DIAGNOSIS — N1831 Chronic kidney disease, stage 3a: Secondary | ICD-10-CM | POA: Diagnosis not present

## 2024-10-20 DIAGNOSIS — I10 Essential (primary) hypertension: Secondary | ICD-10-CM | POA: Diagnosis not present

## 2024-11-08 ENCOUNTER — Ambulatory Visit: Admit: 2024-11-08 | Admitting: Orthopedic Surgery

## 2024-11-08 SURGERY — ANTERIOR CERVICAL DECOMPRESSION/DISCECTOMY FUSION 2 LEVELS
Anesthesia: General

## 2024-11-12 ENCOUNTER — Other Ambulatory Visit: Payer: Self-pay | Admitting: Internal Medicine

## 2024-11-12 NOTE — Telephone Encounter (Signed)
 Per last office note pt was advised to increase her evening to 5 mg and continue her morning dose of 2.5. I have pended the rx with new directions and correct quantity.

## 2024-11-12 NOTE — Telephone Encounter (Signed)
 Copied from CRM [id number]. Topic: Clinical - Medication Refill >> Nov 12, 2024 10:39 AM Rosina BIRCH wrote: Medication: glipiZide  (can't be refilled until 12/23 but the provider has doubled her intake so she is out)   Has the patient contacted their pharmacy? Yes (Agent: If no, request that the patient contact the pharmacy for the refill. If patient does not wish to contact the pharmacy document the reason why and proceed with request.) (Agent: If yes, when and what did the pharmacy advise?)  This is the patient's preferred pharmacy:  [id number] [address] [facility] - [address] [place] [address] Phone: [phone] Fax: [phone] Is this the correct pharmacy for this prescription? Yes If no, delete pharmacy and type the correct one.   Has the prescription been filled recently? Yes  Is the patient out of the medication? Yes  Has the patient been seen for an appointment in the last year OR does the patient have an upcoming appointment? Yes  Can we respond through MyChart? Yes  Agent: Please be advised that Rx refills may take up to 3 business days. We ask that you follow-up with your pharmacy.  Patient would like to be called in regards to her blood sugar and EKG [phone]

## 2024-11-13 MED ORDER — GLIPIZIDE 5 MG PO TABS: ORAL_TABLET | ORAL | 0 refills | Status: AC

## 2024-11-15 ENCOUNTER — Encounter: Payer: Self-pay | Admitting: Internal Medicine

## 2024-11-18 NOTE — Telephone Encounter (Signed)
 LMTCB with Katheryn Dustman surgery scheduler with [facility] in  to find out why pt has not been able to schedule her surgery yet and if something else is needed from us .

## 2024-11-18 NOTE — Telephone Encounter (Signed)
 Called [facility] I was put on hold and then got disconnected.

## 2024-11-19 NOTE — Telephone Encounter (Signed)
 LMTCB with Katheryn Dustman, surgery coordinator.

## 2024-11-19 NOTE — Telephone Encounter (Unsigned)
 Copied from CRM #[id number]. Topic: General - Other >> Nov 19, 2024 10:55 AM Carlyon D wrote: Reason for CRM:  lori at [facility]  is calling in regards to a call from miss harlene , miss lori states pt is suppose to get surgery scheduled they have received cleearnace also called pt to get her scheduled pt is wanting to wait till mid or end jan. To be scheduled, so miss Katheryn is not sure what is going on??  any other questions feel free to give lori a call back [facility] [phone]

## 2024-11-19 NOTE — Telephone Encounter (Unsigned)
 Copied from CRM #[id number]. Topic: General - Other >> Nov 19, 2024  8:03 AM Laymon HERO wrote: Reason for CRM: [facility] [phone] asking for Harriette Raisin, CMA to return call

## 2024-11-30 NOTE — Telephone Encounter (Signed)
 noted

## 2024-12-13 ENCOUNTER — Telehealth: Payer: Self-pay | Admitting: *Deleted

## 2024-12-13 ENCOUNTER — Ambulatory Visit: Payer: Medicare Other

## 2024-12-13 VITALS — BP 138/79 | HR 70 | Ht 61.0 in | Wt 150.0 lb

## 2024-12-13 DIAGNOSIS — Z0000 Encounter for general adult medical examination without abnormal findings: Secondary | ICD-10-CM

## 2024-12-13 DIAGNOSIS — Z1231 Encounter for screening mammogram for malignant neoplasm of breast: Secondary | ICD-10-CM

## 2024-12-13 DIAGNOSIS — Z780 Asymptomatic menopausal state: Secondary | ICD-10-CM

## 2024-12-13 MED ORDER — LOSARTAN POTASSIUM 100 MG PO TABS: 100.0000 mg | ORAL_TABLET | Freq: Every day | ORAL | 1 refills | Status: AC

## 2024-12-13 NOTE — Telephone Encounter (Signed)
 Performed AWV While reviewing medications patient stated that she is taking Losartan  50 mg daily. Patient stated that is the dose that was last filled which does not match the medication list. Patient would like a call to confirm what dose she should be taking.

## 2024-12-13 NOTE — Progress Notes (Signed)
 "  Chief Complaint  Patient presents with   Medicare Wellness     Subjective:   Sara Mills is a 74 y.o. female who presents for a Medicare Annual Wellness Visit.  Visit info / Clinical Intake: Medicare Wellness Visit Type:: Subsequent Annual Wellness Visit Persons participating in visit and providing information:: patient Medicare Wellness Visit Mode:: Telephone If telephone:: video declined Since this visit was completed virtually, some vitals may be partially provided or unavailable. Missing vitals are due to the limitations of the virtual format.: Documented vitals are patient reported If Telephone or Video please confirm:: I connected with patient using audio/video enable telemedicine. I verified patient identity with two identifiers, discussed telehealth limitations, and patient agreed to proceed. Patient Location:: Home Provider Location:: Office/Home Interpreter Needed?: No Pre-visit prep was completed: yes AWV questionnaire completed by patient prior to visit?: no Living arrangements:: lives with spouse/significant other Patient's Overall Health Status Rating: good Typical amount of pain: (!) a lot (needs neck and back surgery) Does pain affect daily life?: (!) yes Are you currently prescribed opioids?: (!) yes  Dietary Habits and Nutritional Risks How many meals a day?: 3 Eats fruit and vegetables daily?: (!) no Most meals are obtained by: preparing own meals In the last 2 weeks, have you had any of the following?: (!) nausea, vomiting, diarrhea (diarrhea from medication doctor is aware) Diabetic:: (!) yes Any non-healing wounds?: no How often do you check your BS?: 2 Would you like to be referred to a Nutritionist or for Diabetic Management? : no  Functional Status Activities of Daily Living (to include ambulation/medication): Independent Ambulation: Independent Medication Administration: Independent Home Management (perform basic housework or laundry):  Independent Manage your own finances?: yes Primary transportation is: driving Concerns about vision?: no *vision screening is required for WTM* Concerns about hearing?: no  Fall Screening Falls in the past year?: 0 Number of falls in past year: 0 Was there an injury with Fall?: 0 Fall Risk Category Calculator: 0 Patient Fall Risk Level: Low Fall Risk  Fall Risk Patient at Risk for Falls Due to: No Fall Risks Fall risk Follow up: Falls evaluation completed; Falls prevention discussed  Home and Transportation Safety: All rugs have non-skid backing?: yes All stairs or steps have railings?: yes Grab bars in the bathtub or shower?: yes Have non-skid surface in bathtub or shower?: (!) no Good home lighting?: yes Regular seat belt use?: yes Hospital stays in the last year:: no  Cognitive Assessment Difficulty concentrating, remembering, or making decisions? : yes Will 6CIT or Mini Cog be Completed: yes What year is it?: 0 points What month is it?: 0 points Give patient an address phrase to remember (5 components): [address] [place] About what time is it?: 0 points Count backwards from 20 to 1: 0 points Say the months of the year in reverse: 0 points Repeat the address phrase from earlier: 0 points 6 CIT Score: 0 points  Advance Directives (For Healthcare) Does Patient Have a Medical Advance Directive?: Yes Does patient want to make changes to medical advance directive?: No - Patient declined Type of Advance Directive: Healthcare Power of [place]; Living will Copy of Healthcare Power of Attorney in Chart?: Yes - validated most recent copy scanned in chart (See row information) Copy of Living Will in Chart?: Yes - validated most recent copy scanned in chart (See row information)  Reviewed/Updated  Reviewed/Updated: Reviewed All (Medical, Surgical, Family, Medications, Allergies, Care Teams, Patient Goals)    Allergies (verified)  Rosuvastatin , Bee venom, and  Lisinopril    Current Medications (verified) Outpatient Encounter Medications as of 12/13/2024  Medication Sig   ezetimibe  (ZETIA ) 10 MG tablet TAKE 1 TABLET BY MOUTH EVERY DAY   gabapentin  (NEURONTIN ) 600 MG tablet TAKE 1 TABLET BY MOUTH TWICE A DAY   glipiZIDE  (GLUCOTROL ) 5 MG tablet Take 1 tablet(2.5 mg) in the morning and take 2 tablets(5 mg) in the evening.   glucose blood (ACCU-CHEK GUIDE) test strip USE 1 STRIP TO CHECK BLOOD GLUCOSE ONCE A DAY Dx code: E11.69   losartan  (COZAAR ) 100 MG tablet Take 1 tablet (100 mg total) by mouth daily. (Patient taking differently: Take 50 mg by mouth daily.)   traMADol  (ULTRAM ) 50 MG tablet Take 1 tablet (50 mg total) by mouth every 8 (eight) hours as needed.   valACYclovir  (VALTREX ) 500 MG tablet Take 1 tablet (500 mg total) by mouth 3 (three) times daily as needed (for herpes flare for 7 days then resume daily).   Vitamin D , Ergocalciferol , (DRISDOL) 1.25 MG (50000 UNIT) CAPS capsule Take 50,000 Units by mouth every 7 (seven) days.   zolpidem  (AMBIEN ) 10 MG tablet TAKE 1 TABLET (10 MG TOTAL) BY MOUTH AT BEDTIME AS NEEDED FOR SLEEP. FOR SLEEP   No facility-administered encounter medications on file as of 12/13/2024.    History: Past Medical History:  Diagnosis Date   Angioedema 06/08/2017   Arthritis    Diabetes mellitus    GERD (gastroesophageal reflux disease)    Hyperlipidemia    Hypertension    Past Surgical History:  Procedure Laterality Date   ABDOMINAL HYSTERECTOMY  1995   and BSO   APPENDECTOMY     BILATERAL OOPHORECTOMY     BREAST BIOPSY Right 02/15/2021   right breast calcs, x marker, BENIGN BREAST TISSUE WITH FIBROADENOMATOID CHANGES ANDASSOCIATED CALCIFICATIONS. - NEGATIVE FOR ATYPIA AND MALIGNANCY   BUNIONECTOMY     CARPAL TUNNEL RELEASE Right    CESAREAN SECTION  1977 and 1978   Family History  Problem Relation Age of Onset   Stroke Mother    Heart murmur Mother    Mitral valve prolapse Mother    Arthritis Mother     Heart disease Mother    Miscarriages / Stillbirths Mother    Varicose Veins Mother    Cancer Father 57       carcinoid syndrome   Stomach cancer Father    Diabetes Maternal Grandfather    Arthritis Paternal Grandmother    Asthma Brother    Breast cancer Neg Hx    Social History   Occupational History   Not on file  Tobacco Use   Smoking status: Never   Smokeless tobacco: Never  Vaping Use   Vaping status: Never Used  Substance and Sexual Activity   Alcohol use: No   Drug use: No   Sexual activity: Not on file   Tobacco Counseling Counseling given: Not Answered  SDOH Screenings   Food Insecurity: No Food Insecurity (12/13/2024)  Housing: Low Risk (12/13/2024)  Transportation Needs: No Transportation Needs (12/13/2024)  Utilities: Not At Risk (12/13/2024)  Alcohol Screen: Low Risk (12/13/2024)  Depression (PHQ2-9): Medium Risk (12/13/2024)  Financial Resource Strain: Low Risk (12/13/2024)  Physical Activity: Inactive (12/13/2024)  Social Connections: Moderately Integrated (12/13/2024)  Stress: No Stress Concern Present (12/13/2024)  Recent Concern: Stress - Stress Concern Present (10/13/2024)  Tobacco Use: Low Risk (12/13/2024)  Health Literacy: Adequate Health Literacy (12/13/2024)   See flowsheets for full screening details  Depression Screen PHQ 2 &  9 Depression Scale- Over the past 2 weeks, how often have you been bothered by any of the following problems? Little interest or pleasure in doing things: 0 Feeling down, depressed, or hopeless (PHQ Adolescent also includes...irritable): 0 PHQ-2 Total Score: 0 Trouble falling or staying asleep, or sleeping too much: 3 Feeling tired or having little energy: 1 Poor appetite or overeating (PHQ Adolescent also includes...weight loss): 1 Feeling bad about yourself - or that you are a failure or have let yourself or your family down: 0 Trouble concentrating on things, such as reading the newspaper or watching television (PHQ Adolescent also  includes...like school work): 1 Moving or speaking so slowly that other people could have noticed. Or the opposite - being so fidgety or restless that you have been moving around a lot more than usual: 0 Thoughts that you would be better off dead, or of hurting yourself in some way: 0 PHQ-9 Total Score: 6 If you checked off any problems, how difficult have these problems made it for you to do your work, take care of things at home, or get along with other people?: Not difficult at all     Goals Addressed             This Visit's Progress    Patient Stated       Wants to get down blood sugar and A1C             Objective:    Today's Vitals   12/13/24 1014  BP: 138/79  Pulse: 70  Weight: 150 lb (68 kg)  Height: 5' 1 (1.549 m)   Body mass index is 28.34 kg/m.  Hearing/Vision screen Hearing Screening - Comments:: No issues Vision Screening - Comments:: Glasses, [facility], up to date Immunizations and Health Maintenance Health Maintenance  Topic Date Due   Pneumococcal Vaccine: 50+ Years (3 of 3 - PCV20 or PCV21) 09/14/2020   COVID-19 Vaccine (4 - 2025-26 season) 08/09/2024   Mammogram  03/19/2025   HEMOGLOBIN A1C  04/11/2025   Diabetic kidney evaluation - Urine ACR  04/20/2025   FOOT EXAM  06/07/2025   OPHTHALMOLOGY EXAM  06/15/2025   Diabetic kidney evaluation - eGFR measurement  10/12/2025   Medicare Annual Wellness (AWV)  12/13/2025   Colonoscopy  09/03/2027   DTaP/Tdap/Td (4 - Td or Tdap) 02/24/2034   Influenza Vaccine  Completed   Bone Density Scan  Completed   Hepatitis C Screening  Completed   Zoster Vaccines- Shingrix  Completed   Meningococcal B Vaccine  Aged Out   Hepatitis B Vaccines 19-59 Average Risk  Discontinued        Assessment/Plan:  This is a routine wellness examination for Sara Mills.  Patient Care Team: Marylynn Verneita CROME, MD as PCP - General (Internal Medicine) [facility] , Roni Dominica Brandy, MD as  Consulting Physician (Nephrology) End, Lonni, MD as Consulting Physician (Cardiology) Burnetta Aures, MD as Consulting Physician (Orthopedic Surgery) Dominica Brandy, MD as Consulting Physician (Nephrology)  I have personally reviewed and noted the following in the patients chart:   Medical and social history Use of alcohol, tobacco or illicit drugs  Current medications and supplements including opioid prescriptions. Functional ability and status Nutritional status Physical activity Advanced directives List of other physicians Hospitalizations, surgeries, and ER visits in previous 12 months Vitals Screenings to include cognitive, depression, and falls Referrals and appointments  Orders Placed This Encounter  Procedures   MM 3D SCREENING MAMMOGRAM BILATERAL BREAST  Standing Status:   Future    Expiration Date:   12/13/2025    Reason for Exam (SYMPTOM  OR DIAGNOSIS REQUIRED):   need for cancer screening    Preferred imaging location?:   [facility]   DG Bone Density    Standing Status:   Future    Expiration Date:   12/13/2025    Reason for Exam (SYMPTOM  OR DIAGNOSIS REQUIRED):   postmenopausal, estrogen deficiency    Preferred imaging location?:   [facility]   In addition, I have reviewed and discussed with patient certain preventive protocols, quality metrics, and best practice recommendations. A written personalized care plan for preventive services as well as general preventive health recommendations were provided to patient.   Angeline Fredericks, LPN   07/12/7972   Return in 1 year (on 12/13/2025).  After Visit Summary: (MyChart) Due to this being a telephonic visit, the after visit summary with patients personalized plan was offered to patient via MyChart   Nurse Notes: Mammogram and Dexa ordered. Phone note sent to PCP regarding medication. Patient needs pneumonia vaccine at upcoming visit. Patient declines covid vaccine. "

## 2024-12-13 NOTE — Telephone Encounter (Signed)
 The rx for the 100 mg never got sent to pharmacy electronically so they pt has still be taking the 50 mg dose. Pt stated that she was not aware that she needed to increase to the 100 mg. Pharmacy verified that she last filled the 50 mg dose. Pt stated that her bp still seems to be staying on the higher side averaging 138/80s. Does pt need to continue with the 50 mg or increase to the 100 mg?

## 2024-12-13 NOTE — Patient Instructions (Signed)
 [name],  Thank you for taking the time for your Medicare Wellness Visit. I appreciate your continued commitment to your health goals. Please review the care plan we discussed, and feel free to reach out if I can assist you further.  Please note that Annual Wellness Visits do not include a physical exam. Some assessments may be limited, especially if the visit was conducted virtually. If needed, we may recommend an in-person follow-up with your provider.  Ongoing Care Seeing your primary care provider every 3 to 6 months helps us  monitor your health and provide consistent, personalized care.  Remember to get your pneumonia vaccine at your next office visit You have an order for:  []   2D Mammogram  [x]   3D Mammogram  [x]   Bone Density     Please call for appointment:  [facility]  [address] [place] [address] [phone]  Managing Pain Without Opioids Opioids are strong medicines used to treat moderate to severe pain. For some people, especially those who have long-term (chronic) pain, opioids may not be the best choice for pain management due to: Side effects like nausea, constipation, and sleepiness. The risk of addiction (opioid use disorder). The longer you take opioids, the greater your risk of addiction. Pain that lasts for more than 3 months is called chronic pain. Managing chronic pain usually requires more than one approach and is often provided by a team of health care providers working together (multidisciplinary approach). Pain management may be done at a pain management center or pain clinic. How to manage pain without the use of opioids Use non-opioid medicines Non-opioid medicines for pain may include: Over-the-counter or prescription non-steroidal anti-inflammatory drugs (NSAIDs). These may be the first medicines used for pain. They work well for muscle and bone pain, and they reduce swelling. Acetaminophen .  This over-the-counter medicine may work well for milder pain but not swelling. Antidepressants. These may be used to treat chronic pain. A certain type of antidepressant (tricyclics) is often used. These medicines are given in lower doses for pain than when used for depression. Anticonvulsants. These are usually used to treat seizures but may also reduce nerve (neuropathic) pain. Muscle relaxants. These relieve pain caused by sudden muscle tightening (spasms). You may also use a pain medicine that is applied to the skin as a patch, cream, or gel (topical analgesic), such as a numbing medicine. These may cause fewer side effects than medicines taken by mouth. Do certain therapies as directed Some therapies can help with pain management. They include: Physical therapy. You will do exercises to gain strength and flexibility. A physical therapist may teach you exercises to move and stretch parts of your body that are weak, stiff, or painful. You can learn these exercises at physical therapy visits and practice them at home. Physical therapy may also involve: Massage. Heat wraps or applying heat or cold to affected areas. Electrical signals that interrupt pain signals (transcutaneous electrical nerve stimulation, TENS). Weak lasers that reduce pain and swelling (low-level laser therapy). Signals from your body that help you learn to regulate pain (biofeedback). Occupational therapy. This helps you to learn ways to function at home and work with less pain. Recreational therapy. This involves trying new activities or hobbies, such as a physical activity or drawing. Mental health therapy, including: Cognitive behavioral therapy (CBT). This helps you learn coping skills for dealing with pain. Acceptance and commitment therapy (ACT) to change the way you think and react to  pain. Relaxation therapies, including muscle relaxation exercises and mindfulness-based stress reduction. Pain management counseling.  This may be individual, family, or group counseling.  Receive medical treatments Medical treatments for pain management include: Nerve block injections. These may include a pain blocker and anti-inflammatory medicines. You may have injections: Near the spine to relieve chronic back or neck pain. Into joints to relieve back or joint pain. Into nerve areas that supply a painful area to relieve body pain. Into muscles (trigger point injections) to relieve some painful muscle conditions. A medical device placed near your spine to help block pain signals and relieve nerve pain or chronic back pain (spinal cord stimulation device). Acupuncture. Follow these instructions at home Medicines Take over-the-counter and prescription medicines only as told by your health care provider. If you are taking pain medicine, ask your health care providers about possible side effects to watch out for. Do not drive or use heavy machinery while taking prescription opioid pain medicine. Lifestyle  Do not use drugs or alcohol to reduce pain. If you drink alcohol, limit how much you have to: 0-1 drink a day for women who are not pregnant. 0-2 drinks a day for men. Know how much alcohol is in a drink. In the [place] one drink equals one 12 oz bottle of beer (355 mL), one 5 oz glass of wine (148 mL), or one 1 oz glass of hard liquor (44 mL). Do not use any products that contain nicotine or tobacco. These products include cigarettes, chewing tobacco, and vaping devices, such as e-cigarettes. If you need help quitting, ask your health care provider. Eat a healthy diet and maintain a healthy weight. Poor diet and excess weight may make pain worse. Eat foods that are high in fiber. These include fresh fruits and vegetables, whole grains, and beans. Limit foods that are high in fat and processed sugars, such as fried and sweet foods. Exercise regularly. Exercise lowers stress and may help relieve pain. Ask your health care  provider what activities and exercises are safe for you. If your health care provider approves, join an exercise class that combines movement and stress reduction. Examples include yoga and tai chi. Get enough sleep. Lack of sleep may make pain worse. Lower stress as much as possible. Practice stress reduction techniques as told by your therapist. General instructions Work with all your pain management providers to find the treatments that work best for you. You are an important member of your pain management team. There are many things you can do to reduce pain on your own. Consider joining an online or in-person support group for people who have chronic pain. Keep all follow-up visits. This is important. Where to find more information You can find more information about managing pain without opioids from: American Academy of Pain Medicine: painmed.org Institute for Chronic Pain: instituteforchronicpain.org American Chronic Pain Association: theacpa.org Contact a health care provider if: You have side effects from pain medicine. Your pain gets worse or does not get better with treatments or home therapy. You are struggling with anxiety or depression. Summary Many types of pain can be managed without opioids. Chronic pain may respond better to pain management without opioids. Pain is best managed when you and a team of health care providers work together. Pain management without opioids may include non-opioid medicines, medical treatments, physical therapy, mental health therapy, and lifestyle changes. Tell your health care providers if your pain gets worse or is not being managed well enough. This information is not intended  to replace advice given to you by your health care provider. Make sure you discuss any questions you have with your health care provider. Document Revised: 03/07/2021 Document Reviewed: 03/07/2021 Elsevier Patient Education  2024 Elsevier Inc.   Make sure to wear  two-piece clothing.  No lotions, powders, or deodorants the day of the appointment. Make sure to bring picture ID and insurance card.  Bring list of medications you are currently taking including any supplements.  .  Referrals If a referral was made during today's visit and you haven't received any updates within two weeks, please contact the referred provider directly to check on the status.  Recommended Screenings:  Health Maintenance  Topic Date Due   Pneumococcal Vaccine for age over 91 (3 of 3 - PCV20 or PCV21) 09/14/2020   COVID-19 Vaccine (4 - 2025-26 season) 08/09/2024   Breast Cancer Screening  03/19/2025   Hemoglobin A1C  04/11/2025   Yearly kidney health urinalysis for diabetes  04/20/2025   Complete foot exam   06/07/2025   Eye exam for diabetics  06/15/2025   Yearly kidney function blood test for diabetes  10/12/2025   Medicare Annual Wellness Visit  12/13/2025   Colon Cancer Screening  09/03/2027   DTaP/Tdap/Td vaccine (4 - Td or Tdap) 02/24/2034   Flu Shot  Completed   Osteoporosis screening with Bone Density Scan  Completed   Hepatitis C Screening  Completed   Zoster (Shingles) Vaccine  Completed   Meningitis B Vaccine  Aged Out   Hepatitis B Vaccine  Discontinued       12/13/2024   10:21 AM  Advanced Directives  Does Patient Have a Medical Advance Directive? Yes  Type of Estate Agent of [place];Living will  Does patient want to make changes to medical advance directive? No - Patient declined  Copy of Healthcare Power of Attorney in Chart? Yes - validated most recent copy scanned in chart (See row information)    Vision: Annual vision screenings are recommended for early detection of glaucoma, cataracts, and diabetic retinopathy. These exams can also reveal signs of chronic conditions such as diabetes and high blood pressure.  Dental: Annual dental screenings help detect early signs of oral cancer, gum disease, and other conditions  linked to overall health, including heart disease and diabetes.  Please see the attached documents for additional preventive care recommendations.

## 2024-12-14 NOTE — Progress Notes (Unsigned)
 " Cardiology Office Note:  .   Date:  12/15/2024  ID:  Monta LITTIE Molt, DOB 1950/12/26, MRN [id number] PCP: [name], MD  [facility] Providers Cardiologist:  None     History of Present Illness: .   Discussed the use of AI scribe software for clinical note transcription with the patient, who gave verbal consent to proceed.  KRYSTI HICKLING is a 74 y.o. female with history of hypertension, hyperlipidemia, and type 2 diabetes mellitus, who presents for evaluation of abnormal EKG (first-degree AV block and poor R wave progression) obtained by Dr. Marylynn in anticipation of cervical spine surgery. Ms. Otterson was scheduled for anterior cervical discectomy and fusion (ACDF) surgery with Dr. Burnetta kussmaul month, which was postponed due to concerns about her EKG results. She experiences long-standing shortness of breath with exertion, such as walking and talking. No chest pain, tightness, or pressure. No leg swelling, palpitations, or significant lightheadedness, although she occasionally feels woozy when standing up quickly. She experiences significant neck and back pain due to arthritis and degenerative disc disease, affecting her entire neck and radiating down her back. This pain impacts her sleep, and she notes that her left arm goes numb when she lies down. These issues have persisted for several years.  Ms. Barrientes' diabetes management has been challenging, with her A1c rising from 6.9 in May to 8.2 in October, potentially influenced by prednisone  treatment for a severe case of poison oak in September. She finds it difficult to control her diabetes, especially during the holiday season. She is allergic to lisinopril , which caused allergic reactions requiring emergency care, and has experienced muscle aches with rosuvastatin , which her kidney doctor advised against. She currently takes losartan  and ezetimibe  without issues.     ROS: See HPI  Studies Reviewed: SABRA   EKG  Interpretation Date/Time:  Wednesday December 15 2024 09:08:37 EST Ventricular Rate:  64 PR Interval:  222 QRS Duration:  76 QT Interval:  390 QTC Calculation: 402 R Axis:   -22  Text Interpretation: Sinus rhythm with sinus arrhythmia with 1st degree A-V block Low voltage QRS Possible Septal infarct , age undetermined When compared with ECG of 02-Jun-2022 01:48, Premature atrial complexes are no longer Present Confirmed by Lianne Carreto, Lonni [phone]) on 12/15/2024 9:11:14 AM    Exercise MPI (01/26/2014): Low risk study without evidence of ischemia or scar.  Risk Assessment/Calculations:             Physical Exam:   VS:  BP 132/80 (BP Location: Left Arm, Patient Position: Sitting, Cuff Size: Normal)   Pulse 64   Ht 5' 1 (1.549 m)   Wt 150 lb 6.4 oz (68.2 kg)   SpO2 97%   BMI 28.42 kg/m    Wt Readings from Last 3 Encounters:  12/15/24 150 lb 6.4 oz (68.2 kg)  12/13/24 150 lb (68 kg)  10/14/24 151 lb (68.5 kg)    General:  NAD. Neck: No JVD or HJR. Lungs: Clear to auscultation bilaterally without wheezes or crackles. Heart: Regular rate and rhythm with 2/6 holosystolic murmur. Abdomen: Soft, nontender, nondistended. Extremities: No lower extremity edema.  ASSESSMENT AND PLAN: .    Dyspnea on exertion and abnormal EKG: Ms. Watts reports a long history of dyspnea with modest exertion, though she continues to walk on a treadmill.  Her EKG today is notable for first-degree AV block (longstanding) and poor R wave progression.  Given her multiple cardiac risk factors including hypertension, hyperlipidemia, type 2 diabetes mellitus, and  age greater than 32, I think it would be prudent to obtain an echocardiogram and exercise myocardial perfusion stress test.  Heart murmur: 2/6 systolic murmur appreciated on examination today.  Patient has chronic exertional dyspnea but no other signs or symptoms of heart failure.  Will obtain an echocardiogram.  First-degree AV block: Mild PR  prolongation noted back to 2015 with minimal progression since then.  No symptoms to suggest high-grade AV block reported.  Continue periodic EKG monitoring; AV nodal blocking agents should be avoided if possible.  Hypertension associated with type 2 diabetes mellitus: Blood pressure borderline elevated today.  Continue monitoring; defer medication changes today.  Management of DM per Dr. Marylynn, recently uncontrolled in the setting of having received corticosteroids for neck/back pain and poison ivy exposure this fall.  Preoperative cardiovascular risk assessment: Ms. Ostrom does not have any unstable symptoms.  However, given her chronic dyspnea on exertion and abnormal EKG detailed above, we will obtain an echocardiogram and exercise myocardial perfusion stress test for further risk stratification before Ms. Tieken moves forward with elective surgery.    Informed Consent   Shared Decision Making/Informed Consent The risks [chest pain, shortness of breath, cardiac arrhythmias, dizziness, blood pressure fluctuations, myocardial infarction, stroke/transient ischemic attack, nausea, vomiting, allergic reaction, radiation exposure, metallic taste sensation and life-threatening complications (estimated to be 1 in 10,000)], benefits (risk stratification, diagnosing coronary artery disease, treatment guidance) and alternatives of a nuclear stress test were discussed in detail with Ms. Blethen and she agrees to proceed.     Dispo: Return to clinic in 3 months.  Signed, Lonni Hanson, MD  "

## 2024-12-14 NOTE — Telephone Encounter (Signed)
 Pt has seen [employer] and responded

## 2024-12-15 ENCOUNTER — Ambulatory Visit: Attending: Internal Medicine | Admitting: Internal Medicine

## 2024-12-15 ENCOUNTER — Encounter: Payer: Self-pay | Admitting: Internal Medicine

## 2024-12-15 VITALS — BP 132/80 | HR 64 | Ht 61.0 in | Wt 150.4 lb

## 2024-12-15 DIAGNOSIS — R011 Cardiac murmur, unspecified: Secondary | ICD-10-CM | POA: Diagnosis not present

## 2024-12-15 DIAGNOSIS — R0609 Other forms of dyspnea: Secondary | ICD-10-CM | POA: Diagnosis not present

## 2024-12-15 DIAGNOSIS — I440 Atrioventricular block, first degree: Secondary | ICD-10-CM | POA: Diagnosis not present

## 2024-12-15 DIAGNOSIS — E1159 Type 2 diabetes mellitus with other circulatory complications: Secondary | ICD-10-CM

## 2024-12-15 DIAGNOSIS — I152 Hypertension secondary to endocrine disorders: Secondary | ICD-10-CM

## 2024-12-15 DIAGNOSIS — R9431 Abnormal electrocardiogram [ECG] [EKG]: Secondary | ICD-10-CM | POA: Diagnosis not present

## 2024-12-15 DIAGNOSIS — Z01818 Encounter for other preprocedural examination: Secondary | ICD-10-CM | POA: Diagnosis not present

## 2024-12-15 NOTE — Patient Instructions (Signed)
 Medication Instructions:  Your physician recommends that you continue on your current medications as directed. Please refer to the Current Medication list given to you today.    *If you need a refill on your cardiac medications before your next appointment, please call your pharmacy*  Lab Work: No labs ordered today    Testing/Procedures: Your physician has requested that you have an echocardiogram. Echocardiography is a painless test that uses sound waves to create images of your heart. It provides your doctor with information about the size and shape of your heart and how well your hearts chambers and valves are working.   You may receive an ultrasound enhancing agent through an IV if needed to better visualize your heart during the echo. This procedure takes approximately one hour.  There are no restrictions for this procedure.  This will take place at [address], [place] [address]  Your provider has ordered a Exercise Myoview Stress test. This will take place at [facility]. Please report to the [facility] medical mall entrance. The volunteers at the first desk will direct you where to go.  [facility] MYOVIEW  Your provider has ordered a Stress Test with nuclear imaging. The purpose of this test is to evaluate the blood supply to your heart muscle. This procedure is referred to as a Non-Invasive Stress Test. This is because other than having an IV started in your vein, nothing is inserted or invades your body. Cardiac stress tests are done to find areas of poor blood flow to the heart by determining the extent of coronary artery disease (CAD). Some patients exercise on a treadmill, which naturally increases the blood flow to your heart, while others who are unable to walk on a treadmill due to physical limitations will have a pharmacologic/chemical stress agent called Lexiscan . This medicine will mimic walking on a treadmill by temporarily increasing your coronary  blood flow.   Please note: these test may take anywhere between 2-4 hours to complete  How to prepare for your Myoview test:  Nothing to eat for 6 hours prior to the test No caffeine for 24 hours prior to test No smoking 24 hours prior to test. Your medication may be taken with water. Ladies, please do not wear dresses.  Skirts or pants are appropriate. Please wear a short sleeve shirt. No perfume, cologne or lotion. Wear comfortable walking shoes. No heels!   PLEASE NOTIFY THE OFFICE AT LEAST 24 HOURS IN ADVANCE IF YOU ARE UNABLE TO KEEP YOUR APPOINTMENT.  [phone] AND  PLEASE NOTIFY NUCLEAR MEDICINE AT [facility] AT LEAST 24 HOURS IN ADVANCE IF YOU ARE UNABLE TO KEEP YOUR APPOINTMENT. [phone]   Please note: We ask at that you not bring children with you during ultrasound (echo/ vascular) testing. Due to room size and safety concerns, children are not allowed in the ultrasound rooms during exams. Our front office staff cannot provide observation of children in our lobby area while testing is being conducted. An adult accompanying a patient to their appointment will only be allowed in the ultrasound room at the discretion of the ultrasound technician under special circumstances. We apologize for any inconvenience.   Follow-Up: At [facility], you and your health needs are our priority.  As part of our continuing mission to provide you with exceptional heart care, our providers are all part of one team.  This team includes your primary Cardiologist (physician) and Advanced Practice Providers or APPs (Physician Assistants and Nurse Practitioners) who  all work together to provide you with the care you need, when you need it.  Your next appointment:   3 month(s)  Provider:   You may see Lonni Hanson, MD or one of the following Advanced Practice Providers on your designated Care Team:   Lonni Meager, NP Lesley Maffucci, PA-C Bernardino Bring, PA-C Cadence Lismore, PA-C Tylene Lunch, NP Barnie Hila, NP

## 2024-12-20 ENCOUNTER — Ambulatory Visit
Admission: RE | Admit: 2024-12-20 | Discharge: 2024-12-20 | Disposition: A | Discharge status: Home / Self Care | Source: Ambulatory Visit | Attending: Internal Medicine | Admitting: Internal Medicine

## 2024-12-20 DIAGNOSIS — Z01818 Encounter for other preprocedural examination: Secondary | ICD-10-CM | POA: Diagnosis present

## 2024-12-20 DIAGNOSIS — R0609 Other forms of dyspnea: Secondary | ICD-10-CM | POA: Diagnosis present

## 2024-12-20 DIAGNOSIS — R9431 Abnormal electrocardiogram [ECG] [EKG]: Secondary | ICD-10-CM | POA: Insufficient documentation

## 2024-12-20 LAB — NM MYOCAR MULTI W/SPECT W/WALL MOTION / EF
Angina Index: 0
Duke Treadmill Score: 6
Estimated workload: 7
Exercise duration (min): 6 min
Exercise duration (sec): 13 s
LV dias vol: 59 mL (ref 46–106)
LV sys vol: 16 mL
MPHR: 149 {beats}/min
Nuc Stress EF: 73 %
Peak HR: 137 {beats}/min
Percent HR: 91 %
Rest HR: 57 {beats}/min
Rest Nuclear Isotope Dose: 10.6 mCi
SDS: 0
SRS: 0
SSS: 0
ST Depression (mm): 0 mm
Stress Nuclear Isotope Dose: 31.8 mCi
TID: 0.89

## 2024-12-20 MED ORDER — TECHNETIUM TC 99M TETROFOSMIN IV KIT
10.6200 | PACK | Freq: Once | INTRAVENOUS | Status: AC | PRN
  Administered 2024-12-20: 10.62 via INTRAVENOUS

## 2024-12-20 MED ORDER — TECHNETIUM TC 99M TETROFOSMIN IV KIT
37.8200 | PACK | Freq: Once | INTRAVENOUS | Status: AC | PRN
  Administered 2024-12-20: 31.82 via INTRAVENOUS

## 2024-12-21 ENCOUNTER — Ambulatory Visit: Payer: Self-pay | Admitting: Internal Medicine

## 2025-01-03 ENCOUNTER — Ambulatory Visit: Payer: PRIVATE HEALTH INSURANCE

## 2025-01-11 ENCOUNTER — Ambulatory Visit

## 2025-01-11 DIAGNOSIS — Z01818 Encounter for other preprocedural examination: Secondary | ICD-10-CM | POA: Diagnosis not present

## 2025-01-11 DIAGNOSIS — R011 Cardiac murmur, unspecified: Secondary | ICD-10-CM | POA: Diagnosis not present

## 2025-01-11 DIAGNOSIS — R9431 Abnormal electrocardiogram [ECG] [EKG]: Secondary | ICD-10-CM

## 2025-01-11 DIAGNOSIS — R0609 Other forms of dyspnea: Secondary | ICD-10-CM | POA: Diagnosis not present

## 2025-01-11 LAB — ECHOCARDIOGRAM COMPLETE
AR max vel: 2.57 cm2
AV Area VTI: 2.36 cm2
AV Area mean vel: 2.16 cm2
AV Mean grad: 4 mmHg
AV Peak grad: 6.5 mmHg
Ao pk vel: 1.27 m/s
Area-P 1/2: 3.27 cm2
S' Lateral: 2 cm

## 2025-01-14 ENCOUNTER — Ambulatory Visit: Admitting: Internal Medicine

## 2025-01-14 ENCOUNTER — Encounter: Payer: Self-pay | Admitting: Internal Medicine

## 2025-01-14 VITALS — BP 124/78 | HR 55 | Temp 97.2°F | Ht 61.5 in | Wt 155.8 lb

## 2025-01-14 DIAGNOSIS — G959 Disease of spinal cord, unspecified: Secondary | ICD-10-CM | POA: Insufficient documentation

## 2025-01-14 DIAGNOSIS — N1831 Chronic kidney disease, stage 3a: Secondary | ICD-10-CM

## 2025-01-14 DIAGNOSIS — Z23 Encounter for immunization: Secondary | ICD-10-CM

## 2025-01-14 DIAGNOSIS — E1169 Type 2 diabetes mellitus with other specified complication: Secondary | ICD-10-CM

## 2025-01-14 DIAGNOSIS — M5126 Other intervertebral disc displacement, lumbar region: Secondary | ICD-10-CM

## 2025-01-14 LAB — COMPREHENSIVE METABOLIC PANEL WITH GFR
ALT: 36 U/L — ABNORMAL HIGH (ref 3–35)
AST: 26 U/L (ref 5–37)
Albumin: 4.5 g/dL (ref 3.5–5.2)
Alkaline Phosphatase: 73 U/L (ref 39–117)
BUN: 17 mg/dL (ref 6–23)
CO2: 29 meq/L (ref 19–32)
Calcium: 9.6 mg/dL (ref 8.4–10.5)
Chloride: 101 meq/L (ref 96–112)
Creatinine, Ser: 1.16 mg/dL (ref 0.40–1.20)
GFR: 47.44 mL/min — ABNORMAL LOW
Glucose, Bld: 130 mg/dL — ABNORMAL HIGH (ref 70–99)
Potassium: 3.8 meq/L (ref 3.5–5.1)
Sodium: 138 meq/L (ref 135–145)
Total Bilirubin: 0.7 mg/dL (ref 0.2–1.2)
Total Protein: 7.2 g/dL (ref 6.0–8.3)

## 2025-01-14 LAB — LIPID PANEL
Cholesterol: 212 mg/dL — ABNORMAL HIGH (ref 28–200)
HDL: 41.3 mg/dL
LDL Cholesterol: 104 mg/dL — ABNORMAL HIGH (ref 10–99)
NonHDL: 170.65
Total CHOL/HDL Ratio: 5
Triglycerides: 335 mg/dL — ABNORMAL HIGH (ref 10.0–149.0)
VLDL: 67 mg/dL — ABNORMAL HIGH (ref 0.0–40.0)

## 2025-01-14 LAB — HEMOGLOBIN A1C: Hgb A1c MFr Bld: 7.4 % — ABNORMAL HIGH (ref 4.6–6.5)

## 2025-01-14 LAB — LDL CHOLESTEROL, DIRECT: Direct LDL: 120 mg/dL

## 2025-01-14 MED ORDER — GABAPENTIN 600 MG PO TABS: 300.0000 mg | ORAL_TABLET | Freq: Two times a day (BID) | ORAL | 1 refills | Status: AC

## 2025-01-14 MED ORDER — EZETIMIBE 10 MG PO TABS: 10.0000 mg | ORAL_TABLET | Freq: Every day | ORAL | 1 refills | Status: AC

## 2025-01-14 NOTE — Assessment & Plan Note (Signed)
 Her diabets was uncontrolled in November largely due to  nonadherence to diabetic diet.  Her post prandial dinner sugars  which remained > 200 on  5 mg dose of glipizide .  She has been taking 10 mg in the evening since mid November and states that her average glucose was been 135. Repeat A1c today

## 2025-01-14 NOTE — Patient Instructions (Signed)
 Please send me some 2 hour post prandial blood sugard readings if you have them

## 2025-01-14 NOTE — Progress Notes (Signed)
 "  Subjective:  Patient ID: Sara Mills, female    DOB: 1951-03-03  Age: 74 y.o. MRN: [id number]  CC: The primary encounter diagnosis was Need for pneumococcal 20-valent conjugate vaccination. Diagnoses of Low back pain due to displacement of intervertebral disc, Hyperlipidemia due to type 2 diabetes mellitus (HCC), Cervical myelopathy (HCC), and Stage 3a chronic kidney disease (HCC) were also pertinent to this visit.   HPI TANJA GIFT presents for  Chief Complaint  Patient presents with   Medical Management of Chronic Issues    3 month    last seen Nov 6.  Diabetes was uncontrolled and ACDF surgery was postponed due to abnormal EKG.  Had myoview  and ECHO  no issues.  /normal EF .  Greenlight or surgery but weather not cooperating   2) type 2 DM: taking glipizie  2.5 mg in am and 10 mg with dinner per last visit in November.  Eating poorly  but 90 day average 135.  Foot exam done.  Had eye exam in November due to dissatisfaction with recent eyeglass   3) cervical myelopathy:  still confllicted about changing from orthopedist who has been her MD for > 5 yrs to  to neurosurgery   Lab Results  Component Value Date   HGBA1C 8.2 (H) 10/12/2024  ACDF surg  Outpatient Medications Prior to Visit  Medication Sig Dispense Refill   glucose blood (ACCU-CHEK GUIDE) test strip USE 1 STRIP TO CHECK BLOOD GLUCOSE ONCE A DAY Dx code: E11.69 50 strip 1   losartan  (COZAAR ) 100 MG tablet Take 1 tablet (100 mg total) by mouth daily. 90 tablet 1   traMADol  (ULTRAM ) 50 MG tablet Take 1 tablet (50 mg total) by mouth every 8 (eight) hours as needed. 90 tablet 1   valACYclovir  (VALTREX ) 500 MG tablet Take 1 tablet (500 mg total) by mouth 3 (three) times daily as needed (for herpes flare for 7 days then resume daily). 44 tablet 2   Vitamin D , Ergocalciferol , (DRISDOL) 1.25 MG (50000 UNIT) CAPS capsule Take 50,000 Units by mouth every 7 (seven) days.     zolpidem  (AMBIEN ) 10 MG tablet TAKE 1 TABLET (10 MG  TOTAL) BY MOUTH AT BEDTIME AS NEEDED FOR SLEEP. FOR SLEEP 30 tablet 2   glipiZIDE  (GLUCOTROL ) 5 MG tablet Take 1 tablet(2.5 mg) in the morning and take 2 tablets(5 mg) in the evening. 270 tablet 0   ezetimibe  (ZETIA ) 10 MG tablet TAKE 1 TABLET BY MOUTH EVERY DAY 90 tablet 1   gabapentin  (NEURONTIN ) 600 MG tablet TAKE 1 TABLET BY MOUTH TWICE A DAY (Patient taking differently: Take 300 mg by mouth 2 (two) times daily.) 60 tablet 0   No facility-administered medications prior to visit.    Review of Systems;  Patient denies headache, fevers, malaise, unintentional weight loss, skin rash, eye pain, sinus congestion and sinus pain, sore throat, dysphagia,  hemoptysis , cough, dyspnea, wheezing, chest pain, palpitations, orthopnea, edema, abdominal pain, nausea, melena, diarrhea, constipation, flank pain, dysuria, hematuria, urinary  Frequency, nocturia, numbness, tingling, seizures,  Focal weakness, Loss of consciousness,  Tremor, insomnia, depression, anxiety, and suicidal ideation.      Objective:  BP 124/78 (BP Location: Left Arm)   Pulse (!) 55   Temp (!) 97.2 F (36.2 C)   Ht 5' 1.5 (1.562 m)   Wt 155 lb 12.8 oz (70.7 kg)   SpO2 98%   BMI 28.96 kg/m   BP Readings from Last 3 Encounters:  01/14/25 124/78  12/15/24  132/80  12/13/24 138/79    Wt Readings from Last 3 Encounters:  01/14/25 155 lb 12.8 oz (70.7 kg)  12/15/24 150 lb 6.4 oz (68.2 kg)  12/13/24 150 lb (68 kg)    Physical Exam Vitals reviewed.  Constitutional:      General: She is not in acute distress.    Appearance: Normal appearance. She is normal weight. She is not ill-appearing, toxic-appearing or diaphoretic.  HENT:     Head: Normocephalic.  Eyes:     General: No scleral icterus.       Right eye: No discharge.        Left eye: No discharge.     Conjunctiva/sclera: Conjunctivae normal.  Cardiovascular:     Rate and Rhythm: Normal rate and regular rhythm.     Heart sounds: Normal heart sounds.   Pulmonary:     Effort: Pulmonary effort is normal. No respiratory distress.     Breath sounds: Normal breath sounds.  Musculoskeletal:        General: Normal range of motion.  Skin:    General: Skin is warm and dry.  Neurological:     General: No focal deficit present.     Mental Status: She is alert and oriented to person, place, and time. Mental status is at baseline.  Psychiatric:        Mood and Affect: Mood normal.        Behavior: Behavior normal.        Thought Content: Thought content normal.        Judgment: Judgment normal.     Lab Results  Component Value Date   HGBA1C 8.2 (H) 10/12/2024   HGBA1C 6.9 (H) 04/20/2024   HGBA1C 7.5 (H) 09/08/2023    Lab Results  Component Value Date   CREATININE 1.06 10/12/2024   CREATININE 0.99 04/20/2024   CREATININE 0.91 09/08/2023    Lab Results  Component Value Date   WBC 8.6 09/08/2023   HGB 13.1 09/08/2023   HCT 40.3 09/08/2023   PLT 151.0 09/08/2023   GLUCOSE 134 (H) 10/12/2024   CHOL 229 (H) 10/12/2024   TRIG 332.0 (H) 10/12/2024   HDL 45.00 10/12/2024   LDLDIRECT 124.0 10/12/2024   LDLCALC 117 (H) 10/12/2024   ALT 31 10/12/2024   AST 22 10/12/2024   NA 138 10/12/2024   K 3.8 10/12/2024   CL 102 10/12/2024   CREATININE 1.06 10/12/2024   BUN 16 10/12/2024   CO2 27 10/12/2024   TSH 1.26 10/12/2024   INR 1.1 06/02/2022   HGBA1C 8.2 (H) 10/12/2024   MICROALBUR 1.0 04/20/2024    NM Myocar Multi W/Spect W/Wall Motion / EF Result Date: 12/20/2024   The study is normal. The study is low risk.   No ST deviation was noted. poor quality EKG   LV perfusion is normal. There is no evidence of ischemia. There is no evidence of infarction.   Left ventricular function is normal. End diastolic cavity size is normal. End systolic cavity size is normal.   Coronary calcium  was absent on the attenuation correction CT images.    Assessment & Plan:   Problem List Items Addressed This Visit     Cervical myelopathy (HCC)    SHE HAS Severe disc degeneration C5-7. Significant bilateral foraminal stenosis at C5-6 affecting the exiting c6 nerve root,   Significant right C3-4 foraminal narrowing,. Moderate to severe foraminal narrowing at C6-7 left side worse than right.  Her arms have become weak and she wakes up  bilaterally numb.  Her surgery was postponed due to uncontrolled type 2 diabetes and abnormal EKG.  She has been evaluated by Cardiology with ECHO and Nuc Med study which were  both normal andhas been cleared to reschedule ACDF .  She remains undecided about who will be doing the surgery       Hyperlipidemia due to type 2 diabetes mellitus (HCC)   Her diabets was uncontrolled in November largely due to  nonadherence to diabetic diet.  Her post prandial dinner sugars  which remained > 200 on  5 mg dose of glipizide .  She has been taking 10 mg in the evening since mid November and states that her average glucose was been 135. Repeat A1c today       Relevant Medications   ezetimibe  (ZETIA ) 10 MG tablet   Low back pain due to displacement of intervertebral disc   Relevant Medications   gabapentin  (NEURONTIN ) 600 MG tablet   Stage 3a chronic kidney disease (HCC)    Her GFR is Stable,  etiology is multifactorial  Due to DM,  HTN and NSAID use. GFR improved after stopping hydrochlorothiazide . Nephrology  follow up reviewed  with Korapati ;  continue ARB  100 mg for goal BP 120/70.    Lab Results  Component Value Date   CREATININE 1.06 10/12/2024         Other Visit Diagnoses       Need for pneumococcal 20-valent conjugate vaccination    -  Primary   Relevant Orders   Pneumococcal conjugate vaccine 20-valent (Prevnar 20) (Completed)          I spent 34 minutes on the day of this face to face encounter reviewing patient's  most recent visit with cardiology,  nephrology,  and neurosurgery, ,  prior relevant surgical and non surgical procedures, recent  labs and imaging studies, counseling on diabetes  management,  reviewing the assessment and plan with patient, and post visit ordering and reviewing of  diagnostics and therapeutics with patient  .   Follow-up: Return in about 3 months (around 04/15/2025) for follow up diabetes.   Verneita LITTIE Kettering, MD "

## 2025-01-14 NOTE — Assessment & Plan Note (Signed)
 SHE HAS Severe disc degeneration C5-7. Significant bilateral foraminal stenosis at C5-6 affecting the exiting c6 nerve root,   Significant right C3-4 foraminal narrowing,. Moderate to severe foraminal narrowing at C6-7 left side worse than right.  Her arms have become weak and she wakes up bilaterally numb.  Her surgery was postponed due to uncontrolled type 2 diabetes and abnormal EKG.  She has been evaluated by Cardiology with ECHO and Nuc Med study which were  both normal andhas been cleared to reschedule ACDF .  She remains undecided about who will be doing the surgery

## 2025-01-14 NOTE — Assessment & Plan Note (Signed)
"   Her GFR is Stable,  etiology is multifactorial  Due to DM,  HTN and NSAID use. GFR improved after stopping hydrochlorothiazide . Nephrology  follow up reviewed  with Korapati ;  continue ARB  100 mg for goal BP 120/70.    Lab Results  Component Value Date   CREATININE 1.06 10/12/2024    "

## 2025-01-31 ENCOUNTER — Ambulatory Visit (HOSPITAL_COMMUNITY): Admit: 2025-01-31 | Admitting: Orthopedic Surgery

## 2025-03-21 ENCOUNTER — Encounter

## 2025-03-21 ENCOUNTER — Ambulatory Visit: Payer: PRIVATE HEALTH INSURANCE | Admitting: Student

## 2025-03-21 ENCOUNTER — Other Ambulatory Visit

## 2025-04-14 ENCOUNTER — Ambulatory Visit: Admitting: Internal Medicine

## 2025-12-14 ENCOUNTER — Ambulatory Visit
